# Patient Record
Sex: Male | Born: 1950
Health system: Southern US, Community
[De-identification: ages and names within clinical notes are randomized; demographics above are authoritative.]

## PROBLEM LIST (undated history)

## (undated) DIAGNOSIS — C61 Malignant neoplasm of prostate: Secondary | ICD-10-CM

## (undated) DIAGNOSIS — N400 Enlarged prostate without lower urinary tract symptoms: Secondary | ICD-10-CM

## (undated) DIAGNOSIS — I10 Essential (primary) hypertension: Secondary | ICD-10-CM

## (undated) DIAGNOSIS — M1990 Unspecified osteoarthritis, unspecified site: Secondary | ICD-10-CM

## (undated) DIAGNOSIS — K432 Incisional hernia without obstruction or gangrene: Secondary | ICD-10-CM

## (undated) HISTORY — DX: Malignant neoplasm of prostate: C61

## (undated) HISTORY — PX: HERNIA REPAIR: SHX51

## (undated) HISTORY — DX: Essential (primary) hypertension: I10

## (undated) HISTORY — PX: UMBILICAL HERNIA REPAIR: SHX196

## (undated) HISTORY — DX: Benign prostatic hyperplasia without lower urinary tract symptoms: N40.0

## (undated) HISTORY — PX: INGUINAL HERNIA REPAIR: SUR1180

---

## 2009-12-29 DIAGNOSIS — N400 Enlarged prostate without lower urinary tract symptoms: Secondary | ICD-10-CM

## 2009-12-29 HISTORY — DX: Benign prostatic hyperplasia without lower urinary tract symptoms: N40.0

## 2013-08-05 ENCOUNTER — Encounter: Payer: Self-pay | Admitting: Internal Medicine

## 2013-08-05 ENCOUNTER — Ambulatory Visit (INDEPENDENT_AMBULATORY_CARE_PROVIDER_SITE_OTHER): Payer: BC Managed Care – PPO | Admitting: Internal Medicine

## 2013-08-05 ENCOUNTER — Other Ambulatory Visit (INDEPENDENT_AMBULATORY_CARE_PROVIDER_SITE_OTHER): Payer: BC Managed Care – PPO

## 2013-08-05 VITALS — BP 116/72 | HR 67 | Temp 98.2°F | Resp 16 | Ht 70.0 in | Wt 203.0 lb

## 2013-08-05 DIAGNOSIS — Z0000 Encounter for general adult medical examination without abnormal findings: Secondary | ICD-10-CM

## 2013-08-05 DIAGNOSIS — Z23 Encounter for immunization: Secondary | ICD-10-CM

## 2013-08-05 DIAGNOSIS — C61 Malignant neoplasm of prostate: Secondary | ICD-10-CM | POA: Insufficient documentation

## 2013-08-05 DIAGNOSIS — I10 Essential (primary) hypertension: Secondary | ICD-10-CM

## 2013-08-05 DIAGNOSIS — N400 Enlarged prostate without lower urinary tract symptoms: Secondary | ICD-10-CM

## 2013-08-05 LAB — URINALYSIS, ROUTINE W REFLEX MICROSCOPIC
Bilirubin Urine: NEGATIVE
Hgb urine dipstick: NEGATIVE
Ketones, ur: NEGATIVE
Nitrite: NEGATIVE
Total Protein, Urine: NEGATIVE
Urine Glucose: NEGATIVE
pH: 6 (ref 5.0–8.0)

## 2013-08-05 LAB — COMPREHENSIVE METABOLIC PANEL
Albumin: 4.4 g/dL (ref 3.5–5.2)
Alkaline Phosphatase: 64 U/L (ref 39–117)
BUN: 17 mg/dL (ref 6–23)
CO2: 29 mEq/L (ref 19–32)
Calcium: 9.7 mg/dL (ref 8.4–10.5)
Chloride: 104 mEq/L (ref 96–112)
Glucose, Bld: 96 mg/dL (ref 70–99)
Potassium: 4.5 mEq/L (ref 3.5–5.1)
Sodium: 140 mEq/L (ref 135–145)
Total Protein: 7.2 g/dL (ref 6.0–8.3)

## 2013-08-05 LAB — LIPID PANEL
Cholesterol: 175 mg/dL (ref 0–200)
LDL Cholesterol: 117 mg/dL — ABNORMAL HIGH (ref 0–99)
Total CHOL/HDL Ratio: 4
Triglycerides: 46 mg/dL (ref 0.0–149.0)
VLDL: 9.2 mg/dL (ref 0.0–40.0)

## 2013-08-05 LAB — CBC WITH DIFFERENTIAL/PLATELET
Eosinophils Absolute: 0.1 10*3/uL (ref 0.0–0.7)
Eosinophils Relative: 1.2 % (ref 0.0–5.0)
Lymphocytes Relative: 13.1 % (ref 12.0–46.0)
MCV: 87.5 fl (ref 78.0–100.0)
Monocytes Absolute: 0.5 10*3/uL (ref 0.1–1.0)
Neutrophils Relative %: 77.8 % — ABNORMAL HIGH (ref 43.0–77.0)
Platelets: 189 10*3/uL (ref 150.0–400.0)
WBC: 6.8 10*3/uL (ref 4.5–10.5)

## 2013-08-05 LAB — HEPATITIS C ANTIBODY: HCV Ab: NEGATIVE

## 2013-08-05 MED ORDER — AMLODIPINE BESYLATE 10 MG PO TABS: 10.0000 mg | ORAL_TABLET | Freq: Every day | ORAL | Status: DC

## 2013-08-05 MED ORDER — BENAZEPRIL HCL 20 MG PO TABS: 20.0000 mg | ORAL_TABLET | Freq: Every day | ORAL | Status: DC

## 2013-08-05 MED ORDER — HYDROCHLOROTHIAZIDE 25 MG PO TABS: 25.0000 mg | ORAL_TABLET | Freq: Every day | ORAL | Status: DC

## 2013-08-05 NOTE — Progress Notes (Signed)
Subjective:    Patient ID: Corey [name], male    DOB: 09-12-1951, 62 y.o.   MRN: [id number]  HPI Comments: New to me for a complete physical. His prior care was delivered in [place] - no old records are available today.  Hypertension This is a chronic problem. The current episode started more than 1 year ago. The problem has been gradually improving since onset. The problem is controlled. Pertinent negatives include no anxiety, blurred vision, chest pain, headaches, malaise/fatigue, neck pain, orthopnea, palpitations, peripheral edema, PND, shortness of breath or sweats. There are no associated agents to hypertension. Past treatments include ACE inhibitors, calcium channel blockers and diuretics. The current treatment provides significant improvement. There are no compliance problems.       Review of Systems  Constitutional: Negative.  Negative for fever, chills, malaise/fatigue, diaphoresis, activity change, appetite change, fatigue and unexpected weight change.  HENT: Negative.  Negative for neck pain.   Eyes: Negative.  Negative for blurred vision.  Respiratory: Negative.  Negative for apnea, cough, choking, chest tightness, shortness of breath, wheezing and stridor.   Cardiovascular: Negative.  Negative for chest pain, palpitations, orthopnea, leg swelling and PND.  Gastrointestinal: Negative.  Negative for nausea, vomiting, abdominal pain, diarrhea and constipation.  Endocrine: Negative.   Genitourinary: Negative.  Negative for dysuria, urgency, frequency, hematuria, flank pain, decreased urine volume, discharge, penile swelling, scrotal swelling, enuresis, difficulty urinating, genital sores, penile pain and testicular pain.  Musculoskeletal: Negative.   Skin: Negative.   Allergic/Immunologic: Negative.   Neurological: Negative.  Negative for dizziness and headaches.  Hematological: Negative.  Negative for adenopathy. Does not bruise/bleed easily.  Psychiatric/Behavioral: Negative.         Objective:   Physical Exam  Vitals reviewed. Constitutional: He is oriented to person, place, and time. He appears well-developed and well-nourished. No distress.  HENT:  Head: Normocephalic and atraumatic.  Mouth/Throat: Oropharynx is clear and moist. No oropharyngeal exudate.  Eyes: Conjunctivae are normal. Right eye exhibits no discharge. Left eye exhibits no discharge. No scleral icterus.  Neck: Normal range of motion. Neck supple. No JVD present. No tracheal deviation present. No thyromegaly present.  Cardiovascular: Normal rate, regular rhythm, normal heart sounds and intact distal pulses.  Exam reveals no gallop and no friction rub.   No murmur heard. Pulmonary/Chest: Effort normal and breath sounds normal. No stridor. No respiratory distress. He has no wheezes. He has no rales. He exhibits no tenderness.  Abdominal: Soft. Bowel sounds are normal. He exhibits no distension and no mass. There is no tenderness. There is no rebound and no guarding. Hernia confirmed negative in the right inguinal area and confirmed negative in the left inguinal area.  Genitourinary: Rectum normal, testes normal and penis normal. Rectal exam shows no external hemorrhoid, no internal hemorrhoid, no fissure, no mass, no tenderness and anal tone normal. Guaiac negative stool. Prostate is enlarged (2+ smooth symm BPH). Prostate is not tender. Right testis shows no mass, no swelling and no tenderness. Right testis is descended. Left testis shows no mass, no swelling and no tenderness. Left testis is descended. Circumcised. No penile erythema or penile tenderness. No discharge found.  Musculoskeletal: Normal range of motion. He exhibits no edema and no tenderness.  Lymphadenopathy:    He has no cervical adenopathy.       Right: No inguinal adenopathy present.       Left: No inguinal adenopathy present.  Neurological: He is oriented to person, place, and time.  Skin: Skin is warm and  dry. No rash noted. He  is not diaphoretic. No erythema. No pallor.  Psychiatric: He has a normal mood and affect. His behavior is normal. Judgment and thought content normal.      No results found for this basename: WBC, HGB, HCT, PLT, GLUCOSE, CHOL, TRIG, HDL, LDLDIRECT, LDLCALC, ALT, AST, NA, K, CL, CREATININE, BUN, CO2, TSH, PSA, INR, GLUF, HGBA1C, MICROALBUR       Assessment & Plan:

## 2013-08-05 NOTE — Assessment & Plan Note (Signed)
I will recheck his PSA today and will advise further if needed

## 2013-08-05 NOTE — Assessment & Plan Note (Signed)
He will have records sent from his prior providers He was referred for a colonoscopy Vaccines were updated Labs ordered Pt ed material was given

## 2013-08-05 NOTE — Assessment & Plan Note (Signed)
His BP is well controlled Today I will check his lytes and renal fucntion

## 2013-08-05 NOTE — Patient Instructions (Addendum)
Hypertension As your heart beats, it forces blood through your arteries. This force is your blood pressure. If the pressure is too high, it is called hypertension (HTN) or high blood pressure. HTN is dangerous because you may have it and not know it. High blood pressure may mean that your heart has to work harder to pump blood. Your arteries may be narrow or stiff. The extra work puts you at risk for heart disease, stroke, and other problems.  Blood pressure consists of two numbers, a higher number over a lower, 110/72, for example. It is stated as "110 over 72." The ideal is below 120 for the top number (systolic) and under 80 for the bottom (diastolic). Write down your blood pressure today. You should pay close attention to your blood pressure if you have certain conditions such as:  Heart failure.  Prior heart attack.  Diabetes  Chronic kidney disease.  Prior stroke.  Multiple risk factors for heart disease. To see if you have HTN, your blood pressure should be measured while you are seated with your arm held at the level of the heart. It should be measured at least twice. A one-time elevated blood pressure reading (especially in the Emergency Department) does not mean that you need treatment. There may be conditions in which the blood pressure is different between your right and left arms. It is important to see your caregiver soon for a recheck. Most people have essential hypertension which means that there is not a specific cause. This type of high blood pressure may be lowered by changing lifestyle factors such as:  Stress.  Smoking.  Lack of exercise.  Excessive weight.  Drug/tobacco/alcohol use.  Eating less salt. Most people do not have symptoms from high blood pressure until it has caused damage to the body. Effective treatment can often prevent, delay or reduce that damage. TREATMENT  When a cause has been identified, treatment for high blood pressure is directed at the  cause. There are a large number of medications to treat HTN. These fall into several categories, and your caregiver will help you select the medicines that are best for you. Medications may have side effects. You should review side effects with your caregiver. If your blood pressure stays high after you have made lifestyle changes or started on medicines,   Your medication(s) may need to be changed.  Other problems may need to be addressed.  Be certain you understand your prescriptions, and know how and when to take your medicine.  Be sure to follow up with your caregiver within the time frame advised (usually within two weeks) to have your blood pressure rechecked and to review your medications.  If you are taking more than one medicine to lower your blood pressure, make sure you know how and at what times they should be taken. Taking two medicines at the same time can result in blood pressure that is too low. SEEK IMMEDIATE MEDICAL CARE IF:  You develop a severe headache, blurred or changing vision, or confusion.  You have unusual weakness or numbness, or a faint feeling.  You have severe chest or abdominal pain, vomiting, or breathing problems. MAKE SURE YOU:   Understand these instructions.  Will watch your condition.  Will get help right away if you are not doing well or get worse. Document Released: 12/15/2005 Document Revised: 03/08/2012 Document Reviewed: 08/04/2008 [facility] Patient Information 2014 [place]. Health Maintenance, Males A healthy lifestyle and preventative care can promote health and wellness.  Maintain  regular health, dental, and eye exams.  Eat a healthy diet. Foods like vegetables, fruits, whole grains, low-fat dairy products, and lean protein foods contain the nutrients you need without too many calories. Decrease your intake of foods high in solid fats, added sugars, and salt. Get information about a proper diet from your caregiver, if  necessary.  Regular physical exercise is one of the most important things you can do for your health. Most adults should get at least 150 minutes of moderate-intensity exercise (any activity that increases your heart rate and causes you to sweat) each week. In addition, most adults need muscle-strengthening exercises on 2 or more days a week.   Maintain a healthy weight. The body mass index (BMI) is a screening tool to identify possible weight problems. It provides an estimate of body fat based on height and weight. Your caregiver can help determine your BMI, and can help you achieve or maintain a healthy weight. For adults 20 years and older:  A BMI below 18.5 is considered underweight.  A BMI of 18.5 to 24.9 is normal.  A BMI of 25 to 29.9 is considered overweight.  A BMI of 30 and above is considered obese.  Maintain normal blood lipids and cholesterol by exercising and minimizing your intake of saturated fat. Eat a balanced diet with plenty of fruits and vegetables. Blood tests for lipids and cholesterol should begin at age 70 and be repeated every 5 years. If your lipid or cholesterol levels are high, you are over 50, or you are a high risk for heart disease, you may need your cholesterol levels checked more frequently.Ongoing high lipid and cholesterol levels should be treated with medicines, if diet and exercise are not effective.  If you smoke, find out from your caregiver how to quit. If you do not use tobacco, do not start.  If you choose to drink alcohol, do not exceed 2 drinks per day. One drink is considered to be 12 ounces (355 mL) of beer, 5 ounces (148 mL) of wine, or 1.5 ounces (44 mL) of liquor.  Avoid use of street drugs. Do not share needles with anyone. Ask for help if you need support or instructions about stopping the use of drugs.  High blood pressure causes heart disease and increases the risk of stroke. Blood pressure should be checked at least every 1 to 2 years.  Ongoing high blood pressure should be treated with medicines if weight loss and exercise are not effective.  If you are 25 to 62 years old, ask your caregiver if you should take aspirin to prevent heart disease.  Diabetes screening involves taking a blood sample to check your fasting blood sugar level. This should be done once every 3 years, after age 34, if you are within normal weight and without risk factors for diabetes. Testing should be considered at a younger age or be carried out more frequently if you are overweight and have at least 1 risk factor for diabetes.  Colorectal cancer can be detected and often prevented. Most routine colorectal cancer screening begins at the age of 93 and continues through age 76. However, your caregiver may recommend screening at an earlier age if you have risk factors for colon cancer. On a yearly basis, your caregiver may provide home test kits to check for hidden blood in the stool. Use of a small camera at the end of a tube, to directly examine the colon (sigmoidoscopy or colonoscopy), can detect the earliest forms of colorectal  cancer. Talk to your caregiver about this at age 91, when routine screening begins. Direct examination of the colon should be repeated every 5 to 10 years through age 36, unless early forms of pre-cancerous polyps or small growths are found.  Hepatitis C blood testing is recommended for all people born from [birth year] through 1965 and any individual with known risks for hepatitis C.  Healthy men should no longer receive prostate-specific antigen (PSA) blood tests as part of routine cancer screening. Consult with your caregiver about prostate cancer screening.  Testicular cancer screening is not recommended for adolescents or adult males who have no symptoms. Screening includes self-exam, caregiver exam, and other screening tests. Consult with your caregiver about any symptoms you have or any concerns you have about testicular  cancer.  Practice safe sex. Use condoms and avoid high-risk sexual practices to reduce the spread of sexually transmitted infections (STIs).  Use sunscreen with a sun protection factor (SPF) of 30 or greater. Apply sunscreen liberally and repeatedly throughout the day. You should seek shade when your shadow is shorter than you. Protect yourself by wearing long sleeves, pants, a wide-brimmed hat, and sunglasses year round, whenever you are outdoors.  Notify your caregiver of new moles or changes in moles, especially if there is a change in shape or color. Also notify your caregiver if a mole is larger than the size of a pencil eraser.  A one-time screening for abdominal aortic aneurysm (AAA) and surgical repair of large AAAs by sound wave imaging (ultrasonography) is recommended for ages 21 to 13 years who are current or former smokers.  Stay current with your immunizations. Document Released: 06/12/2008 Document Revised: 03/08/2012 Document Reviewed: 05/12/2011 [facility] Patient Information 2014 [place], [place].

## 2013-12-29 DIAGNOSIS — C61 Malignant neoplasm of prostate: Secondary | ICD-10-CM

## 2013-12-29 HISTORY — DX: Malignant neoplasm of prostate: C61

## 2014-07-12 ENCOUNTER — Other Ambulatory Visit: Payer: Self-pay | Admitting: Internal Medicine

## 2014-07-12 NOTE — Telephone Encounter (Signed)
Need follow up appt soon

## 2014-10-10 ENCOUNTER — Other Ambulatory Visit: Payer: Self-pay

## 2015-12-30 HISTORY — PX: TRANSURETHRAL RESECTION OF PROSTATE: SHX73

## 2015-12-30 HISTORY — PX: PROSTATECTOMY: SHX69

## 2016-03-11 LAB — HM COLONOSCOPY: HM Colonoscopy: NORMAL

## 2016-07-11 ENCOUNTER — Encounter: Payer: Self-pay | Admitting: Internal Medicine

## 2016-07-11 ENCOUNTER — Ambulatory Visit (INDEPENDENT_AMBULATORY_CARE_PROVIDER_SITE_OTHER): Payer: PRIVATE HEALTH INSURANCE | Admitting: Internal Medicine

## 2016-07-11 ENCOUNTER — Telehealth: Payer: Self-pay | Admitting: Internal Medicine

## 2016-07-11 ENCOUNTER — Other Ambulatory Visit (INDEPENDENT_AMBULATORY_CARE_PROVIDER_SITE_OTHER): Payer: PRIVATE HEALTH INSURANCE

## 2016-07-11 VITALS — BP 124/70 | HR 70 | Temp 98.4°F | Ht 70.0 in | Wt 199.2 lb

## 2016-07-11 DIAGNOSIS — I10 Essential (primary) hypertension: Secondary | ICD-10-CM

## 2016-07-11 DIAGNOSIS — E785 Hyperlipidemia, unspecified: Secondary | ICD-10-CM | POA: Diagnosis not present

## 2016-07-11 DIAGNOSIS — C61 Malignant neoplasm of prostate: Secondary | ICD-10-CM

## 2016-07-11 DIAGNOSIS — Z0000 Encounter for general adult medical examination without abnormal findings: Secondary | ICD-10-CM

## 2016-07-11 LAB — CBC WITH DIFFERENTIAL/PLATELET
BASOS ABS: 0 10*3/uL (ref 0.0–0.1)
Basophils Relative: 0.4 % (ref 0.0–3.0)
Eosinophils Absolute: 0.2 10*3/uL (ref 0.0–0.7)
Eosinophils Relative: 3.3 % (ref 0.0–5.0)
HEMATOCRIT: 43.3 % (ref 39.0–52.0)
Hemoglobin: 15 g/dL (ref 13.0–17.0)
LYMPHS PCT: 9.2 % — AB (ref 12.0–46.0)
Lymphs Abs: 0.5 10*3/uL — ABNORMAL LOW (ref 0.7–4.0)
MCHC: 34.6 g/dL (ref 30.0–36.0)
MCV: 86.8 fl (ref 78.0–100.0)
MONOS PCT: 8.4 % (ref 3.0–12.0)
Monocytes Absolute: 0.5 10*3/uL (ref 0.1–1.0)
NEUTROS ABS: 4.6 10*3/uL (ref 1.4–7.7)
Neutrophils Relative %: 78.7 % — ABNORMAL HIGH (ref 43.0–77.0)
PLATELETS: 168 10*3/uL (ref 150.0–400.0)
RBC: 4.98 Mil/uL (ref 4.22–5.81)
RDW: 13.1 % (ref 11.5–15.5)
WBC: 5.9 10*3/uL (ref 4.0–10.5)

## 2016-07-11 LAB — TSH: TSH: 1.49 u[IU]/mL (ref 0.35–4.50)

## 2016-07-11 LAB — COMPREHENSIVE METABOLIC PANEL
ALT: 31 U/L (ref 0–53)
AST: 84 U/L — ABNORMAL HIGH (ref 0–37)
Albumin: 4.5 g/dL (ref 3.5–5.2)
Alkaline Phosphatase: 81 U/L (ref 39–117)
BILIRUBIN TOTAL: 0.8 mg/dL (ref 0.2–1.2)
BUN: 20 mg/dL (ref 6–23)
CALCIUM: 9.7 mg/dL (ref 8.4–10.5)
CHLORIDE: 102 meq/L (ref 96–112)
CO2: 30 meq/L (ref 19–32)
Creatinine, Ser: 0.88 mg/dL (ref 0.40–1.50)
GFR: 92.39 mL/min (ref >60.00)
Glucose, Bld: 98 mg/dL (ref 70–99)
Potassium: 4.4 mEq/L (ref 3.5–5.1)
Sodium: 140 mEq/L (ref 135–145)
Total Protein: 7.2 g/dL (ref 6.0–8.3)

## 2016-07-11 LAB — LIPID PANEL
Cholesterol: 200 mg/dL (ref 0–200)
HDL: 77.7 mg/dL (ref >39.00)
LDL CALC: 114 mg/dL — AB (ref 0–99)
NonHDL: 122.53
TRIGLYCERIDES: 45 mg/dL (ref 0.0–149.0)
Total CHOL/HDL Ratio: 3
VLDL: 9 mg/dL (ref 0.0–40.0)

## 2016-07-11 LAB — PSA: PSA: 0 ng/mL — ABNORMAL LOW (ref 0.10–4.00)

## 2016-07-11 MED ORDER — BENAZEPRIL HCL 20 MG PO TABS: 20.0000 mg | ORAL_TABLET | Freq: Every day | ORAL | Status: DC

## 2016-07-11 MED ORDER — AMLODIPINE BESYLATE 10 MG PO TABS: 10.0000 mg | ORAL_TABLET | Freq: Every day | ORAL | Status: DC

## 2016-07-11 MED ORDER — HYDROCHLOROTHIAZIDE 25 MG PO TABS: 25.0000 mg | ORAL_TABLET | Freq: Every day | ORAL | Status: DC

## 2016-07-11 NOTE — Patient Instructions (Signed)
Hypertension Hypertension, commonly called high blood pressure, is when the force of blood pumping through your arteries is too strong. Your arteries are the blood vessels that carry blood from your heart throughout your body. A blood pressure reading consists of a higher number over a lower number, such as 110/72. The higher number (systolic) is the pressure inside your arteries when your heart pumps. The lower number (diastolic) is the pressure inside your arteries when your heart relaxes. Ideally you want your blood pressure below 120/80. Hypertension forces your heart to work harder to pump blood. Your arteries may become narrow or stiff. Having untreated or uncontrolled hypertension can cause heart attack, stroke, kidney disease, and other problems. RISK FACTORS Some risk factors for high blood pressure are controllable. Others are not.  Risk factors you cannot control include:   Race. You may be at higher risk if you are African American.  Age. Risk increases with age.  Gender. Men are at higher risk than women before age 45 years. After age 65, women are at higher risk than men. Risk factors you can control include:  Not getting enough exercise or physical activity.  Being overweight.  Getting too much fat, sugar, calories, or salt in your diet.  Drinking too much alcohol. SIGNS AND SYMPTOMS Hypertension does not usually cause signs or symptoms. Extremely high blood pressure (hypertensive crisis) may cause headache, anxiety, shortness of breath, and nosebleed. DIAGNOSIS To check if you have hypertension, your health care provider will measure your blood pressure while you are seated, with your arm held at the level of your heart. It should be measured at least twice using the same arm. Certain conditions can cause a difference in blood pressure between your right and left arms. A blood pressure reading that is higher than normal on one occasion does not mean that you need treatment. If  it is not clear whether you have high blood pressure, you may be asked to return on a different day to have your blood pressure checked again. Or, you may be asked to monitor your blood pressure at home for 1 or more weeks. TREATMENT Treating high blood pressure includes making lifestyle changes and possibly taking medicine. Living a healthy lifestyle can help lower high blood pressure. You may need to change some of your habits. Lifestyle changes may include:  Following the DASH diet. This diet is high in fruits, vegetables, and whole grains. It is low in salt, red meat, and added sugars.  Keep your sodium intake below 2,300 mg per day.  Getting at least 30-45 minutes of aerobic exercise at least 4 times per week.  Losing weight if necessary.  Not smoking.  Limiting alcoholic beverages.  Learning ways to reduce stress. Your health care provider may prescribe medicine if lifestyle changes are not enough to get your blood pressure under control, and if one of the following is true:  You are 18-59 years of age and your systolic blood pressure is above 140.  You are 60 years of age or older, and your systolic blood pressure is above 150.  Your diastolic blood pressure is above 90.  You have diabetes, and your systolic blood pressure is over 140 or your diastolic blood pressure is over 90.  You have kidney disease and your blood pressure is above 140/90.  You have heart disease and your blood pressure is above 140/90. Your personal target blood pressure may vary depending on your medical conditions, your age, and other factors. HOME CARE INSTRUCTIONS    Have your blood pressure rechecked as directed by your health care provider.   Take medicines only as directed by your health care provider. Follow the directions carefully. Blood pressure medicines must be taken as prescribed. The medicine does not work as well when you skip doses. Skipping doses also puts you at risk for  problems.  Do not smoke.   Monitor your blood pressure at home as directed by your health care provider. SEEK MEDICAL CARE IF:   You think you are having a reaction to medicines taken.  You have recurrent headaches or feel dizzy.  You have swelling in your ankles.  You have trouble with your vision. SEEK IMMEDIATE MEDICAL CARE IF:  You develop a severe headache or confusion.  You have unusual weakness, numbness, or feel faint.  You have severe chest or abdominal pain.  You vomit repeatedly.  You have trouble breathing. MAKE SURE YOU:   Understand these instructions.  Will watch your condition.  Will get help right away if you are not doing well or get worse.   This information is not intended to replace advice given to you by your health care provider. Make sure you discuss any questions you have with your health care provider.   Document Released: 12/15/2005 Document Revised: 05/01/2015 Document Reviewed: 10/07/2013 [employer]vier Interactive Patient Education 2016 [employer]  

## 2016-07-11 NOTE — Progress Notes (Signed)
LVM for pt to call back as soon as possible.   

## 2016-07-11 NOTE — Telephone Encounter (Signed)
Did you call patient.  States got a missed call at 12:30.  Patient is requesting you to leave a message in regard if you call back.

## 2016-07-11 NOTE — Progress Notes (Signed)
Subjective:  Patient ID: Corey [name], male    DOB: [birth year]  Age: 65 y.o. MRN: [id number]  CC: Annual Exam and Hypertension   HPI Corey [name] presents for a CPX.  I last saw him about 3 years ago. He subsequently moved to [place], [place] where he developed prostate cancer and underwent prostatectomy and was treated with chemotherapy and radiation. He last saw his urologist in [place] about 3 months ago and his PSA was down to 0.01. He comes in today requesting that his PSA be repeated. He is hoping that he gets an undetectable level.  He tells me his blood pressure has been well controlled with the combination of amlodipine, ACEI, and hydrochlorothiazide. He is very active and denies chest pain, shortness of breath, DOE, palpitations, edema, fatigue, or near-syncope.  Outpatient Prescriptions Prior to Visit  Medication Sig Dispense Refill  . amLODipine (NORVASC) 10 MG tablet TAKE 1 TABLET BY MOUTH DAILY. 90 tablet 0  . benazepril (LOTENSIN) 20 MG tablet TAKE 1 TABLET BY MOUTH DAILY. 90 tablet 0  . hydrochlorothiazide (HYDRODIURIL) 25 MG tablet TAKE 1 TABLET BY MOUTH DAILY. 90 tablet 0   No facility-administered medications prior to visit.    ROS Review of Systems  Constitutional: Negative.  Negative for appetite change and fatigue.  HENT: Negative.   Eyes: Negative.   Respiratory: Negative.  Negative for cough, choking, chest tightness, shortness of breath and stridor.   Cardiovascular: Negative.  Negative for chest pain, palpitations and leg swelling.  Gastrointestinal: Negative.  Negative for nausea, vomiting, abdominal pain, diarrhea and constipation.  Endocrine: Negative.   Genitourinary: Positive for enuresis. Negative for frequency, hematuria and difficulty urinating.       He developed mild urinary incontinence after the prostatectomy  Musculoskeletal: Negative.  Negative for myalgias, back pain and neck pain.  Skin: Negative.  Negative for color change and rash.    Neurological: Negative.  Negative for dizziness and weakness.  Hematological: Negative.  Negative for adenopathy. Does not bruise/bleed easily.  Psychiatric/Behavioral: Negative.     Objective:  BP 124/70 mmHg  Pulse 70  Temp(Src) 98.4 F (36.9 C) (Oral)  Ht 5' 10"  (1.778 m)  Wt 199 lb 4 oz (90.379 kg)  BMI 28.59 kg/m2  SpO2 97%  BP Readings from Last 3 Encounters:  07/11/16 124/70  08/05/13 116/72    Wt Readings from Last 3 Encounters:  07/11/16 199 lb 4 oz (90.379 kg)  08/05/13 203 lb (92.08 kg)    Physical Exam  Constitutional: He is oriented to person, place, and time. No distress.  HENT:  Mouth/Throat: Oropharynx is clear and moist. No oropharyngeal exudate.  Eyes: Conjunctivae are normal. Right eye exhibits no discharge. Left eye exhibits no discharge. No scleral icterus.  Neck: Normal range of motion. Neck supple. No JVD present. No tracheal deviation present. No thyromegaly present.  Cardiovascular: Normal rate, regular rhythm, normal heart sounds and intact distal pulses.  Exam reveals no gallop and no friction rub.   No murmur heard. Pulmonary/Chest: Effort normal and breath sounds normal. No stridor. No respiratory distress. He has no wheezes. He has no rales. He exhibits no tenderness.  Abdominal: Soft. Bowel sounds are normal. He exhibits no distension and no mass. There is no tenderness. There is no rebound and no guarding.  Genitourinary: Circumcised.  GU and rectal exams were deferred at his request since he saw his urologist about 3 months ago and had a colonoscopy about 4 months ago  Musculoskeletal: Normal range of motion.  He exhibits no edema or tenderness.  Lymphadenopathy:    He has no cervical adenopathy.  Neurological: He is oriented to person, place, and time.  Skin: Skin is warm and dry. No rash noted. He is not diaphoretic. No erythema. No pallor.  Vitals reviewed.   Lab Results  Component Value Date   WBC 5.9 07/11/2016   HGB 15.0  07/11/2016   HCT 43.3 07/11/2016   PLT 168.0 07/11/2016   GLUCOSE 98 07/11/2016   CHOL 200 07/11/2016   TRIG 45.0 07/11/2016   HDL 77.70 07/11/2016   LDLCALC 114* 07/11/2016   ALT 31 07/11/2016   AST 84* 07/11/2016   NA 140 07/11/2016   K 4.4 07/11/2016   CL 102 07/11/2016   CREATININE 0.88 07/11/2016   BUN 20 07/11/2016   CO2 30 07/11/2016   TSH 1.49 07/11/2016   PSA 0.00* 07/11/2016    Patient was never admitted.  Assessment & Plan:   Corey [name] was seen today for annual exam and hypertension.  Diagnoses and all orders for this visit:  Essential hypertension, benign- his blood pressure is adequately well-controlled, electrolytes and renal function are WNL. -     Comprehensive metabolic panel; Future -     CBC with Differential/Platelet; Future -     amLODipine (NORVASC) 10 MG tablet; Take 1 tablet (10 mg total) by mouth daily. -     benazepril (LOTENSIN) 20 MG tablet; Take 1 tablet (20 mg total) by mouth daily. -     hydrochlorothiazide (HYDRODIURIL) 25 MG tablet; Take 1 tablet (25 mg total) by mouth daily.  Prostate cancer ([facility])- his PSA is undetectable, indicating no recurrence of prostate cancer. -     PSA; Future  Hyperlipidemia with target LDL less than 130- his Framingham risk score is 8%, he has achieved his LDL goal and at this time is not interested in starting statin therapy. -     Lipid panel; Future -     TSH; Future  I have changed Corey [name]'s amLODipine, benazepril, and hydrochlorothiazide. I am also having him maintain his multivitamin and Naproxen Sodium (ALEVE PO).  Meds ordered this encounter  Medications  . Multiple Vitamin (MULTIVITAMIN) tablet    Sig: Take 1 tablet by mouth daily.  . Naproxen Sodium (ALEVE PO)    Sig: Take by mouth.  Marland Kitchen amLODipine (NORVASC) 10 MG tablet    Sig: Take 1 tablet (10 mg total) by mouth daily.    Dispense:  90 tablet    Refill:  1  . benazepril (LOTENSIN) 20 MG tablet    Sig: Take 1 tablet (20 mg total) by mouth  daily.    Dispense:  90 tablet    Refill:  1  . hydrochlorothiazide (HYDRODIURIL) 25 MG tablet    Sig: Take 1 tablet (25 mg total) by mouth daily.    Dispense:  90 tablet    Refill:  1     Follow-up: Return in about 6 months (around 01/11/2017).  Scarlette Calico, MD

## 2016-07-11 NOTE — Telephone Encounter (Signed)
Left detailed message. Noted in labs tab as well.

## 2016-07-12 ENCOUNTER — Encounter: Payer: Self-pay | Admitting: Internal Medicine

## 2016-07-12 NOTE — Assessment & Plan Note (Signed)
Exam completed. Labs ordered and reviewed. His colonoscopy is up-to-date. Vaccines were reviewed. Patient education material was given.

## 2016-09-23 ENCOUNTER — Ambulatory Visit (INDEPENDENT_AMBULATORY_CARE_PROVIDER_SITE_OTHER): Payer: Medicare HMO | Admitting: Internal Medicine

## 2016-09-23 ENCOUNTER — Encounter: Payer: Self-pay | Admitting: Internal Medicine

## 2016-09-23 VITALS — BP 130/76 | HR 72 | Temp 98.3°F | Resp 16 | Ht 70.0 in | Wt 199.8 lb

## 2016-09-23 DIAGNOSIS — I10 Essential (primary) hypertension: Secondary | ICD-10-CM

## 2016-09-23 DIAGNOSIS — K439 Ventral hernia without obstruction or gangrene: Secondary | ICD-10-CM | POA: Diagnosis not present

## 2016-09-23 MED ORDER — HYDROCHLOROTHIAZIDE 25 MG PO TABS: 25.0000 mg | ORAL_TABLET | Freq: Every day | ORAL | 1 refills | Status: DC

## 2016-09-23 MED ORDER — AMLODIPINE BESYLATE 10 MG PO TABS: 10.0000 mg | ORAL_TABLET | Freq: Every day | ORAL | 1 refills | Status: DC

## 2016-09-23 MED ORDER — BENAZEPRIL HCL 20 MG PO TABS: 20.0000 mg | ORAL_TABLET | Freq: Every day | ORAL | 1 refills | Status: DC

## 2016-09-23 NOTE — Patient Instructions (Signed)
Hernia, Adult A hernia is the bulging of an organ or tissue through a weak spot in the muscles of the abdomen (abdominal wall). Hernias develop most often near the navel or groin. There are many kinds of hernias. Common kinds include:  Femoral hernia. This kind of hernia develops under the groin in the upper thigh area.  Inguinal hernia. This kind of hernia develops in the groin or scrotum.  Umbilical hernia. This kind of hernia develops near the navel.  Hiatal hernia. This kind of hernia causes part of the stomach to be pushed up into the chest.  Incisional hernia. This kind of hernia bulges through a scar from an abdominal surgery. CAUSES This condition may be caused by:  Heavy lifting.  Coughing over a long period of time.  Straining to have a bowel movement.  An incision made during an abdominal surgery.  A birth defect (congenital defect).  Excess weight or obesity.  Smoking.  Poor nutrition.  Cystic fibrosis.  Excess fluid in the abdomen.  Undescended testicles. SYMPTOMS Symptoms of a hernia include:  A lump on the abdomen. This is the first sign of a hernia. The lump may become more obvious with standing, straining, or coughing. It may get bigger over time if it is not treated or if the condition causing it is not treated.  Pain. A hernia is usually painless, but it may become painful over time if treatment is delayed. The pain is usually dull and may get worse with standing or lifting heavy objects. Sometimes a hernia gets tightly squeezed in the weak spot (strangulated) or stuck there (incarcerated) and causes additional symptoms. These symptoms may include:  Vomiting.  Nausea.  Constipation.  Irritability. DIAGNOSIS A hernia may be diagnosed with:  A physical exam. During the exam your health care provider may ask you to cough or to make a specific movement, because a hernia is usually more visible when you move.  Imaging tests. These can  include:  X-rays.  Ultrasound.  CT scan. TREATMENT A hernia that is small and painless may not need to be treated. A hernia that is large or painful may be treated with surgery. Inguinal hernias may be treated with surgery to prevent incarceration or strangulation. Strangulated hernias are always treated with surgery, because lack of blood to the trapped organ or tissue can cause it to die. Surgery to treat a hernia involves pushing the bulge back into place and repairing the weak part of the abdomen. HOME CARE INSTRUCTIONS  Avoid straining.  Do not lift anything heavier than 10 lb (4.5 kg).  Lift with your leg muscles, not your back muscles. This helps avoid strain.  When coughing, try to cough gently.  Prevent constipation. Constipation leads to straining with bowel movements, which can make a hernia worse or cause a hernia repair to break down. You can prevent constipation by:  Eating a high-fiber diet that includes plenty of fruits and vegetables.  Drinking enough fluids to keep your urine clear or pale yellow. Aim to drink 6-8 glasses of water per day.  Using a stool softener as directed by your health care provider.  Lose weight, if you are overweight.  Do not use any tobacco products, including cigarettes, chewing tobacco, or electronic cigarettes. If you need help quitting, ask your health care provider.  Keep all follow-up visits as directed by your health care provider. This is important. Your health care provider may need to monitor your condition. SEEK MEDICAL CARE IF:  You have   swelling, redness, and pain in the affected area.  Your bowel habits change. SEEK IMMEDIATE MEDICAL CARE IF:  You have a fever.  You have abdominal pain that is getting worse.  You feel nauseous or you vomit.  You cannot push the hernia back in place by gently pressing on it while you are lying down.  The hernia:  Changes in shape or size.  Is stuck outside the  abdomen.  Becomes discolored.  Feels hard or tender.   This information is not intended to replace advice given to you by your health care provider. Make sure you discuss any questions you have with your health care provider.   Document Released: 12/15/2005 Document Revised: 01/05/2015 Document Reviewed: 10/25/2014 Elsevier Interactive Patient Education 2016 [employer]  

## 2016-09-23 NOTE — Progress Notes (Signed)
Pre visit review using our clinic review tool, if applicable. No additional management support is needed unless otherwise documented below in the visit note. 

## 2016-09-23 NOTE — Progress Notes (Signed)
Subjective:  Patient ID: Corey [name], male    DOB: 12/27/1951  Age: 65 y.o. MRN: [id number]  CC: Abdominal Pain and Hypertension   HPI Corey [name] presents for concerns about an intermittent painful bulge in the middle of his abdomen. He had a ventral hernia repaired with mesh about 5 years ago in [place]. He complains that 3 times over the last 6 months, while doing heavy lifting and straining, he has noticed a hernia bulging just above his belly button. When he lays down flat the bulging goes away. He does not have any trouble swallowing and denies dysphagia, odynophagia, weight loss, or loss of appetite.  Outpatient Medications Prior to Visit  Medication Sig Dispense Refill  . Multiple Vitamin (MULTIVITAMIN) tablet Take 1 tablet by mouth daily.    . Naproxen Sodium (ALEVE PO) Take by mouth.    Marland Kitchen amLODipine (NORVASC) 10 MG tablet Take 1 tablet (10 mg total) by mouth daily. 90 tablet 1  . benazepril (LOTENSIN) 20 MG tablet Take 1 tablet (20 mg total) by mouth daily. 90 tablet 1  . hydrochlorothiazide (HYDRODIURIL) 25 MG tablet Take 1 tablet (25 mg total) by mouth daily. 90 tablet 1   No facility-administered medications prior to visit.     ROS Review of Systems  Constitutional: Negative.  Negative for appetite change, diaphoresis, fatigue and unexpected weight change.  HENT: Negative.  Negative for trouble swallowing and voice change.   Eyes: Negative.  Negative for visual disturbance.  Respiratory: Negative.  Negative for cough, choking, shortness of breath and stridor.   Cardiovascular: Negative for chest pain and leg swelling.  Gastrointestinal: Positive for abdominal pain. Negative for blood in stool, constipation, diarrhea, nausea and vomiting.  Endocrine: Negative.   Genitourinary: Negative.  Negative for difficulty urinating.  Musculoskeletal: Negative for arthralgias, back pain, myalgias and neck pain.  Skin: Negative.  Negative for color change and rash.    Allergic/Immunologic: Negative.   Neurological: Negative.  Negative for dizziness, weakness and light-headedness.  Hematological: Negative.  Negative for adenopathy. Does not bruise/bleed easily.  Psychiatric/Behavioral: Negative.     Objective:  BP 130/76 (BP Location: Left Arm, Patient Position: Sitting, Cuff Size: Normal)   Pulse 72   Temp 98.3 F (36.8 C) (Oral)   Resp 16   Ht 5' 10"  (1.778 m)   Wt 199 lb 12 oz (90.6 kg)   SpO2 97%   BMI 28.66 kg/m   BP Readings from Last 3 Encounters:  09/23/16 130/76  07/11/16 124/70  08/05/13 116/72    Wt Readings from Last 3 Encounters:  09/23/16 199 lb 12 oz (90.6 kg)  07/11/16 199 lb 4 oz (90.4 kg)  08/05/13 203 lb (92.1 kg)    Physical Exam  Constitutional: He is oriented to person, place, and time. No distress.  HENT:  Mouth/Throat: Oropharynx is clear and moist. No oropharyngeal exudate.  Eyes: Conjunctivae are normal. Right eye exhibits no discharge. Left eye exhibits no discharge. No scleral icterus.  Neck: Normal range of motion. Neck supple. No JVD present. No tracheal deviation present. No thyromegaly present.  Cardiovascular: Normal rate, regular rhythm, normal heart sounds and intact distal pulses.  Exam reveals no gallop and no friction rub.   No murmur heard. Pulmonary/Chest: Effort normal and breath sounds normal. No stridor. No respiratory distress. He has no wheezes. He has no rales. He exhibits no tenderness.  Abdominal: Soft. Bowel sounds are normal. He exhibits no distension and no mass. There is no hepatosplenomegaly, splenomegaly or hepatomegaly.  There is no tenderness. There is no rebound, no guarding and no CVA tenderness. A hernia is present. Hernia confirmed positive in the ventral area. Hernia confirmed negative in the right inguinal area and confirmed negative in the left inguinal area.    Musculoskeletal: Normal range of motion. He exhibits no edema, tenderness or deformity.  Lymphadenopathy:    He  has no cervical adenopathy.  Neurological: He is oriented to person, place, and time.  Skin: Skin is warm and dry. No rash noted. He is not diaphoretic. No erythema. No pallor.  Vitals reviewed.   Lab Results  Component Value Date   WBC 5.9 07/11/2016   HGB 15.0 07/11/2016   HCT 43.3 07/11/2016   PLT 168.0 07/11/2016   GLUCOSE 98 07/11/2016   CHOL 200 07/11/2016   TRIG 45.0 07/11/2016   HDL 77.70 07/11/2016   LDLCALC 114 (H) 07/11/2016   ALT 31 07/11/2016   AST 84 (H) 07/11/2016   NA 140 07/11/2016   K 4.4 07/11/2016   CL 102 07/11/2016   CREATININE 0.88 07/11/2016   BUN 20 07/11/2016   CO2 30 07/11/2016   TSH 1.49 07/11/2016   PSA 0.00 (L) 07/11/2016    Patient was never admitted.  Assessment & Plan:   Corey [name] was seen today for abdominal pain and hypertension.  Diagnoses and all orders for this visit:  Essential hypertension, benign- his blood pressure is adequately well controlled, will continue the current combination of an ACE inhibitor plus calcium channel blocker plus thiazide diuretic. -     benazepril (LOTENSIN) 20 MG tablet; Take 1 tablet (20 mg total) by mouth daily. -     amLODipine (NORVASC) 10 MG tablet; Take 1 tablet (10 mg total) by mouth daily. -     hydrochlorothiazide (HYDRODIURIL) 25 MG tablet; Take 1 tablet (25 mg total) by mouth daily.  Ventral hernia without obstruction or gangrene- he will see general surgery to see if this can be repaired -     Ambulatory referral to General Surgery   I am having Corey [name] maintain his multivitamin, Naproxen Sodium (ALEVE PO), benazepril, amLODipine, and hydrochlorothiazide.  Meds ordered this encounter  Medications  . benazepril (LOTENSIN) 20 MG tablet    Sig: Take 1 tablet (20 mg total) by mouth daily.    Dispense:  90 tablet    Refill:  1  . amLODipine (NORVASC) 10 MG tablet    Sig: Take 1 tablet (10 mg total) by mouth daily.    Dispense:  90 tablet    Refill:  1  . hydrochlorothiazide (HYDRODIURIL)  25 MG tablet    Sig: Take 1 tablet (25 mg total) by mouth daily.    Dispense:  90 tablet    Refill:  1     Follow-up: No Follow-up on file.  Scarlette Calico, MD

## 2016-10-17 ENCOUNTER — Ambulatory Visit: Payer: Self-pay | Admitting: Surgery

## 2016-10-17 DIAGNOSIS — K432 Incisional hernia without obstruction or gangrene: Secondary | ICD-10-CM | POA: Diagnosis not present

## 2016-10-17 NOTE — H&P (Signed)
Corey [name] 10/17/2016 9:08 AM Location: [facility] Patient #: [id number] DOB: Jun 01, 1951 Married / Language: Corey [name] / Race: White Male  History of Present Illness Corey [name] A. Corey Vasques MD; 10/17/2016 11:27 AM) Patient words: Patient sent at the request of Dr. Ronnald [name] for chronic recurrent incisional hernia. The patient had a repair in [facility] with mesh 5 years ago. He developed a painful bulge in the incision. This is most noticeable when he stands or strains. He is able to reduce this when he lays down flat pop it back in. Denies nausea or vomiting. His symptoms have been occurring for the last 6 months. There is been no redness or drainage from the old scar.  The patient is a 65 year old male.   Other Problems Corey [name], CMA; 10/17/2016 9:08 AM) High blood pressure Prostate Cancer Umbilical Hernia Repair  Past Surgical History Corey [name], CMA; 10/17/2016 9:08 AM) Colon Polyp Removal - Colonoscopy Open Inguinal Hernia Surgery Right. Prostate Surgery - Removal Ventral / Umbilical Hernia Surgery Bilateral.  Diagnostic Studies History Corey [name], CMA; 10/17/2016 9:08 AM) Colonoscopy within last year  Allergies Corey [name], [place]; 10/17/2016 9:09 AM) No Known Drug Allergies 10/17/2016  Medication History (Corey [name], CMA; 10/17/2016 9:10 AM) AmLODIPine Besylate (10MG Tablet, Oral) Active. Benazepril HCl (20MG Tablet, Oral) Active. HydroCHLOROthiazide (25MG Tablet, Oral) Active. Medications Reconciled  Social History (Corey [name]; 10/17/2016 9:08 AM) Alcohol use Occasional alcohol use. Caffeine use Coffee. No drug use Tobacco use Never smoker.  Family History Corey [name], Corey [name]; 10/17/2016 9:08 AM) Alcohol Abuse Father. Cerebrovascular Accident Mother. Hypertension Mother.     Review of Systems Corey [name] CMA; 10/17/2016 9:08 AM) General Not Present- Appetite Loss, Chills, Fatigue, Fever, Night Sweats,  Weight Gain and Weight Loss. Skin Not Present- Change in Wart/Mole, Dryness, Hives, Jaundice, New Lesions, Non-Healing Wounds, Rash and Ulcer. HEENT Present- Wears glasses/contact lenses. Not Present- Earache, Hearing Loss, Hoarseness, Nose Bleed, Oral Ulcers, Ringing in the Ears, Seasonal Allergies, Sinus Pain, Sore Throat, Visual Disturbances and Yellow Eyes. Respiratory Present- Snoring. Not Present- Bloody sputum, Chronic Cough, Difficulty Breathing and Wheezing. Breast Not Present- Breast Mass, Breast Pain, Nipple Discharge and Skin Changes. Cardiovascular Not Present- Chest Pain, Difficulty Breathing Lying Down, Leg Cramps, Palpitations, Rapid Heart Rate, Shortness of Breath and Swelling of Extremities. Gastrointestinal Not Present- Abdominal Pain, Bloating, Bloody Stool, Change in Bowel Habits, Chronic diarrhea, Constipation, Difficulty Swallowing, Excessive gas, Gets full quickly at meals, Hemorrhoids, Indigestion, Nausea, Rectal Pain and Vomiting. Male Genitourinary Present- Impotence and Urine Leakage. Not Present- Blood in Urine, Change in Urinary Stream, Frequency, Nocturia, Painful Urination and Urgency.  Vitals (Corey [name] CMA; 10/17/2016 9:09 AM) 10/17/2016 9:09 AM Weight: 197 lb Height: 70in Body Surface Area: 2.07 m Body Mass Index: 28.27 kg/m  Temp.: 65F(Temporal)  Pulse: 71 (Regular)  BP: 126/80 (Sitting, Left Arm, Standard)      Physical Exam (Corey [name] Corey A. Kaarin Pardy MD; 10/17/2016 11:28 AM)  General Mental Status-Alert. General Appearance-Consistent with stated age. Hydration-Well hydrated. Voice-Normal.  Head and Neck Head-normocephalic, atraumatic with no lesions or palpable masses. Trachea-midline. Thyroid Gland Characteristics - normal size and consistency.  Chest and Lung Exam Chest and lung exam reveals -quiet, even and easy respiratory effort with no use of accessory muscles and on auscultation, normal breath sounds, no  adventitious sounds and normal vocal resonance. Inspection Chest Wall - Normal. Back - normal.  Cardiovascular Cardiovascular examination reveals -normal heart sounds, regular rate and rhythm with no murmurs and normal pedal pulses bilaterally.  Abdomen Note: Midline scar noted. Recurrent incisional hernia detected which is reducible.  Neurologic Neurologic evaluation reveals -alert and oriented x 3 with no impairment of recent or remote memory. Mental Status-Normal.  Musculoskeletal Normal Exam - Left-Upper Extremity Strength Normal and Lower Extremity Strength Normal. Normal Exam - Right-Upper Extremity Strength Normal and Lower Extremity Strength Normal.    Assessment & Plan (Corey Freyre A. Marnie Fazzino MD; 10/17/2016 11:29 AM)  INCISIONAL HERNIA, WITHOUT OBSTRUCTION OR GANGRENE (K43.2) Impression: Discussed open and laparoscopic approaches with the use of mesh. Recommended a laparoscopic approach to his recurrent incisional hernia with mesh repair. Risks, benefits and alternative therapies discussed. Recommend using a binder for time being. He is unsure when he can get this repaired. I discussed with the symptoms of incarceration strangulation and recommended if these do occur he seek emergent care. He will let me know if and when he wishes repair. The risk of hernia repair include bleeding, infection, organ injury, bowel injury, bladder injury, nerve injury recurrent hernia, blood clots, worsening of underlying condition, chronic pain, mesh use, open surgery, death, and the need for other operattions. Pt agrees to proceed  Current Plans Pt Education - Pamphlet Given - Hernia Surgery: discussed with patient and provided information. The anatomy & physiology of the abdominal wall was discussed. The pathophysiology of hernias was discussed. Natural history risks without surgery including progeressive enlargement, pain, incarceration, & strangulation was discussed. Contributors to  complications such as smoking, obesity, diabetes, prior surgery, etc were discussed.  I feel the risks of no intervention will lead to serious problems that outweigh the operative risks; therefore, I recommended surgery to reduce and repair the hernia. I explained laparoscopic techniques with possible need for an open approach. I noted the probable use of mesh to patch and/or buttress the hernia repair  Risks such as bleeding, infection, abscess, need for further treatment, heart attack, death, and other risks were discussed. I noted a good likelihood this will help address the problem. Goals of post-operative recovery were discussed as well. Possibility that this will not correct all symptoms was explained. I stressed the importance of low-impact activity, aggressive pain control, avoiding constipation, & not pushing through pain to minimize risk of post-operative chronic pain or injury. Possibility of reherniation especially with smoking, obesity, diabetes, immunosuppression, and other health conditions was discussed. We will work to minimize complications.  An educational handout further explaining the pathology & treatment options was given as well. Questions were answered. The patient expresses understanding & wishes to proceed with surgery.  Pt Education - Consent for inguinal hernia - [place]: discussed with patient and provided information. Pt Education - [facility] Laparoscopic [facility] HCI

## 2017-02-23 ENCOUNTER — Encounter: Payer: Self-pay | Admitting: Nurse Practitioner

## 2017-02-23 ENCOUNTER — Ambulatory Visit (INDEPENDENT_AMBULATORY_CARE_PROVIDER_SITE_OTHER)
Admission: RE | Admit: 2017-02-23 | Discharge: 2017-02-23 | Disposition: A | Discharge status: Home / Self Care | Payer: Medicare HMO | Source: Ambulatory Visit | Attending: Nurse Practitioner | Admitting: Nurse Practitioner

## 2017-02-23 ENCOUNTER — Other Ambulatory Visit (INDEPENDENT_AMBULATORY_CARE_PROVIDER_SITE_OTHER): Payer: Medicare HMO

## 2017-02-23 ENCOUNTER — Ambulatory Visit (INDEPENDENT_AMBULATORY_CARE_PROVIDER_SITE_OTHER): Payer: Medicare HMO | Admitting: Nurse Practitioner

## 2017-02-23 VITALS — BP 116/78 | HR 86 | Temp 97.7°F | Wt 195.0 lb

## 2017-02-23 DIAGNOSIS — R0602 Shortness of breath: Secondary | ICD-10-CM | POA: Diagnosis not present

## 2017-02-23 DIAGNOSIS — R5383 Other fatigue: Secondary | ICD-10-CM | POA: Diagnosis not present

## 2017-02-23 DIAGNOSIS — R6883 Chills (without fever): Secondary | ICD-10-CM

## 2017-02-23 DIAGNOSIS — R1013 Epigastric pain: Secondary | ICD-10-CM

## 2017-02-23 DIAGNOSIS — R509 Fever, unspecified: Secondary | ICD-10-CM | POA: Diagnosis not present

## 2017-02-23 LAB — CBC WITH DIFFERENTIAL/PLATELET
BASOS PCT: 0.2 % (ref 0.0–3.0)
Basophils Absolute: 0 10*3/uL (ref 0.0–0.1)
EOS PCT: 1.5 % (ref 0.0–5.0)
Eosinophils Absolute: 0.2 10*3/uL (ref 0.0–0.7)
HCT: 41 % (ref 39.0–52.0)
Hemoglobin: 14.2 g/dL (ref 13.0–17.0)
LYMPHS ABS: 0.5 10*3/uL — AB (ref 0.7–4.0)
Lymphocytes Relative: 5.1 % — ABNORMAL LOW (ref 12.0–46.0)
MCHC: 34.7 g/dL (ref 30.0–36.0)
MCV: 86.6 fl (ref 78.0–100.0)
MONOS PCT: 9.3 % (ref 3.0–12.0)
Monocytes Absolute: 1 10*3/uL (ref 0.1–1.0)
NEUTROS ABS: 8.6 10*3/uL — AB (ref 1.4–7.7)
Platelets: 169 10*3/uL (ref 150.0–400.0)
RBC: 4.74 Mil/uL (ref 4.22–5.81)
RDW: 13.4 % (ref 11.5–15.5)
WBC: 10.3 10*3/uL (ref 4.0–10.5)

## 2017-02-23 LAB — COMPREHENSIVE METABOLIC PANEL
ALK PHOS: 82 U/L (ref 39–117)
ALT: 30 U/L (ref 0–53)
AST: 73 U/L — ABNORMAL HIGH (ref 0–37)
Albumin: 4.1 g/dL (ref 3.5–5.2)
BUN: 23 mg/dL (ref 6–23)
CO2: 29 mEq/L (ref 19–32)
Calcium: 9.4 mg/dL (ref 8.4–10.5)
Chloride: 101 mEq/L (ref 96–112)
Creatinine, Ser: 1.09 mg/dL (ref 0.40–1.50)
GFR: 72.03 mL/min (ref >60.00)
GLUCOSE: 103 mg/dL — AB (ref 70–99)
POTASSIUM: 3.9 meq/L (ref 3.5–5.1)
Sodium: 138 mEq/L (ref 135–145)
TOTAL PROTEIN: 7.5 g/dL (ref 6.0–8.3)
Total Bilirubin: 0.7 mg/dL (ref 0.2–1.2)

## 2017-02-23 LAB — LIPASE: LIPASE: 29 U/L (ref 11.0–59.0)

## 2017-02-23 LAB — AMYLASE: Amylase: 23 U/L — ABNORMAL LOW (ref 27–131)

## 2017-02-23 NOTE — Progress Notes (Signed)
Subjective:  Patient ID: Corey [name], male    DOB: Dec 09, 1951  Age: 66 y.o. MRN: [id number]  CC: Abdominal Pain (pt stated fatique, chills, diziness, painful when coughing 4 days. )   Abdominal Pain  This is a new problem. The current episode started in the past 7 days. The onset quality is sudden. The problem has been gradually worsening. The pain is located in the epigastric region. The quality of the pain is colicky and a sensation of fullness. The abdominal pain does not radiate. Associated symptoms include anorexia. Pertinent negatives include no belching, constipation, diarrhea, dysuria, fever, flatus, frequency, headaches, hematochezia, hematuria, melena, myalgias, nausea, vomiting or weight loss. Exacerbated by: bending forward. The pain is relieved by nothing. He has tried nothing for the symptoms.    Outpatient Medications Prior to Visit  Medication Sig Dispense Refill  . amLODipine (NORVASC) 10 MG tablet Take 1 tablet (10 mg total) by mouth daily. 90 tablet 1  . benazepril (LOTENSIN) 20 MG tablet Take 1 tablet (20 mg total) by mouth daily. 90 tablet 1  . hydrochlorothiazide (HYDRODIURIL) 25 MG tablet Take 1 tablet (25 mg total) by mouth daily. 90 tablet 1  . Multiple Vitamin (MULTIVITAMIN) tablet Take 1 tablet by mouth daily.    . Naproxen Sodium (ALEVE PO) Take by mouth.     No facility-administered medications prior to visit.     ROS See HPI  Objective:  BP 116/78   Pulse 86   Temp 97.7 F (36.5 C)   Wt 195 lb (88.5 kg)   SpO2 96%   BMI 27.98 kg/m   BP Readings from Last 3 Encounters:  02/23/17 116/78  09/23/16 130/76  07/11/16 124/70    Wt Readings from Last 3 Encounters:  02/23/17 195 lb (88.5 kg)  09/23/16 199 lb 12 oz (90.6 kg)  07/11/16 199 lb 4 oz (90.4 kg)    Physical Exam  Constitutional: He is oriented to person, place, and time. No distress.  Neck: Normal range of motion. Neck supple.  Cardiovascular: Normal rate, regular rhythm and normal  heart sounds.  Exam reveals no gallop and no friction rub.   No murmur heard. Pulmonary/Chest: Effort normal and breath sounds normal.  Abdominal: Soft. Bowel sounds are normal. He exhibits no distension. There is no tenderness.  Musculoskeletal: Normal range of motion. He exhibits no edema.  Lymphadenopathy:    He has no cervical adenopathy.  Neurological: He is alert and oriented to person, place, and time.  Skin: Skin is warm and dry. No rash noted. No erythema.  Vitals reviewed.   Lab Results  Component Value Date   WBC 10.3 02/23/2017   HGB 14.2 02/23/2017   HCT 41.0 02/23/2017   PLT 169.0 02/23/2017   GLUCOSE 103 (H) 02/23/2017   CHOL 200 07/11/2016   TRIG 45.0 07/11/2016   HDL 77.70 07/11/2016   LDLCALC 114 (H) 07/11/2016   ALT 30 02/23/2017   AST 73 (H) 02/23/2017   NA 138 02/23/2017   K 3.9 02/23/2017   CL 101 02/23/2017   CREATININE 1.09 02/23/2017   BUN 23 02/23/2017   CO2 29 02/23/2017   TSH 1.49 07/11/2016   PSA 0.00 (L) 07/11/2016   ECG: normal sinus rhythm.  Assessment & Plan:   Corey [name] was seen today for abdominal pain.  Diagnoses and all orders for this visit:  Chills (without fever) -     DG Chest 2 View; Future -     CBC w/Diff; Future -  Comprehensive metabolic panel; Future -     Lipase; Future -     Amylase; Future  SOB (shortness of breath) on exertion -     EKG 12-Lead -     DG Chest 2 View; Future -     CBC w/Diff; Future -     Comprehensive metabolic panel; Future -     Lipase; Future -     Amylase; Future  Epigastric pain -     EKG 12-Lead -     DG Chest 2 View; Future -     CBC w/Diff; Future -     Comprehensive metabolic panel; Future -     Lipase; Future -     Amylase; Future   I am having Corey [name] maintain his multivitamin, Naproxen Sodium (ALEVE PO), benazepril, amLODipine, and hydrochlorothiazide.  No orders of the defined types were placed in this encounter.   Follow-up: Return if symptoms worsen or fail to  improve.  Wilfred Lacy, NP

## 2017-03-02 ENCOUNTER — Ambulatory Visit: Payer: Self-pay | Admitting: Surgery

## 2017-03-02 DIAGNOSIS — K432 Incisional hernia without obstruction or gangrene: Secondary | ICD-10-CM | POA: Diagnosis not present

## 2017-03-02 NOTE — H&P (Signed)
Corey [name] 03/02/2017 10:12 AM Location: [facility] Patient #: [id number] DOB: 04-30-1951 Married / Language: Corey [name] / Race: White Male  History of Present Illness Corey Moores A. Blenda Wisecup MD; 03/02/2017 11:11 AM) Patient words: Patient returns for follow-up of his incisional hernia. He was seen in October did not schedule her work constraints. He is about the same but was to go ahead and get his surgery set up to repair his incisional hernia. He also states some discomfort in his right inguinal region. Denies any left groin pain. Otherwise, he's had no significant change from his previous visit.  The patient is a 66 year old male.   Allergies Corey [name] [place], [place]; 03/02/2017 10:13 AM) No Known Drug Allergies 10/17/2016  Medication History Corey [name], [place]; 03/02/2017 10:13 AM) Multi-Minerals (Oral daily) Active. Aleve (220MG Tablet, Oral as needed) Active. AmLODIPine Besylate (10MG Tablet, Oral) Active. Benazepril HCl (20MG Tablet, Oral) Active. HydroCHLOROthiazide (25MG Tablet, Oral) Active. Medications Reconciled    Vitals Corey [name] Bradford CMA; 03/02/2017 10:13 AM) 03/02/2017 10:13 AM Weight: 199.4 lb Height: 70in Body Surface Area: 2.08 m Body Mass Index: 28.61 kg/m  Temp.: 98.73F  Pulse: 79 (Regular)  BP: 122/78 (Sitting, Left Arm, Standard)      Physical Exam (Corey [name] A. Corey Marty MD; 03/02/2017 11:11 AM)  General Mental Status-Alert. General Appearance-Consistent with stated age. Hydration-Well hydrated. Voice-Normal.  Abdomen Note: Reducible small epigastric incisional hernia. Soft nontender. No evidence of right or left inguinal hernia on exam today.  Neurologic Neurologic evaluation reveals -alert and oriented x 3 with no impairment of recent or remote memory. Mental Status-Normal.  Musculoskeletal Normal Exam - Left-Upper Extremity Strength Normal and Lower Extremity Strength Normal. Normal Exam - Right-Upper Extremity  Strength Normal and Lower Extremity Strength Normal.    Assessment & Plan (Corey Magee A. Hilary Pundt MD; 03/02/2017 11:11 AM)  INCISIONAL HERNIA, WITHOUT OBSTRUCTION OR GANGRENE (K43.2) Impression: Discussed open and laparoscopic approaches with the use of mesh. Recommended a laparoscopic approach to his recurrent incisional hernia with mesh repair. Risks, benefits and alternative therapies discussed. Recommend using a binder for time being. He is unsure when he can get this repaired. I discussed with the symptoms of incarceration strangulation and recommended if these do occur he seek emergent care. He will let me know if and when he wishes repair. The risk of hernia repair include bleeding, infection, organ injury, bowel injury, bladder injury, nerve injury recurrent hernia, blood clots, worsening of underlying condition, chronic pain, mesh use, open surgery, death, and the need for other operattions. Pt agrees to proceed  Current Plans The anatomy & physiology of the abdominal wall was discussed. The pathophysiology of hernias was discussed. Natural history risks without surgery including progeressive enlargement, pain, incarceration, & strangulation was discussed. Contributors to complications such as smoking, obesity, diabetes, prior surgery, etc were discussed.  I feel the risks of no intervention will lead to serious problems that outweigh the operative risks; therefore, I recommended surgery to reduce and repair the hernia. I explained laparoscopic techniques with possible need for an open approach. I noted the probable use of mesh to patch and/or buttress the hernia repair  Risks such as bleeding, infection, abscess, need for further treatment, heart attack, death, and other risks were discussed. I noted a good likelihood this will help address the problem. Goals of post-operative recovery were discussed as well. Possibility that this will not correct all symptoms was explained. I stressed  the importance of low-impact activity, aggressive pain control, avoiding constipation, & not pushing through  pain to minimize risk of post-operative chronic pain or injury. Possibility of reherniation especially with smoking, obesity, diabetes, immunosuppression, and other health conditions was discussed. We will work to minimize complications.  An educational handout further explaining the pathology & treatment options was given as well. Questions were answered. The patient expresses understanding & wishes to proceed with surgery.  You are being scheduled for surgery- Our schedulers will call you.  You should hear from our office's scheduling department within 5 working days about the location, date, and time of surgery. We try to make accommodations for patient's preferences in scheduling surgery, but sometimes the OR schedule or the surgeon's schedule prevents [place] from making those accommodations.  If you have not heard from our office [phone]) in 5 working days, call the office and ask for your surgeon's nurse.  If you have other questions about your diagnosis, plan, or surgery, call the office and ask for your surgeon's nurse.  Pt Education - Pamphlet Given - Hernia Surgery: discussed with patient and provided information.

## 2017-03-09 NOTE — Pre-Procedure Instructions (Signed)
Corey [name]  03/09/2017      [facility] [phone] - [address], [address] [place] [address] Phone: [phone] Fax: [phone]  [id number] [place] [place] [place] [address]Phone: [phone]: [phone]   Your procedure is scheduled on Thursday March 15.  [facility]  Call this number if you have problems the morning of surgery:  [phone]   Remember:  Do not eat food or drink liquids after midnight.  Take these medicines the morning of surgery with A SIP OF WATER: amlodipine (norvasc)  7 days prior to surgery STOP taking any Aspirin, Aleve, Naproxen, Ibuprofen, Motrin, Advil, Goody's, BC's, all herbal medications, fish oil, and all vitamins    Do not wear jewelry  Do not wear lotions, powders, or colognes, or deoderant.  Men may shave face and neck.  Do not bring valuables to the hospital.  [facility].  Contacts, dentures or bridgework may not be worn into surgery.  Leave your suitcase in the car.  After surgery it may be brought to your room.  For patients admitted to the hospital, discharge time will be determined by your treatment team.  Patients discharged the day of surgery will not be allowed to drive home.    Special instructions:    [place]- Preparing For Surgery  Before surgery, you can play an important role. Because skin is not sterile, your skin needs to be as free of germs as possible. You can reduce the number of germs on your skin by washing with CHG (chlorahexidine gluconate) Soap before surgery.  CHG is an antiseptic cleaner which kills germs and bonds with the skin to continue killing germs even after washing.  Please do not use if you have an allergy to CHG or antibacterial soaps. If your skin becomes  reddened/irritated stop using the CHG.  Do not shave (including legs and underarms) for at least 48 hours prior to first CHG shower. It is OK to shave your face.  Please follow these instructions carefully.   1. Shower the NIGHT BEFORE SURGERY and the MORNING OF SURGERY with CHG.   2. If you chose to wash your hair, wash your hair first as usual with your normal shampoo.  3. After you shampoo, rinse your hair and body thoroughly to remove the shampoo.  4. Use CHG as you would any other liquid soap. You can apply CHG directly to the skin and wash gently with a scrungie or a clean washcloth.   5. Apply the CHG Soap to your body ONLY FROM THE NECK DOWN.  Do not use on open wounds or open sores. Avoid contact with your eyes, ears, mouth and genitals (private parts). Wash genitals (private parts) with your normal soap.  6. Wash thoroughly, paying special attention to the area where your surgery will be performed.  7. Thoroughly rinse your body with warm water from the neck down.  8. DO NOT shower/wash with your normal soap after using and rinsing off the CHG Soap.  9. Pat yourself dry with a CLEAN TOWEL.   10. Wear CLEAN PAJAMAS   11. Place CLEAN SHEETS on your bed the night of your first shower and DO NOT SLEEP WITH PETS.    Day of  Surgery: Do not apply any deodorants/lotions. Please wear clean clothes to the hospital/surgery center.

## 2017-03-10 ENCOUNTER — Encounter (HOSPITAL_COMMUNITY): Payer: Self-pay

## 2017-03-10 ENCOUNTER — Encounter (HOSPITAL_COMMUNITY)
Admission: RE | Admit: 2017-03-10 | Discharge: 2017-03-10 | Disposition: A | Discharge status: Home / Self Care | Payer: MEDICARE | Source: Ambulatory Visit | Attending: Surgery | Admitting: Surgery

## 2017-03-10 DIAGNOSIS — K432 Incisional hernia without obstruction or gangrene: Secondary | ICD-10-CM | POA: Diagnosis present

## 2017-03-10 DIAGNOSIS — R112 Nausea with vomiting, unspecified: Secondary | ICD-10-CM | POA: Diagnosis not present

## 2017-03-10 DIAGNOSIS — Z79899 Other long term (current) drug therapy: Secondary | ICD-10-CM

## 2017-03-10 DIAGNOSIS — I10 Essential (primary) hypertension: Secondary | ICD-10-CM | POA: Diagnosis not present

## 2017-03-10 HISTORY — DX: Incisional hernia without obstruction or gangrene: K43.2

## 2017-03-10 LAB — BASIC METABOLIC PANEL
ANION GAP: 10 (ref 5–15)
BUN: 10 mg/dL (ref 6–20)
CHLORIDE: 100 mmol/L — AB (ref 101–111)
CO2: 28 mmol/L (ref 22–32)
Calcium: 9.2 mg/dL (ref 8.9–10.3)
Creatinine, Ser: 0.85 mg/dL (ref 0.61–1.24)
GFR calc Af Amer: >60 mL/min (ref >60)
GFR calc non Af Amer: >60 mL/min (ref >60)
GLUCOSE: 107 mg/dL — AB (ref 65–99)
POTASSIUM: 3.8 mmol/L (ref 3.5–5.1)
Sodium: 138 mmol/L (ref 135–145)

## 2017-03-10 LAB — CBC
HEMATOCRIT: 38 % — AB (ref 39.0–52.0)
HEMOGLOBIN: 12.9 g/dL — AB (ref 13.0–17.0)
MCH: 29.2 pg (ref 26.0–34.0)
MCHC: 33.9 g/dL (ref 30.0–36.0)
MCV: 86 fL (ref 78.0–100.0)
Platelets: 269 10*3/uL (ref 150–400)
RBC: 4.42 MIL/uL (ref 4.22–5.81)
RDW: 12.6 % (ref 11.5–15.5)
WBC: 7 10*3/uL (ref 4.0–10.5)

## 2017-03-10 MED ORDER — CHLORHEXIDINE GLUCONATE CLOTH 2 % EX PADS: 6.0000 | MEDICATED_PAD | Freq: Once | CUTANEOUS | Status: DC

## 2017-03-10 NOTE — Progress Notes (Signed)
PCP: Scarlette Calico No cardiologist EKG: 02/23/17 CXR: 02/23/17  Pt states he recently was fatigued and went to MD, had CXR and EKG performed. Pt states everything was normal per MD but he was dehydrated. and he has been drinking gatorade to stay hydrated. Pt has not felt this way since then.   No complaints of chest pain, SOB or signs of infection at PAT appointment.

## 2017-03-12 ENCOUNTER — Inpatient Hospital Stay (HOSPITAL_COMMUNITY)
Admission: AD | Admit: 2017-03-12 | Discharge: 2017-03-13 | DRG: 355 | Disposition: A | Discharge status: Home / Self Care | Payer: MEDICARE | Source: Ambulatory Visit | Attending: Surgery | Admitting: Surgery

## 2017-03-12 ENCOUNTER — Encounter (HOSPITAL_COMMUNITY): Payer: Self-pay | Admitting: Certified Registered Nurse Anesthetist

## 2017-03-12 ENCOUNTER — Ambulatory Visit (HOSPITAL_COMMUNITY): Payer: MEDICARE | Admitting: Anesthesiology

## 2017-03-12 ENCOUNTER — Encounter (HOSPITAL_COMMUNITY)
Admission: AD | Disposition: A | Discharge status: Home / Self Care | Payer: Self-pay | Source: Ambulatory Visit | Attending: Surgery

## 2017-03-12 DIAGNOSIS — K439 Ventral hernia without obstruction or gangrene: Secondary | ICD-10-CM | POA: Diagnosis not present

## 2017-03-12 DIAGNOSIS — K432 Incisional hernia without obstruction or gangrene: Secondary | ICD-10-CM | POA: Diagnosis present

## 2017-03-12 DIAGNOSIS — I10 Essential (primary) hypertension: Secondary | ICD-10-CM | POA: Diagnosis not present

## 2017-03-12 DIAGNOSIS — Z79899 Other long term (current) drug therapy: Secondary | ICD-10-CM | POA: Diagnosis not present

## 2017-03-12 DIAGNOSIS — R112 Nausea with vomiting, unspecified: Secondary | ICD-10-CM | POA: Diagnosis not present

## 2017-03-12 DIAGNOSIS — G8918 Other acute postprocedural pain: Secondary | ICD-10-CM | POA: Diagnosis not present

## 2017-03-12 HISTORY — DX: Unspecified osteoarthritis, unspecified site: M19.90

## 2017-03-12 HISTORY — PX: INCISIONAL HERNIA REPAIR: SHX193

## 2017-03-12 HISTORY — PX: INSERTION OF MESH: SHX5868

## 2017-03-12 HISTORY — PX: LAPAROSCOPIC INCISIONAL / UMBILICAL / VENTRAL HERNIA REPAIR: SUR789

## 2017-03-12 SURGERY — REPAIR, HERNIA, INCISIONAL, LAPAROSCOPIC
Anesthesia: General | Site: Abdomen

## 2017-03-12 MED ORDER — ACETAMINOPHEN 500 MG PO TABS
ORAL_TABLET | ORAL | Status: AC
  Filled 2017-03-12: qty 2

## 2017-03-12 MED ORDER — ZOLPIDEM TARTRATE 5 MG PO TABS: 5.0000 mg | ORAL_TABLET | Freq: Every evening | ORAL | Status: DC | PRN

## 2017-03-12 MED ORDER — POLYETHYLENE GLYCOL 3350 17 G PO PACK: 17.0000 g | PACK | Freq: Every day | ORAL | Status: DC | PRN

## 2017-03-12 MED ORDER — HYDROCHLOROTHIAZIDE 25 MG PO TABS
25.0000 mg | ORAL_TABLET | Freq: Every day | ORAL | Status: DC
  Administered 2017-03-12 – 2017-03-13 (×2): 25 mg via ORAL
  Filled 2017-03-12 (×2): qty 1

## 2017-03-12 MED ORDER — CELECOXIB 200 MG PO CAPS
ORAL_CAPSULE | ORAL | Status: AC
  Filled 2017-03-12: qty 2

## 2017-03-12 MED ORDER — TAMSULOSIN HCL 0.4 MG PO CAPS
0.4000 mg | ORAL_CAPSULE | Freq: Every day | ORAL | Status: DC
Start: 2017-03-12 — End: 2017-03-13
  Administered 2017-03-12 – 2017-03-13 (×2): 0.4 mg via ORAL
  Filled 2017-03-12 (×2): qty 1

## 2017-03-12 MED ORDER — AMLODIPINE BESYLATE 10 MG PO TABS
10.0000 mg | ORAL_TABLET | Freq: Every day | ORAL | Status: DC
  Administered 2017-03-13: 10 mg via ORAL
  Filled 2017-03-12: qty 1

## 2017-03-12 MED ORDER — ACETAMINOPHEN 325 MG PO TABS: 650.0000 mg | ORAL_TABLET | Freq: Four times a day (QID) | ORAL | Status: DC | PRN

## 2017-03-12 MED ORDER — SIMETHICONE 80 MG PO CHEW: 40.0000 mg | CHEWABLE_TABLET | Freq: Four times a day (QID) | ORAL | Status: DC | PRN

## 2017-03-12 MED ORDER — GABAPENTIN 300 MG PO CAPS
ORAL_CAPSULE | ORAL | Status: AC
  Filled 2017-03-12: qty 1

## 2017-03-12 MED ORDER — 0.9 % SODIUM CHLORIDE (POUR BTL) OPTIME
TOPICAL | Status: DC | PRN
  Administered 2017-03-12: 1000 mL

## 2017-03-12 MED ORDER — KCL IN DEXTROSE-NACL 20-5-0.9 MEQ/L-%-% IV SOLN
INTRAVENOUS | Status: DC
  Administered 2017-03-12 – 2017-03-13 (×2): via INTRAVENOUS
  Filled 2017-03-12 (×3): qty 1000

## 2017-03-12 MED ORDER — BUPIVACAINE HCL (PF) 0.25 % IJ SOLN
INTRAMUSCULAR | Status: DC | PRN
  Administered 2017-03-12: 14 mL

## 2017-03-12 MED ORDER — DIPHENHYDRAMINE HCL 50 MG/ML IJ SOLN: 12.5000 mg | Freq: Four times a day (QID) | INTRAMUSCULAR | Status: DC | PRN

## 2017-03-12 MED ORDER — OXYCODONE HCL 5 MG PO TABS
ORAL_TABLET | ORAL | Status: AC
  Filled 2017-03-12: qty 2

## 2017-03-12 MED ORDER — HYDRALAZINE HCL 20 MG/ML IJ SOLN: 10.0000 mg | INTRAMUSCULAR | Status: DC | PRN

## 2017-03-12 MED ORDER — LACTATED RINGERS IV SOLN
INTRAVENOUS | Status: DC
  Administered 2017-03-12: 12:00:00 via INTRAVENOUS
  Administered 2017-03-12: 50 mL/h via INTRAVENOUS

## 2017-03-12 MED ORDER — MIDAZOLAM HCL 2 MG/2ML IJ SOLN
INTRAMUSCULAR | Status: AC
  Filled 2017-03-12: qty 2

## 2017-03-12 MED ORDER — LIDOCAINE HCL 2 % EX GEL
CUTANEOUS | Status: DC | PRN
  Administered 2017-03-12: 1 via URETHRAL

## 2017-03-12 MED ORDER — HYDROMORPHONE HCL 1 MG/ML IJ SOLN
0.2500 mg | INTRAMUSCULAR | Status: DC | PRN
  Administered 2017-03-12: 0.5 mg via INTRAVENOUS

## 2017-03-12 MED ORDER — MEPERIDINE HCL 25 MG/ML IJ SOLN: 6.2500 mg | INTRAMUSCULAR | Status: DC | PRN

## 2017-03-12 MED ORDER — ROCURONIUM BROMIDE 10 MG/ML (PF) SYRINGE
PREFILLED_SYRINGE | INTRAVENOUS | Status: DC | PRN
  Administered 2017-03-12: 20 mg via INTRAVENOUS
  Administered 2017-03-12: 50 mg via INTRAVENOUS

## 2017-03-12 MED ORDER — ONDANSETRON 4 MG PO TBDP: 4.0000 mg | ORAL_TABLET | Freq: Four times a day (QID) | ORAL | Status: DC | PRN

## 2017-03-12 MED ORDER — PROPOFOL 10 MG/ML IV BOLUS
INTRAVENOUS | Status: DC | PRN
  Administered 2017-03-12: 175 mg via INTRAVENOUS

## 2017-03-12 MED ORDER — METHOCARBAMOL 500 MG PO TABS
ORAL_TABLET | ORAL | Status: AC
  Filled 2017-03-12: qty 1

## 2017-03-12 MED ORDER — ENOXAPARIN SODIUM 40 MG/0.4ML ~~LOC~~ SOLN: 40.0000 mg | SUBCUTANEOUS | Status: DC

## 2017-03-12 MED ORDER — ONDANSETRON HCL 4 MG/2ML IJ SOLN
4.0000 mg | Freq: Four times a day (QID) | INTRAMUSCULAR | Status: DC | PRN
  Administered 2017-03-12: 4 mg via INTRAVENOUS
  Filled 2017-03-12: qty 2

## 2017-03-12 MED ORDER — BUPIVACAINE HCL (PF) 0.25 % IJ SOLN
INTRAMUSCULAR | Status: AC
  Filled 2017-03-12: qty 30

## 2017-03-12 MED ORDER — CEFAZOLIN SODIUM 10 G IJ SOLR
3.0000 g | INTRAMUSCULAR | Status: AC
  Administered 2017-03-12: 2 g via INTRAVENOUS
  Filled 2017-03-12: qty 3000

## 2017-03-12 MED ORDER — ONDANSETRON HCL 4 MG/2ML IJ SOLN: 4.0000 mg | Freq: Once | INTRAMUSCULAR | Status: DC | PRN

## 2017-03-12 MED ORDER — BENAZEPRIL HCL 20 MG PO TABS
20.0000 mg | ORAL_TABLET | Freq: Every day | ORAL | Status: DC
  Administered 2017-03-12 – 2017-03-13 (×2): 20 mg via ORAL
  Filled 2017-03-12 (×2): qty 1

## 2017-03-12 MED ORDER — CELECOXIB 200 MG PO CAPS
400.0000 mg | ORAL_CAPSULE | ORAL | Status: AC
  Administered 2017-03-12: 400 mg via ORAL

## 2017-03-12 MED ORDER — HYDROMORPHONE HCL 1 MG/ML IJ SOLN
INTRAMUSCULAR | Status: AC
  Filled 2017-03-12: qty 0.5

## 2017-03-12 MED ORDER — ACETAMINOPHEN 500 MG PO TABS
1000.0000 mg | ORAL_TABLET | ORAL | Status: AC
  Administered 2017-03-12: 1000 mg via ORAL

## 2017-03-12 MED ORDER — HYDROMORPHONE HCL 2 MG/ML IJ SOLN
1.0000 mg | INTRAMUSCULAR | Status: DC | PRN
  Administered 2017-03-12: 1 mg via INTRAVENOUS
  Filled 2017-03-12: qty 1

## 2017-03-12 MED ORDER — FENTANYL CITRATE (PF) 100 MCG/2ML IJ SOLN
INTRAMUSCULAR | Status: DC | PRN
  Administered 2017-03-12 (×2): 50 ug via INTRAVENOUS

## 2017-03-12 MED ORDER — GABAPENTIN 300 MG PO CAPS
300.0000 mg | ORAL_CAPSULE | ORAL | Status: AC
  Administered 2017-03-12: 300 mg via ORAL

## 2017-03-12 MED ORDER — ACETAMINOPHEN 650 MG RE SUPP: 650.0000 mg | Freq: Four times a day (QID) | RECTAL | Status: DC | PRN

## 2017-03-12 MED ORDER — ONDANSETRON HCL 4 MG/2ML IJ SOLN
INTRAMUSCULAR | Status: AC
  Filled 2017-03-12: qty 2

## 2017-03-12 MED ORDER — LIDOCAINE 2% (20 MG/ML) 5 ML SYRINGE
INTRAMUSCULAR | Status: DC | PRN
  Administered 2017-03-12: 100 mg via INTRAVENOUS

## 2017-03-12 MED ORDER — ONDANSETRON HCL 4 MG/2ML IJ SOLN
INTRAMUSCULAR | Status: DC | PRN
  Administered 2017-03-12: 4 mg via INTRAVENOUS

## 2017-03-12 MED ORDER — SUGAMMADEX SODIUM 200 MG/2ML IV SOLN
INTRAVENOUS | Status: DC | PRN
  Administered 2017-03-12: 185 mg via INTRAVENOUS

## 2017-03-12 MED ORDER — FENTANYL CITRATE (PF) 100 MCG/2ML IJ SOLN
INTRAMUSCULAR | Status: AC
  Filled 2017-03-12: qty 2

## 2017-03-12 MED ORDER — PHENYLEPHRINE 40 MCG/ML (10ML) SYRINGE FOR IV PUSH (FOR BLOOD PRESSURE SUPPORT)
PREFILLED_SYRINGE | INTRAVENOUS | Status: AC
  Filled 2017-03-12: qty 10

## 2017-03-12 MED ORDER — KETOROLAC TROMETHAMINE 15 MG/ML IJ SOLN
15.0000 mg | Freq: Four times a day (QID) | INTRAMUSCULAR | Status: DC | PRN
  Administered 2017-03-13: 15 mg via INTRAVENOUS
  Filled 2017-03-12: qty 1

## 2017-03-12 MED ORDER — OXYCODONE HCL 5 MG PO TABS
5.0000 mg | ORAL_TABLET | ORAL | Status: DC | PRN
  Administered 2017-03-12 – 2017-03-13 (×5): 10 mg via ORAL
  Filled 2017-03-12 (×4): qty 2

## 2017-03-12 MED ORDER — LIDOCAINE 2% (20 MG/ML) 5 ML SYRINGE
INTRAMUSCULAR | Status: AC
  Filled 2017-03-12: qty 5

## 2017-03-12 MED ORDER — PROPOFOL 10 MG/ML IV BOLUS
INTRAVENOUS | Status: AC
  Filled 2017-03-12: qty 20

## 2017-03-12 MED ORDER — METHOCARBAMOL 500 MG PO TABS
500.0000 mg | ORAL_TABLET | Freq: Four times a day (QID) | ORAL | Status: DC | PRN
  Administered 2017-03-12: 500 mg via ORAL

## 2017-03-12 MED ORDER — DIPHENHYDRAMINE HCL 12.5 MG/5ML PO ELIX: 12.5000 mg | ORAL_SOLUTION | Freq: Four times a day (QID) | ORAL | Status: DC | PRN

## 2017-03-12 MED ORDER — PHENYLEPHRINE 40 MCG/ML (10ML) SYRINGE FOR IV PUSH (FOR BLOOD PRESSURE SUPPORT)
PREFILLED_SYRINGE | INTRAVENOUS | Status: DC | PRN
Start: 2017-03-12 — End: 2017-03-12
  Administered 2017-03-12 (×2): 120 ug via INTRAVENOUS

## 2017-03-12 MED ORDER — MIDAZOLAM HCL 5 MG/5ML IJ SOLN
INTRAMUSCULAR | Status: DC | PRN
Start: 2017-03-12 — End: 2017-03-12
  Administered 2017-03-12: 2 mg via INTRAVENOUS

## 2017-03-12 MED ORDER — BUPIVACAINE-EPINEPHRINE (PF) 0.25% -1:200000 IJ SOLN
INTRAMUSCULAR | Status: DC | PRN
  Administered 2017-03-12: 30 mL

## 2017-03-12 SURGICAL SUPPLY — 55 items
BINDER ABDOMINAL 12 ML 46-62 (SOFTGOODS) IMPLANT
BLADE CLIPPER SURG (BLADE) ×2 IMPLANT
CANISTER SUCT 3000ML PPV (MISCELLANEOUS) ×2 IMPLANT
CHLORAPREP W/TINT 26ML (MISCELLANEOUS) ×2 IMPLANT
COVER SURGICAL LIGHT HANDLE (MISCELLANEOUS) ×2 IMPLANT
DERMABOND ADVANCED (GAUZE/BANDAGES/DRESSINGS) ×1
DERMABOND ADVANCED .7 DNX12 (GAUZE/BANDAGES/DRESSINGS) ×1 IMPLANT
DEVICE RELIATACK FIXATION (MISCELLANEOUS) ×4 IMPLANT
DEVICE TROCAR PUNCTURE CLOSURE (ENDOMECHANICALS) ×2 IMPLANT
DRAPE WARM FLUID 44X44 (DRAPE) ×2 IMPLANT
ELECT CAUTERY BLADE 6.4 (BLADE) ×2 IMPLANT
ELECT REM PT RETURN 9FT ADLT (ELECTROSURGICAL) ×2
ELECTRODE REM PT RTRN 9FT ADLT (ELECTROSURGICAL) ×1 IMPLANT
GLOVE BIO SURGEON STRL SZ 6.5 (GLOVE) ×2 IMPLANT
GLOVE BIO SURGEON STRL SZ7.5 (GLOVE) ×2 IMPLANT
GLOVE BIO SURGEON STRL SZ8 (GLOVE) ×4 IMPLANT
GLOVE BIOGEL PI IND STRL 7.0 (GLOVE) ×1 IMPLANT
GLOVE BIOGEL PI IND STRL 7.5 (GLOVE) ×1 IMPLANT
GLOVE BIOGEL PI IND STRL 8 (GLOVE) ×1 IMPLANT
GLOVE BIOGEL PI IND STRL 8.5 (GLOVE) ×2 IMPLANT
GLOVE BIOGEL PI INDICATOR 7.0 (GLOVE) ×1
GLOVE BIOGEL PI INDICATOR 7.5 (GLOVE) ×1
GLOVE BIOGEL PI INDICATOR 8 (GLOVE) ×1
GLOVE BIOGEL PI INDICATOR 8.5 (GLOVE) ×2
GLOVE ECLIPSE 7.0 STRL STRAW (GLOVE) ×2 IMPLANT
GLOVE SURG SS PI 6.5 STRL IVOR (GLOVE) ×2 IMPLANT
GOWN STRL REUS W/ TWL LRG LVL3 (GOWN DISPOSABLE) ×2 IMPLANT
GOWN STRL REUS W/ TWL XL LVL3 (GOWN DISPOSABLE) ×3 IMPLANT
GOWN STRL REUS W/TWL LRG LVL3 (GOWN DISPOSABLE) ×2
GOWN STRL REUS W/TWL XL LVL3 (GOWN DISPOSABLE) ×3
KIT BASIN OR (CUSTOM PROCEDURE TRAY) ×2 IMPLANT
KIT ROOM TURNOVER OR (KITS) ×2 IMPLANT
MARKER SKIN DUAL TIP RULER LAB (MISCELLANEOUS) ×2 IMPLANT
MESH VENTRALIGHT ST 6X8 (Mesh Specialty) ×1 IMPLANT
MESH VENTRLGHT ELLIPSE 8X6XMFL (Mesh Specialty) ×1 IMPLANT
NEEDLE SPNL 22GX3.5 QUINCKE BK (NEEDLE) ×2 IMPLANT
NS IRRIG 1000ML POUR BTL (IV SOLUTION) ×2 IMPLANT
PAD ARMBOARD 7.5X6 YLW CONV (MISCELLANEOUS) ×4 IMPLANT
PENCIL BUTTON HOLSTER BLD 10FT (ELECTRODE) ×2 IMPLANT
RELOAD RELIATACK 10 (MISCELLANEOUS) ×4 IMPLANT
RELOAD RELIATACK 5 (MISCELLANEOUS) IMPLANT
SET IRRIG TUBING LAPAROSCOPIC (IRRIGATION / IRRIGATOR) IMPLANT
SHEARS HARMONIC ACE PLUS 36CM (ENDOMECHANICALS) ×2 IMPLANT
SLEEVE ENDOPATH XCEL 5M (ENDOMECHANICALS) ×6 IMPLANT
SUT MON AB 4-0 PC3 18 (SUTURE) ×4 IMPLANT
SUT NOVA 1 T20/GS 25DT (SUTURE) ×4 IMPLANT
SUT VIC AB 0 CT1 27 (SUTURE)
SUT VIC AB 0 CT1 27XBRD ANBCTR (SUTURE) IMPLANT
TOWEL OR 17X24 6PK STRL BLUE (TOWEL DISPOSABLE) ×2 IMPLANT
TRAY FOLEY CATH 16FR SILVER (SET/KITS/TRAYS/PACK) IMPLANT
TRAY LAPAROSCOPIC MC (CUSTOM PROCEDURE TRAY) ×2 IMPLANT
TROCAR XCEL NON-BLD 11X100MML (ENDOMECHANICALS) ×2 IMPLANT
TROCAR XCEL NON-BLD 5MMX100MML (ENDOMECHANICALS) ×2 IMPLANT
TUBE CONNECTING 12X1/4 (SUCTIONS) ×2 IMPLANT
TUBING INSUFFLATION (TUBING) ×2 IMPLANT

## 2017-03-12 NOTE — Interval H&P Note (Signed)
History and Physical Interval Note:  03/12/2017 9:16 AM  Corey [name]  has presented today for surgery, with the diagnosis of Incisional hernia  The various methods of treatment have been discussed with the patient and family. After consideration of risks, benefits and other options for treatment, the patient has consented to  Procedure(s): [place] (N/A) as a surgical intervention .  The patient's history has been reviewed, patient examined, no change in status, stable for surgery.  I have reviewed the patient's chart and labs.  Questions were answered to the patient's satisfaction.     Atha Muradyan A.

## 2017-03-12 NOTE — Anesthesia Procedure Notes (Addendum)
Anesthesia Regional Block: TAP block   Pre-Anesthetic Checklist: ,, timeout performed,, Correct Site,, Correct Procedure,, site marked,,, surgical consent,, at surgeon's request  Laterality: Left and Right  Prep: chloraprep       Needles:  Injection technique: Single-shot  Needle Type: Echogenic Stimulator Needle     Needle Length: 5cm  Needle Gauge: 22     Additional Needles:   Procedures: ultrasound guided,,,,,,,,  Narrative:  Start time: 03/12/2017 10:10 AM End time: 03/12/2017 10:15 AM  Performed by: Personally  Anesthesiologist: Shray Hunley         Bilateral TAP block

## 2017-03-12 NOTE — Op Note (Signed)
Preoperative diagnosis: Incisional hernia without strangulation, obstruction reducible  Postoperative diagnosis: Same  Procedure: Laparoscopic repair of incisional hernia with mesh  Surgeon: Erroll Luna M.D.  Anesthesia: Gen. with regional block and local anesthetic  EBL: 10 mL  Specimen: None  Drains: None  Indications for procedure: Patient presents for repair of his incisional hernia the stomach becomes symptomatic. I discussed open laparoscopic approaches as well as complications of each. I discussed operative management. He is offered for laparoscopic repair of his incisional hernia with mesh.The risk of hernia repair include bleeding,  Infection,   Recurrence of the hernia,  Mesh use, chronic pain,  Organ injury,  Bowel injury,  Bladder injury,   nerve injury with numbness around the incision,  Death,  and worsening of preexisting  medical problems.  The alternatives to surgery have been discussed as well..  Long term expectations of both operative and non operative treatments have been discussed.   The patient agrees to proceed.  Description of procedure: Patient was met in the holding area and questions are answered. He was taken to the operating room at this time and placed supine on the OR table. After induction of general anesthesia, both arms were tucked. Of note, anesthesia performed bilateral TAP blocks.  An attempt to place a Foley catheter was unsuccessful. The nurse tried met some resistance. We switched to a smaller 14 catheter from a 16 catheter and I placed Myself. There is resistance mentis prostate gland and therefore elected to not place a Foley at this time. We used lidocaine jelly to assist.  There was no blood at the meatus and minimal pressure was used.  I did not want to cause injury and elected to forgo the foley catheter at this time since the hernia was  above the umbilicus. The abdomen was prepped and draped in a sterile fashion. Timeout was done he received 2 g  of Ancef. A 5 mm left upper quadrant port was placed using the Optiview catheter and the camera to guide this into the adult cavity without difficulty or injury. Pneumoperitoneum was created to 15 mmHg of pressure. Laparoscopy was performed. There is a Swiss cheese defect involving his incision which started just at the umbilicus and extended to the xiphoid. There are 2 hernias one measuring 3 x 4 cm and the other 1 x 1 cm below that. From the umbilicus down to the pubis there is no evidence of hernia. Evidence of previous inguinal hernia surgery noted without recurrence. There are no small bowel adhesions to the hernia. I placed 3 other 5 mm ports in the left lower quadrant right upper quadrant and right lower quadrant. Harmonic scalpel was used to take the falciform ligament down from the superior aspect of the hernia. I used a 15 x 20 cm complicated BardPort polypropylene mesh. This was brought on the field and 4 tacking sutures were placed on the rough side. This was and hydrated. A small midline incision was used and I used a hernia defect to place the mesh into the abdominal cavity. I then closed the fascia of the defect with #1 Novafil. I reinsufflated bowel cavity. The 4 sutures were grasped with a suture passer and brought out symmetrically. These were tied down. The mesh was secured to the peritoneum circumferentially with 2 rows of absorbable tacks.  There is no pleating of the mesh laid quite nicely covering the defect with at least 4-5 cm of overlap. The adult cavity was reinspected. There is no signs of bleeding.  There is no evidence of injury to the internal viscera with 4 quadrant laparoscopy. At this point time the port were allowed to remove the CO2 was allowed to escape. All skin incisions were closed with 4-0 Monocryl and Dermabond. Abdominal binder placed. All final counts found to be correct. Patient was awoke extubated taken to recovery in satisfactory condition.

## 2017-03-12 NOTE — Anesthesia Procedure Notes (Signed)
Procedure Name: Intubation Date/Time: 03/12/2017 9:59 AM Performed by: Willeen Cass P Pre-anesthesia Checklist: Patient identified, Emergency Drugs available, Suction available and Patient being monitored Patient Re-evaluated:Patient Re-evaluated prior to inductionOxygen Delivery Method: Circle System Utilized Preoxygenation: Pre-oxygenation with 100% oxygen Intubation Type: IV induction Ventilation: Mask ventilation without difficulty and Oral airway inserted - appropriate to patient size Laryngoscope Size: Mac and 4 Grade View: Grade I Tube type: Oral Number of attempts: 1 Airway Equipment and Method: Stylet and Oral airway Placement Confirmation: ETT inserted through vocal cords under direct vision,  positive ETCO2 and breath sounds checked- equal and bilateral Secured at: 23 cm Tube secured with: Tape Dental Injury: Teeth and Oropharynx as per pre-operative assessment

## 2017-03-12 NOTE — Progress Notes (Signed)
mulpitle incicion sites

## 2017-03-12 NOTE — OR Nursing (Signed)
Patient's black glasses with black cloth attachment was placed in a bag with a patient label and was attached to the patient chart upon arrival to the OR suite.

## 2017-03-12 NOTE — Transfer of Care (Signed)
Immediate Anesthesia Transfer of Care Note  Patient: Corey [name]  Procedure(s) Performed: Procedure(s): [place] (N/A) INSERTION OF MESH (N/A)  Patient Location: PACU  Anesthesia Type:General  Level of Consciousness: awake, alert , patient cooperative and lethargic  Airway & Oxygen Therapy: Patient Spontanous Breathing and Patient connected to nasal cannula oxygen  Post-op Assessment: Report given to RN, Post -op Vital signs reviewed and stable and Patient moving all extremities  Post vital signs: Reviewed and stable  Last Vitals:  Vitals:   03/12/17 0817  BP: 120/77  Pulse: 73  Resp: 18  Temp: 36.9 C    Last Pain:  Vitals:   03/12/17 0817  TempSrc: Oral      Patients Stated Pain Goal: 6 (19/59/74 7185)  Complications: No apparent anesthesia complications

## 2017-03-12 NOTE — Anesthesia Preprocedure Evaluation (Addendum)
Anesthesia Evaluation    Airway Mallampati: II  TM Distance: >3 FB Neck ROM: Full    Dental  (+) Teeth Intact   Pulmonary    breath sounds clear to auscultation       Cardiovascular hypertension,  Rhythm:Regular Rate:Normal     Neuro/Psych    GI/Hepatic   Endo/Other    Renal/GU      Musculoskeletal   Abdominal   Peds  Hematology   Anesthesia Other Findings   Reproductive/Obstetrics                            Anesthesia Physical Anesthesia Plan  ASA: II  Anesthesia Plan: General   Post-op Pain Management: GA combined w/ Regional for post-op pain   Induction:   Airway Management Planned: Oral ETT  Additional Equipment:   Intra-op Plan:   Post-operative Plan: Extubation in OR  Informed Consent: I have reviewed the patients History and Physical, chart, labs and discussed the procedure including the risks, benefits and alternatives for the proposed anesthesia with the patient or authorized representative who has indicated his/her understanding and acceptance.   Dental advisory given  Plan Discussed with: CRNA  Anesthesia Plan Comments: (TAP block in OR.)       Anesthesia Quick Evaluation

## 2017-03-12 NOTE — H&P (View-Only) (Signed)
Corey [name] 03/02/2017 10:12 AM Location: [facility] Patient #: [id number] DOB: [birth year] Married / Language: Corey [name] / Race: White Male  History of Present Illness Corey Moores A. Corey Sarsfield MD; 03/02/2017 11:11 AM) Patient words: Patient returns for follow-up of his incisional hernia. He was seen in October did not schedule her work constraints. He is about the same but was to go ahead and get his surgery set up to repair his incisional hernia. He also states some discomfort in his right inguinal region. Denies any left groin pain. Otherwise, he's had no significant change from his previous visit.  The patient is a 66 year old male.   Allergies Corey [name] [place], [place]; 03/02/2017 10:13 AM) No Known Drug Allergies 10/17/2016  Medication History Corey [name], [place]; 03/02/2017 10:13 AM) Multi-Minerals (Oral daily) Active. Aleve (220MG Tablet, Oral as needed) Active. AmLODIPine Besylate (10MG Tablet, Oral) Active. Benazepril HCl (20MG Tablet, Oral) Active. HydroCHLOROthiazide (25MG Tablet, Oral) Active. Medications Reconciled    Vitals Corey [name] Corey [name] CMA; 03/02/2017 10:13 AM) 03/02/2017 10:13 AM Weight: 199.4 lb Height: 70in Body Surface Area: 2.08 m Body Mass Index: 28.61 kg/m  Temp.: 98.40F  Pulse: 79 (Regular)  BP: 122/78 (Sitting, Left Arm, Standard)      Physical Exam (Corey [name] A. Amayrani Bennick MD; 03/02/2017 11:11 AM)  General Mental Status-Alert. General Appearance-Consistent with stated age. Hydration-Well hydrated. Voice-Normal.  Abdomen Note: Reducible small epigastric incisional hernia. Soft nontender. No evidence of right or left inguinal hernia on exam today.  Neurologic Neurologic evaluation reveals -alert and oriented x 3 with no impairment of recent or remote memory. Mental Status-Normal.  Musculoskeletal Normal Exam - Left-Upper Extremity Strength Normal and Lower Extremity Strength Normal. Normal Exam - Right-Upper Extremity  Strength Normal and Lower Extremity Strength Normal.    Assessment & Plan (Corey [name] A. Corey Barletta MD; 03/02/2017 11:11 AM)  INCISIONAL HERNIA, WITHOUT OBSTRUCTION OR GANGRENE (K43.2) Impression: Discussed open and laparoscopic approaches with the use of mesh. Recommended a laparoscopic approach to his recurrent incisional hernia with mesh repair. Risks, benefits and alternative therapies discussed. Recommend using a binder for time being. He is unsure when he can get this repaired. I discussed with the symptoms of incarceration strangulation and recommended if these do occur he seek emergent care. He will let me know if and when he wishes repair. The risk of hernia repair include bleeding, infection, organ injury, bowel injury, bladder injury, nerve injury recurrent hernia, blood clots, worsening of underlying condition, chronic pain, mesh use, open surgery, death, and the need for other operattions. Pt agrees to proceed  Current Plans The anatomy & physiology of the abdominal wall was discussed. The pathophysiology of hernias was discussed. Natural history risks without surgery including progeressive enlargement, pain, incarceration, & strangulation was discussed. Contributors to complications such as smoking, obesity, diabetes, prior surgery, etc were discussed.  I feel the risks of no intervention will lead to serious problems that outweigh the operative risks; therefore, I recommended surgery to reduce and repair the hernia. I explained laparoscopic techniques with possible need for an open approach. I noted the probable use of mesh to patch and/or buttress the hernia repair  Risks such as bleeding, infection, abscess, need for further treatment, heart attack, death, and other risks were discussed. I noted a good likelihood this will help address the problem. Goals of post-operative recovery were discussed as well. Possibility that this will not correct all symptoms was explained. I stressed  the importance of low-impact activity, aggressive pain control, avoiding constipation, & not pushing through  pain to minimize risk of post-operative chronic pain or injury. Possibility of reherniation especially with smoking, obesity, diabetes, immunosuppression, and other health conditions was discussed. We will work to minimize complications.  An educational handout further explaining the pathology & treatment options was given as well. Questions were answered. The patient expresses understanding & wishes to proceed with surgery.  You are being scheduled for surgery- Our schedulers will call you.  You should hear from our office's scheduling department within 5 working days about the location, date, and time of surgery. We try to make accommodations for patient's preferences in scheduling surgery, but sometimes the OR schedule or the surgeon's schedule prevents [place] from making those accommodations.  If you have not heard from our office [phone]) in 5 working days, call the office and ask for your surgeon's nurse.  If you have other questions about your diagnosis, plan, or surgery, call the office and ask for your surgeon's nurse.  Pt Education - Pamphlet Given - Hernia Surgery: discussed with patient and provided information.

## 2017-03-12 NOTE — Anesthesia Postprocedure Evaluation (Signed)
Anesthesia Post Note  Patient: Corey [name]  Procedure(s) Performed: Procedure(s) (LRB): LAPAROSCOPIC INCISIONAL HERNIA REPAIR (N/A) INSERTION OF MESH (N/A)  Patient location during evaluation: PACU Anesthesia Type: General Level of consciousness: awake Pain management: pain level controlled Vital Signs Assessment: post-procedure vital signs reviewed and stable Respiratory status: spontaneous breathing Cardiovascular status: stable Postop Assessment: no signs of nausea or vomiting Anesthetic complications: no       Last Vitals:  Vitals:   03/12/17 1300 03/12/17 1327  BP: 123/84 116/74  Pulse: 66 64  Resp: (!) 22 20  Temp: 36.6 C 36.4 C    Last Pain:  Vitals:   03/12/17 1345  TempSrc:   PainSc: 7                  [employer]

## 2017-03-13 ENCOUNTER — Encounter (HOSPITAL_COMMUNITY): Payer: Self-pay | Admitting: Surgery

## 2017-03-13 DIAGNOSIS — K432 Incisional hernia without obstruction or gangrene: Secondary | ICD-10-CM | POA: Diagnosis not present

## 2017-03-13 LAB — COMPREHENSIVE METABOLIC PANEL
ALBUMIN: 2.8 g/dL — AB (ref 3.5–5.0)
ALT: 19 U/L (ref 17–63)
AST: 48 U/L — AB (ref 15–41)
Alkaline Phosphatase: 72 U/L (ref 38–126)
Anion gap: 10 (ref 5–15)
BILIRUBIN TOTAL: 0.4 mg/dL (ref 0.3–1.2)
BUN: 32 mg/dL — AB (ref 6–20)
CHLORIDE: 102 mmol/L (ref 101–111)
CO2: 27 mmol/L (ref 22–32)
CREATININE: 1.15 mg/dL (ref 0.61–1.24)
Calcium: 8.3 mg/dL — ABNORMAL LOW (ref 8.9–10.3)
GFR calc Af Amer: >60 mL/min (ref >60)
GLUCOSE: 173 mg/dL — AB (ref 65–99)
POTASSIUM: 4 mmol/L (ref 3.5–5.1)
Sodium: 139 mmol/L (ref 135–145)
Total Protein: 6 g/dL — ABNORMAL LOW (ref 6.5–8.1)

## 2017-03-13 LAB — CBC
HEMATOCRIT: 36.1 % — AB (ref 39.0–52.0)
Hemoglobin: 12.1 g/dL — ABNORMAL LOW (ref 13.0–17.0)
MCH: 29.2 pg (ref 26.0–34.0)
MCHC: 33.5 g/dL (ref 30.0–36.0)
MCV: 87.2 fL (ref 78.0–100.0)
PLATELETS: 265 10*3/uL (ref 150–400)
RBC: 4.14 MIL/uL — AB (ref 4.22–5.81)
RDW: 13.3 % (ref 11.5–15.5)
WBC: 11.8 10*3/uL — ABNORMAL HIGH (ref 4.0–10.5)

## 2017-03-13 MED ORDER — METHOCARBAMOL 500 MG PO TABS: 500.0000 mg | ORAL_TABLET | Freq: Four times a day (QID) | ORAL | 0 refills | Status: DC | PRN

## 2017-03-13 MED ORDER — POLYETHYLENE GLYCOL 3350 17 G PO PACK: 17.0000 g | PACK | Freq: Every day | ORAL | 0 refills | Status: DC | PRN

## 2017-03-13 MED ORDER — OXYCODONE HCL 5 MG PO TABS: 5.0000 mg | ORAL_TABLET | ORAL | 0 refills | Status: DC | PRN

## 2017-03-13 MED ORDER — ONDANSETRON 4 MG PO TBDP: 4.0000 mg | ORAL_TABLET | Freq: Four times a day (QID) | ORAL | 0 refills | Status: DC | PRN

## 2017-03-13 NOTE — Progress Notes (Signed)
1 Day Post-Op  Subjective: Not [place] some emesis overnight and nausea  Objective: Vital signs in last 24 hours: Temp:  [97.3 F (36.3 C)-99.1 F (37.3 C)] 99.1 F (37.3 C) (03/16 0201) Pulse Rate:  [64-85] 85 (03/16 0543) Resp:  [14-24] 20 (03/16 0543) BP: (109-139)/(69-92) 126/79 (03/16 0543) SpO2:  [91 %-100 %] 91 % (03/16 0543) Last BM Date: 03/12/17  Intake/Output from previous day: 03/15 0701 - 03/16 0700 In: 3005 [P.O.:720; I.V.:2285] Out: 625 [Urine:600; Blood:25] Intake/Output this shift: No intake/output data recorded.  Incision/Wound:CDI soft abdomen  Sore   Lab Results:   Recent Labs  03/10/17 1112 03/13/17 0625  WBC 7.0 11.8*  HGB 12.9* 12.1*  HCT 38.0* 36.1*  PLT 269 265   BMET  Recent Labs  03/10/17 1112 03/13/17 0625  NA 138 139  K 3.8 4.0  CL 100* 102  CO2 28 27  GLUCOSE 107* 173*  BUN 10 32*  CREATININE 0.85 1.15  CALCIUM 9.2 8.3*   PT/INR No results for input(s): LABPROT, INR in the last 72 hours. ABG No results for input(s): PHART, HCO3 in the last 72 hours.  Invalid input(s): PCO2, PO2  Studies/Results: No results found.  Anti-infectives: Anti-infectives    Start     Dose/Rate Route Frequency Ordered Stop   03/12/17 0721  ceFAZolin (ANCEF) 3 g in dextrose 5 % 50 mL IVPB     3 g 130 mL/hr over 30 Minutes Intravenous On call to O.R. 03/12/17 0721 03/12/17 1012      Assessment/Plan: s/p Procedure(s): LAPAROSCOPIC INCISIONAL HERNIA REPAIR (N/A) INSERTION OF MESH (N/A) [place] home later today as long as her is not nauseated  Ambulate  Can stay on clears if desired  Pt desires discharge but I told him if he has persistent nausea / vomiting he may want to stay   LOS: 1 day    Elick Aguilera A. 03/13/2017

## 2017-03-13 NOTE — Discharge Summary (Addendum)
Physician Discharge Summary  Patient ID: Corey [name] MRN: [id number] DOB/AGE: 1951/06/23 66 y.o.  Admit date: 03/12/2017 Discharge date: 03/13/2017  Admission Diagnoses:incisional hernia  Discharge Diagnoses:  Active Problems:   Incisional hernia   Discharged Condition: good  Hospital Course: Pt did well.  He had some post op  N/V  But this improved.  He was tolerating po intake and had stable vital.  He was able to ambulate to halls.  The [place] narcotic database has been quieried and no conflicts identified.  Consults: None  Significant Diagnostic Studies: labs:  CBC    Component Value Date/Time   WBC 11.8 (H) 03/13/2017 0625   RBC 4.14 (L) 03/13/2017 0625   HGB 12.1 (L) 03/13/2017 0625   HCT 36.1 (L) 03/13/2017 0625   PLT 265 03/13/2017 0625   MCV 87.2 03/13/2017 0625   MCH 29.2 03/13/2017 0625   MCHC 33.5 03/13/2017 0625   RDW 13.3 03/13/2017 0625   LYMPHSABS 0.5 (L) 02/23/2017 1221   MONOABS 1.0 02/23/2017 1221   EOSABS 0.2 02/23/2017 1221   BASOSABS 0.0 02/23/2017 1221    Treatments: surgery: laproscopic incisional hernia repair   Discharge Exam: Blood pressure 126/79, pulse 85, temperature 99.1 F (37.3 C), temperature source Oral, resp. rate 20, weight 91.5 kg (201 lb 12.8 oz), SpO2 91 %. General appearance: alert and cooperative Resp: clear to auscultation bilaterally Cardio: regular rate and rhythm, S1, S2 normal, no murmur, click, rub or gallop Incision/Wound:CDI soft   ND sore   Disposition: Final discharge disposition not confirmed  Discharge Instructions    Call MD for:    Complete by:  As directed    Call MD for:  persistant nausea and vomiting    Complete by:  As directed    Call MD for:  redness, tenderness, or signs of infection (pain, swelling, redness, odor or green/yellow discharge around incision site)    Complete by:  As directed    Call MD for:  severe uncontrolled pain    Complete by:  As directed    Call MD for:  temperature >100.4     Complete by:  As directed    Diet - low sodium heart healthy    Complete by:  As directed    Increase activity slowly    Complete by:  As directed      Allergies as of 03/13/2017   No Known Allergies     Medication List    TAKE these medications   amLODipine 10 MG tablet Commonly known as:  NORVASC Take 1 tablet (10 mg total) by mouth daily.   benazepril 20 MG tablet Commonly known as:  LOTENSIN Take 1 tablet (20 mg total) by mouth daily.   hydrochlorothiazide 25 MG tablet Commonly known as:  HYDRODIURIL Take 1 tablet (25 mg total) by mouth daily.   methocarbamol 500 MG tablet Commonly known as:  ROBAXIN Take 1 tablet (500 mg total) by mouth every 6 (six) hours as needed for muscle spasms.   multivitamin with minerals Tabs tablet Take 1 tablet by mouth daily.   naproxen sodium 220 MG tablet Commonly known as:  ANAPROX Take 440 mg by mouth 2 (two) times daily as needed (for pain.).   ondansetron 4 MG disintegrating tablet Commonly known as:  ZOFRAN-ODT Take 1 tablet (4 mg total) by mouth every 6 (six) hours as needed for nausea.   oxyCODONE 5 MG immediate release tablet Commonly known as:  Oxy IR/ROXICODONE Take 1-2 tablets (5-10 mg total) by mouth  every 4 (four) hours as needed for moderate pain.   polyethylene glycol packet Commonly known as:  MIRALAX / GLYCOLAX Take 17 g by mouth daily as needed for mild constipation.        Signed: Porter Nakama A. 03/13/2017, 8:29 AM

## 2017-03-13 NOTE — Discharge Instructions (Signed)
[facility] _______[facility]  UMBILICAL OR INGUINAL HERNIA REPAIR: POST OP INSTRUCTIONS  Always review your discharge instruction sheet given to you by the facility where your surgery was performed. IF YOU HAVE DISABILITY OR FAMILY LEAVE FORMS, YOU MUST BRING THEM TO THE OFFICE FOR PROCESSING.   DO NOT GIVE THEM TO YOUR DOCTOR.  1. A  prescription for pain medication may be given to you upon discharge.  Take your pain medication as prescribed, if needed.  If narcotic pain medicine is not needed, then you may take acetaminophen (Tylenol) or ibuprofen (Advil) as needed. 2. Take your usually prescribed medications unless otherwise directed. If you need a refill on your pain medication, please contact your pharmacy.  They will contact our office to request authorization. Prescriptions will not be filled after 5 pm or on week-ends. 3. You should follow a light diet the first 24 hours after arrival home, such as soup and crackers, etc.  Be sure to include lots of fluids daily.  Resume your normal diet the day after surgery. 4.Most patients will experience some swelling and bruising around the umbilicus or in the groin and scrotum.  Ice packs and reclining will help.  Swelling and bruising can take several days to resolve.  6. It is common to experience some constipation if taking pain medication after surgery.  Increasing fluid intake and taking a stool softener (such as Colace) will usually help or prevent this problem from occurring.  A mild laxative (Milk of Magnesia or Miralax) should be taken according to package directions if there are no bowel movements after 48 hours. 7. Unless discharge instructions indicate otherwise, you may remove your bandages 24-48 hours after surgery, and you may shower at that time.  You may have steri-strips (small skin tapes) in place directly over the incision.  These strips should be left on the skin for 7-10 days.  If your surgeon used skin glue on the  incision, you may shower in 24 hours.  The glue will flake off over the next 2-3 weeks.  Any sutures or staples will be removed at the office during your follow-up visit. 8. ACTIVITIES:  You may resume regular (light) daily activities beginning the next day--such as daily self-care, walking, climbing stairs--gradually increasing activities as tolerated.  You may have sexual intercourse when it is comfortable.  Refrain from any heavy lifting or straining until approved by your doctor.  a.You may drive when you are no longer taking prescription pain medication, you can comfortably wear a seatbelt, and you can safely maneuver your car and apply brakes. b.RETURN TO WORK:   _____________________________________________  9.You should see your doctor in the office for a follow-up appointment approximately 2-3 weeks after your surgery.  Make sure that you call for this appointment within a day or two after you arrive home to insure a convenient appointment time. 10.OTHER INSTRUCTIONS: _________________________    _____________________________________  WHEN TO CALL YOUR DOCTOR: 1. Fever over 101.0 2. Inability to urinate 3. Nausea and/or vomiting 4. Extreme swelling or bruising 5. Continued bleeding from incision. 6. Increased pain, redness, or drainage from the incision  The clinic staff is available to answer your questions during regular business hours.  Please dont hesitate to call and ask to speak to one of the nurses for clinical concerns.  If you have a medical emergency, go to the nearest emergency room or call 911.  A surgeon from [facility] is always on call at the hospital  [address] [place], [place], [place]  [address] ?  [address], [place], [place]   [address] [phone] ? [phone] ? FAX [phone] Web site: [email or web address] LAPAROSCOPIC SURGERY: POST OP  INSTRUCTIONS  ######################################################################  EAT Gradually transition to a high fiber diet with a fiber supplement over the next few weeks after discharge.  Start with a pureed / full liquid diet (see below)  WALK Walk an hour a day.  Control your pain to do that.    CONTROL PAIN Control pain so that you can walk, sleep, tolerate sneezing/coughing, go up/down stairs.  HAVE A BOWEL MOVEMENT DAILY Keep your bowels regular to avoid problems.  OK to try a laxative to override constipation.  OK to use an antidairrheal to slow down diarrhea.  Call if not better after 2 tries  CALL IF YOU HAVE PROBLEMS/CONCERNS Call if you are still struggling despite following these instructions. Call if you have concerns not answered by these instructions  ######################################################################    1. DIET: Follow a light bland diet the first 24 hours after arrival home, such as soup, liquids, crackers, etc.  Be sure to include lots of fluids daily.  Avoid fast food or heavy meals as your are more likely to get nauseated.  Eat a low fat the next few days after surgery.   2. Take your usually prescribed home medications unless otherwise directed. 3. PAIN CONTROL: a. Pain is best controlled by a usual combination of three different methods TOGETHER: i. Ice/Heat ii. Over the counter pain medication iii. Prescription pain medication b. Most patients will experience some swelling and bruising around the incisions.  Ice packs or heating pads (30-60 minutes up to 6 times a day) will help. Use ice for the first few days to help decrease swelling and bruising, then switch to heat to help relax tight/sore spots and speed recovery.  Some people prefer to use ice alone, heat alone, alternating between ice & heat.  Experiment to what works for you.  Swelling and bruising can take several weeks to resolve.   c. It is helpful to take an  over-the-counter pain medication regularly for the first few weeks.  Choose one of the following that works best for you: i. Naproxen (Aleve, etc)  Two 261m tabs twice a day ii. Ibuprofen (Advil, etc) Three 2050mtabs four times a day (every meal & bedtime) iii. Acetaminophen (Tylenol, etc) 500-65060mour times a day (every meal & bedtime) d. A  prescription for pain medication (such as oxycodone, hydrocodone, etc) should be given to you upon discharge.  Take your pain medication as prescribed.  i. If you are having problems/concerns with the prescription medicine (does not control pain, nausea, vomiting, rash, itching, etc), please call us [phone] see if we need to switch you to a different pain medicine that will work better for you and/or control your side effect better. ii. If you need a refill on your pain medication, please contact your pharmacy.  They will contact our office to request authorization. Prescriptions will not be filled after 5 pm or on week-ends. 4. Avoid getting constipated.  Between the surgery and the pain medications, it is common to experience some constipation.  Increasing fluid intake and taking a fiber supplement (such as Metamucil, Citrucel, FiberCon, MiraLax, etc) 1-2 times a day regularly will usually help prevent this problem from occurring.  A mild laxative (prune juice, Milk of Magnesia, MiraLax, etc) should be taken according to package directions if  there are no bowel movements after 48 hours.   5. Watch out for diarrhea.  If you have many loose bowel movements, simplify your diet to bland foods & liquids for a few days.  Stop any stool softeners and decrease your fiber supplement.  Switching to mild anti-diarrheal medications (Kayopectate, Pepto Bismol) can help.  If this worsens or does not improve, please call us. 6. Wash / shower every day.  You may shower over the dressings as they are waterproof.  Continue to shower over incision(s) after the dressing is  off. 7. Remove your waterproof bandages 5 days after surgery.  You may leave the incision open to air.  You may replace a dressing/Band-Aid to cover the incision for comfort if you wish.  8. ACTIVITIES as tolerated:   a. You may resume regular (light) daily activities beginning the next day--such as daily self-care, walking, climbing stairs--gradually increasing activities as tolerated.  If you can walk 30 minutes without difficulty, it is safe to try more intense activity such as jogging, treadmill, bicycling, low-impact aerobics, swimming, etc. b. Save the most intensive and strenuous activity for last such as sit-ups, heavy lifting, contact sports, etc  Refrain from any heavy lifting or straining until you are off narcotics for pain control.   c. DO NOT PUSH THROUGH PAIN.  Let pain be your guide: If it hurts to do something, don't do it.  Pain is your body warning you to avoid that activity for another week until the pain goes down. d. You may drive when you are no longer taking prescription pain medication, you can comfortably wear a seatbelt, and you can safely maneuver your car and apply brakes. e. Dennis Bast may have sexual intercourse when it is comfortable.  9. FOLLOW UP in our office a. Please call [facility] at [phone] to set up an appointment to see your surgeon in the office for a follow-up appointment approximately 2-3 weeks after your surgery. b. Make sure that you call for this appointment the day you arrive home to insure a convenient appointment time. 10. IF YOU HAVE DISABILITY OR FAMILY LEAVE FORMS, BRING THEM TO THE OFFICE FOR PROCESSING.  DO NOT GIVE THEM TO YOUR DOCTOR.   WHEN TO CALL us [phone]: 7. Poor pain control 8. Reactions / problems with new medications (rash/itching, nausea, etc)  9. Fever over 101.5 F (38.5 C) 10. Inability to urinate 11. Nausea and/or vomiting 12. Worsening swelling or bruising 13. Continued bleeding from incision. 14. Increased pain, redness,  or drainage from the incision   The clinic staff is available to answer your questions during regular business hours (8:30am-5pm).  Please dont hesitate to call and ask to speak to one of our nurses for clinical concerns.   If you have a medical emergency, go to the nearest emergency room or call 911.  A surgeon from [facility] is always on call at the [facility], [place], [place], [place], [place]  [address] ? MAIN: [phone] ? TOLL FREE: [phone] ?  FAX [phone] [email or web address]

## 2017-05-27 DIAGNOSIS — H524 Presbyopia: Secondary | ICD-10-CM | POA: Diagnosis not present

## 2017-05-27 DIAGNOSIS — H35712 Central serous chorioretinopathy, left eye: Secondary | ICD-10-CM | POA: Diagnosis not present

## 2017-05-27 DIAGNOSIS — H35363 Drusen (degenerative) of macula, bilateral: Secondary | ICD-10-CM | POA: Diagnosis not present

## 2017-05-27 DIAGNOSIS — H2513 Age-related nuclear cataract, bilateral: Secondary | ICD-10-CM | POA: Diagnosis not present

## 2017-05-27 DIAGNOSIS — H52223 Regular astigmatism, bilateral: Secondary | ICD-10-CM | POA: Diagnosis not present

## 2017-05-27 DIAGNOSIS — H5203 Hypermetropia, bilateral: Secondary | ICD-10-CM | POA: Diagnosis not present

## 2017-06-04 DIAGNOSIS — H353221 Exudative age-related macular degeneration, left eye, with active choroidal neovascularization: Secondary | ICD-10-CM | POA: Diagnosis not present

## 2017-06-04 DIAGNOSIS — H353113 Nonexudative age-related macular degeneration, right eye, advanced atrophic without subfoveal involvement: Secondary | ICD-10-CM | POA: Diagnosis not present

## 2017-06-11 DIAGNOSIS — H353113 Nonexudative age-related macular degeneration, right eye, advanced atrophic without subfoveal involvement: Secondary | ICD-10-CM | POA: Diagnosis not present

## 2017-06-11 DIAGNOSIS — H353221 Exudative age-related macular degeneration, left eye, with active choroidal neovascularization: Secondary | ICD-10-CM | POA: Diagnosis not present

## 2017-06-29 ENCOUNTER — Other Ambulatory Visit (INDEPENDENT_AMBULATORY_CARE_PROVIDER_SITE_OTHER): Payer: Medicare HMO

## 2017-06-29 ENCOUNTER — Ambulatory Visit (INDEPENDENT_AMBULATORY_CARE_PROVIDER_SITE_OTHER): Payer: Medicare HMO | Admitting: Internal Medicine

## 2017-06-29 ENCOUNTER — Encounter: Payer: Self-pay | Admitting: Internal Medicine

## 2017-06-29 VITALS — BP 120/80 | HR 84 | Temp 98.4°F | Resp 16 | Ht 70.0 in | Wt 202.0 lb

## 2017-06-29 DIAGNOSIS — I10 Essential (primary) hypertension: Secondary | ICD-10-CM

## 2017-06-29 DIAGNOSIS — Z8546 Personal history of malignant neoplasm of prostate: Secondary | ICD-10-CM

## 2017-06-29 DIAGNOSIS — C61 Malignant neoplasm of prostate: Secondary | ICD-10-CM

## 2017-06-29 DIAGNOSIS — E785 Hyperlipidemia, unspecified: Secondary | ICD-10-CM

## 2017-06-29 DIAGNOSIS — Z23 Encounter for immunization: Secondary | ICD-10-CM | POA: Diagnosis not present

## 2017-06-29 DIAGNOSIS — Z0001 Encounter for general adult medical examination with abnormal findings: Secondary | ICD-10-CM

## 2017-06-29 DIAGNOSIS — R739 Hyperglycemia, unspecified: Secondary | ICD-10-CM

## 2017-06-29 DIAGNOSIS — D539 Nutritional anemia, unspecified: Secondary | ICD-10-CM

## 2017-06-29 DIAGNOSIS — R8281 Pyuria: Secondary | ICD-10-CM

## 2017-06-29 DIAGNOSIS — N390 Urinary tract infection, site not specified: Secondary | ICD-10-CM | POA: Diagnosis not present

## 2017-06-29 DIAGNOSIS — Z0000 Encounter for general adult medical examination without abnormal findings: Secondary | ICD-10-CM

## 2017-06-29 LAB — URINALYSIS, ROUTINE W REFLEX MICROSCOPIC
Bilirubin Urine: NEGATIVE
Hgb urine dipstick: NEGATIVE
KETONES UR: NEGATIVE
Leukocytes, UA: NEGATIVE
NITRITE: NEGATIVE
Specific Gravity, Urine: 1.02 (ref 1.000–1.030)
Total Protein, Urine: NEGATIVE
URINE GLUCOSE: NEGATIVE
Urobilinogen, UA: 1 (ref 0.0–1.0)
pH: 7 (ref 5.0–8.0)

## 2017-06-29 LAB — CBC WITH DIFFERENTIAL/PLATELET
Basophils Absolute: 0 10*3/uL (ref 0.0–0.1)
Basophils Relative: 0.2 % (ref 0.0–3.0)
EOS PCT: 2.9 % (ref 0.0–5.0)
Eosinophils Absolute: 0.2 10*3/uL (ref 0.0–0.7)
HCT: 41.2 % (ref 39.0–52.0)
Hemoglobin: 13.8 g/dL (ref 13.0–17.0)
LYMPHS ABS: 0.7 10*3/uL (ref 0.7–4.0)
Lymphocytes Relative: 10.9 % — ABNORMAL LOW (ref 12.0–46.0)
MCHC: 33.5 g/dL (ref 30.0–36.0)
MCV: 84.5 fl (ref 78.0–100.0)
MONO ABS: 0.6 10*3/uL (ref 0.1–1.0)
MONOS PCT: 9.6 % (ref 3.0–12.0)
NEUTROS PCT: 76.4 % (ref 43.0–77.0)
Neutro Abs: 4.8 10*3/uL (ref 1.4–7.7)
Platelets: 158 10*3/uL (ref 150.0–400.0)
RBC: 4.87 Mil/uL (ref 4.22–5.81)
RDW: 14.5 % (ref 11.5–15.5)
WBC: 6.2 10*3/uL (ref 4.0–10.5)

## 2017-06-29 LAB — POCT GLYCOSYLATED HEMOGLOBIN (HGB A1C): Hemoglobin A1C: 5.6

## 2017-06-29 LAB — POCT GLUCOSE (DEVICE FOR HOME USE): GLUCOSE FASTING, POC: 106 mg/dL — AB (ref 70–99)

## 2017-06-29 NOTE — Progress Notes (Signed)
Subjective:  Patient ID: Corey [name], male    DOB: 11-19-1951  Age: 66 y.o. MRN: [id number]  CC: Annual Exam and Anemia   HPI Corey [name] presents for an AWV.  He is status post hernia repair a few months ago and is doing well. During that surgery he was found to have mild hyperglycemia and mild anemia. He feels well today and offers no complaints.  Past Medical History:  Diagnosis Date  . Arthritis    "minor; elbows, wrists" (03/12/2017)  . BPH (benign prostatic hyperplasia) 2011  . Hypertension   . Incisional hernia   . Prostate cancer ([place]) 2016   S/P prostatectomy, TURP, radiation in 2017   Past Surgical History:  Procedure Laterality Date  . HERNIA REPAIR    . INCISIONAL HERNIA REPAIR N/A 03/12/2017   Procedure: LAPAROSCOPIC INCISIONAL HERNIA REPAIR;  Surgeon: Erroll Luna, MD;  Location: [facility];  Service: General;  Laterality: N/A;  . INGUINAL HERNIA REPAIR Right ~ 2007  . INSERTION OF MESH N/A 03/12/2017   Procedure: INSERTION OF MESH;  Surgeon: Erroll Luna, MD;  Location: [facility];  Service: General;  Laterality: N/A;  . LAPAROSCOPIC INCISIONAL / UMBILICAL / [place]  03/12/2017   IHR w/mesh  . PROSTATECTOMY  2017  . TRANSURETHRAL RESECTION OF PROSTATE  2017   "after they removed my prostate"  . UMBILICAL HERNIA REPAIR  ~ 2012    reports that he has never smoked. He has never used smokeless tobacco. He reports that he drinks about 4.2 oz of alcohol per week . He reports that he does not use drugs. family history includes Alcohol abuse in his sister; Cancer in his mother; Early death in his sister. No Known Allergies  Outpatient Medications Prior to Visit  Medication Sig Dispense Refill  . amLODipine (NORVASC) 10 MG tablet Take 1 tablet (10 mg total) by mouth daily. 90 tablet 1  . benazepril (LOTENSIN) 20 MG tablet Take 1 tablet (20 mg total) by mouth daily. 90 tablet 1  . hydrochlorothiazide (HYDRODIURIL) 25 MG tablet Take 1 tablet (25 mg total) by  mouth daily. 90 tablet 1  . Multiple Vitamin (MULTIVITAMIN WITH MINERALS) TABS tablet Take 1 tablet by mouth daily.    . methocarbamol (ROBAXIN) 500 MG tablet Take 1 tablet (500 mg total) by mouth every 6 (six) hours as needed for muscle spasms. 20 tablet 0  . naproxen sodium (ANAPROX) 220 MG tablet Take 440 mg by mouth 2 (two) times daily as needed (for pain.).    Marland Kitchen ondansetron (ZOFRAN-ODT) 4 MG disintegrating tablet Take 1 tablet (4 mg total) by mouth every 6 (six) hours as needed for nausea. 20 tablet 0  . oxyCODONE (OXY IR/ROXICODONE) 5 MG immediate release tablet Take 1-2 tablets (5-10 mg total) by mouth every 4 (four) hours as needed for moderate pain. 30 tablet 0  . polyethylene glycol (MIRALAX / GLYCOLAX) packet Take 17 g by mouth daily as needed for mild constipation. 14 each 0   No facility-administered medications prior to visit.     ROS Review of Systems  Constitutional: Negative.  Negative for appetite change, diaphoresis and unexpected weight change.  HENT: Negative.  Negative for trouble swallowing.   Eyes: Negative for visual disturbance.  Respiratory: Negative for cough, chest tightness, shortness of breath and wheezing.   Cardiovascular: Negative for chest pain, palpitations and leg swelling.  Gastrointestinal: Negative for abdominal pain, constipation, diarrhea, nausea and vomiting.  Endocrine: Negative.  Negative for polydipsia and polyphagia.  Genitourinary: Negative.  Negative for difficulty urinating, discharge, dysuria, genital sores, hematuria, penile swelling, scrotal swelling, testicular pain and urgency.  Musculoskeletal: Negative.  Negative for back pain, myalgias and neck pain.  Skin: Negative.  Negative for color change and rash.  Allergic/Immunologic: Negative.   Neurological: Negative.  Negative for dizziness, weakness and headaches.  Hematological: Negative for adenopathy. Does not bruise/bleed easily.  Psychiatric/Behavioral: Negative.     Objective:    BP 120/80 (BP Location: Left Arm, Patient Position: Sitting, Cuff Size: Normal)   Pulse 84   Temp 98.4 F (36.9 C) (Oral)   Resp 16   Ht 5' 10"  (1.778 m)   Wt 202 lb (91.6 kg)   SpO2 100%   BMI 28.98 kg/m   BP Readings from Last 3 Encounters:  06/29/17 120/80  03/13/17 (!) 144/87  03/10/17 110/76    Wt Readings from Last 3 Encounters:  06/29/17 202 lb (91.6 kg)  03/12/17 201 lb 12.8 oz (91.5 kg)  03/10/17 201 lb 12.8 oz (91.5 kg)    Physical Exam  Constitutional: He is oriented to person, place, and time. No distress.  HENT:  Mouth/Throat: Oropharynx is clear and moist. No oropharyngeal exudate.  Eyes: Conjunctivae are normal. Right eye exhibits no discharge. Left eye exhibits no discharge. No scleral icterus.  Neck: Normal range of motion. Neck supple. No JVD present. No thyromegaly present.  Cardiovascular: Normal rate, regular rhythm and intact distal pulses.  Exam reveals no gallop and no friction rub.   No murmur heard. Pulmonary/Chest: Effort normal and breath sounds normal. No respiratory distress. He has no wheezes. He has no rales. He exhibits no tenderness.  Abdominal: Soft. Bowel sounds are normal. He exhibits no distension and no mass. There is no tenderness. There is no rebound and no guarding.  Musculoskeletal: Normal range of motion. He exhibits no edema, tenderness or deformity.  Lymphadenopathy:    He has no cervical adenopathy.  Neurological: He is alert and oriented to person, place, and time.  Skin: Skin is warm and dry. No rash noted. He is not diaphoretic. No erythema. No pallor.  Psychiatric: He has a normal mood and affect. His behavior is normal. Judgment and thought content normal.  Vitals reviewed.   Lab Results  Component Value Date   WBC 6.2 06/29/2017   HGB 13.8 06/29/2017   HCT 41.2 06/29/2017   PLT 158.0 06/29/2017   GLUCOSE 173 (H) 03/13/2017   CHOL 171 06/29/2017   TRIG 63.0 06/29/2017   HDL 61.10 06/29/2017   LDLCALC 97  06/29/2017   ALT 19 03/13/2017   AST 48 (H) 03/13/2017   NA 139 03/13/2017   K 4.0 03/13/2017   CL 102 03/13/2017   CREATININE 1.15 03/13/2017   BUN 32 (H) 03/13/2017   CO2 27 03/13/2017   TSH 2.02 06/29/2017   PSA 0.00 (L) 06/29/2017   HGBA1C 5.6 06/29/2017    No results found.  Assessment & Plan:   Corey [name] was seen today for annual exam and anemia.  Diagnoses and all orders for this visit:  Pyuria- he has asymptomatic pyuria, I've asked him to return for repeat urinalysis, urine culture, and screening for gonorrhea and chlamydia. -     CULTURE, URINE COMPREHENSIVE; Future -     Urinalysis, Routine w reflex microscopic; Future -     GC/Chlamydia Probe Amp; Future  Prostate cancer [facility])- his PSA remains undetectable, there is no evidence of recurrence of prostate cancer. -     PSA; Future  Essential hypertension, benign -     Urinalysis, Routine w reflex microscopic; Future  Deficiency anemia- his H&H are normal now but he is mildly deficient in B12 and iron so I have asked him to start replacement therapy. -     IBC panel; Future -     CBC with Differential/Platelet; Future -     Vitamin B12; Future -     Ferritin; Future -     Folate; Future -     ferrous sulfate 325 (65 FE) MG tablet; Take 1 tablet (325 mg total) by mouth daily with breakfast. -     cyanocobalamin 2000 MCG tablet; Take 1 tablet (2,000 mcg total) by mouth daily.  Hyperlipidemia with target LDL less than 130- he has achieved his LDL goal and has a Framingham risk score of 10%, at this time he is not willing to take a statin for CV risk reduction. -     Lipid panel; Future -     TSH; Future  Hyperglycemia- his A1c is mildly elevated at 5.6% he will continue to work on his lifestyle modifications. -     POCT glycosylated hemoglobin (Hb A1C) -     POCT Glucose (Device for Home Use)  Need for vaccination for Strep pneumoniae -     Pneumococcal conjugate vaccine 13-valent  Routine general medical  examination at a health care facility   I have discontinued Corey [name]'s naproxen sodium, methocarbamol, ondansetron, oxyCODONE, and polyethylene glycol. I am also having him start on ferrous sulfate and cyanocobalamin. Additionally, I am having him maintain his benazepril, amLODipine, hydrochlorothiazide, and multivitamin with minerals.  Meds ordered this encounter  Medications  . ferrous sulfate 325 (65 FE) MG tablet    Sig: Take 1 tablet (325 mg total) by mouth daily with breakfast.    Dispense:  90 tablet    Refill:  1  . cyanocobalamin 2000 MCG tablet    Sig: Take 1 tablet (2,000 mcg total) by mouth daily.    Dispense:  90 tablet    Refill:  3   See AVS for instructions about healthy living and anticipatory guidance.  Follow-up: Return in about 6 months (around 12/30/2017).  Scarlette Calico, MD

## 2017-06-29 NOTE — Patient Instructions (Signed)

## 2017-06-30 ENCOUNTER — Encounter: Payer: Self-pay | Admitting: Internal Medicine

## 2017-06-30 LAB — LIPID PANEL
Cholesterol: 171 mg/dL (ref 0–200)
HDL: 61.1 mg/dL (ref >39.00)
LDL CALC: 97 mg/dL (ref 0–99)
NONHDL: 109.94
TRIGLYCERIDES: 63 mg/dL (ref 0.0–149.0)
Total CHOL/HDL Ratio: 3
VLDL: 12.6 mg/dL (ref 0.0–40.0)

## 2017-06-30 LAB — FOLATE: Folate: >24 ng/mL (ref >5.9)

## 2017-06-30 LAB — VITAMIN B12: VITAMIN B 12: 235 pg/mL (ref 211–911)

## 2017-06-30 LAB — IBC PANEL
Iron: 77 ug/dL (ref 42–165)
SATURATION RATIOS: 20.8 % (ref 20.0–50.0)
TRANSFERRIN: 264 mg/dL (ref 212.0–360.0)

## 2017-06-30 LAB — FERRITIN: Ferritin: 21.4 ng/mL — ABNORMAL LOW (ref 22.0–322.0)

## 2017-06-30 LAB — TSH: TSH: 2.02 u[IU]/mL (ref 0.35–4.50)

## 2017-06-30 LAB — PSA: PSA: 0 ng/mL — ABNORMAL LOW (ref 0.10–4.00)

## 2017-06-30 MED ORDER — FERROUS SULFATE 325 (65 FE) MG PO TABS: 325.0000 mg | ORAL_TABLET | Freq: Every day | ORAL | 1 refills | Status: DC

## 2017-06-30 MED ORDER — CYANOCOBALAMIN 2000 MCG PO TABS: 2000.0000 ug | ORAL_TABLET | Freq: Every day | ORAL | 3 refills | Status: DC

## 2017-07-02 ENCOUNTER — Encounter: Payer: Self-pay | Admitting: Internal Medicine

## 2017-07-02 DIAGNOSIS — R8281 Pyuria: Secondary | ICD-10-CM | POA: Insufficient documentation

## 2017-07-02 NOTE — Assessment & Plan Note (Signed)

## 2017-07-03 ENCOUNTER — Encounter: Payer: Self-pay | Admitting: Internal Medicine

## 2017-07-03 ENCOUNTER — Other Ambulatory Visit (INDEPENDENT_AMBULATORY_CARE_PROVIDER_SITE_OTHER): Payer: Medicare HMO

## 2017-07-03 DIAGNOSIS — N390 Urinary tract infection, site not specified: Secondary | ICD-10-CM

## 2017-07-03 DIAGNOSIS — R8281 Pyuria: Secondary | ICD-10-CM

## 2017-07-03 LAB — URINALYSIS, ROUTINE W REFLEX MICROSCOPIC
Bilirubin Urine: NEGATIVE
Hgb urine dipstick: NEGATIVE
Leukocytes, UA: NEGATIVE
NITRITE: NEGATIVE
RBC / HPF: NONE SEEN (ref 0–?)
Specific Gravity, Urine: >=1.03 — AB (ref 1.000–1.030)
TOTAL PROTEIN, URINE-UPE24: NEGATIVE
Urine Glucose: NEGATIVE
Urobilinogen, UA: 0.2 (ref 0.0–1.0)
pH: 5.5 (ref 5.0–8.0)

## 2017-07-05 LAB — CULTURE, URINE COMPREHENSIVE: Organism ID, Bacteria: NO GROWTH

## 2017-07-06 ENCOUNTER — Encounter: Payer: Self-pay | Admitting: Internal Medicine

## 2017-07-06 LAB — GC/CHLAMYDIA PROBE AMP
CT Probe RNA: NOT DETECTED
GC Probe RNA: NOT DETECTED

## 2017-07-09 DIAGNOSIS — H353221 Exudative age-related macular degeneration, left eye, with active choroidal neovascularization: Secondary | ICD-10-CM | POA: Diagnosis not present

## 2017-07-09 DIAGNOSIS — H353113 Nonexudative age-related macular degeneration, right eye, advanced atrophic without subfoveal involvement: Secondary | ICD-10-CM | POA: Diagnosis not present

## 2017-08-05 ENCOUNTER — Other Ambulatory Visit: Payer: Self-pay | Admitting: Internal Medicine

## 2017-08-05 DIAGNOSIS — I10 Essential (primary) hypertension: Secondary | ICD-10-CM

## 2017-08-06 DIAGNOSIS — H353221 Exudative age-related macular degeneration, left eye, with active choroidal neovascularization: Secondary | ICD-10-CM | POA: Diagnosis not present

## 2017-08-06 DIAGNOSIS — H353113 Nonexudative age-related macular degeneration, right eye, advanced atrophic without subfoveal involvement: Secondary | ICD-10-CM | POA: Diagnosis not present

## 2017-09-21 DIAGNOSIS — H353113 Nonexudative age-related macular degeneration, right eye, advanced atrophic without subfoveal involvement: Secondary | ICD-10-CM | POA: Diagnosis not present

## 2017-09-21 DIAGNOSIS — H353221 Exudative age-related macular degeneration, left eye, with active choroidal neovascularization: Secondary | ICD-10-CM | POA: Diagnosis not present

## 2017-11-02 DIAGNOSIS — H353221 Exudative age-related macular degeneration, left eye, with active choroidal neovascularization: Secondary | ICD-10-CM | POA: Diagnosis not present

## 2017-11-02 DIAGNOSIS — H353113 Nonexudative age-related macular degeneration, right eye, advanced atrophic without subfoveal involvement: Secondary | ICD-10-CM | POA: Diagnosis not present

## 2017-12-16 DIAGNOSIS — H353221 Exudative age-related macular degeneration, left eye, with active choroidal neovascularization: Secondary | ICD-10-CM | POA: Diagnosis not present

## 2017-12-30 ENCOUNTER — Ambulatory Visit: Payer: Medicare HMO | Admitting: Internal Medicine

## 2018-01-21 ENCOUNTER — Encounter: Payer: Self-pay | Admitting: Family

## 2018-01-21 ENCOUNTER — Other Ambulatory Visit: Payer: Medicare HMO

## 2018-01-21 ENCOUNTER — Ambulatory Visit (INDEPENDENT_AMBULATORY_CARE_PROVIDER_SITE_OTHER): Payer: Medicare HMO | Admitting: Family

## 2018-01-21 ENCOUNTER — Other Ambulatory Visit (INDEPENDENT_AMBULATORY_CARE_PROVIDER_SITE_OTHER): Payer: Medicare HMO

## 2018-01-21 VITALS — BP 126/80 | HR 74 | Temp 98.3°F | Wt 205.0 lb

## 2018-01-21 DIAGNOSIS — L03113 Cellulitis of right upper limb: Secondary | ICD-10-CM | POA: Diagnosis not present

## 2018-01-21 DIAGNOSIS — Z8546 Personal history of malignant neoplasm of prostate: Secondary | ICD-10-CM

## 2018-01-21 DIAGNOSIS — E538 Deficiency of other specified B group vitamins: Secondary | ICD-10-CM | POA: Diagnosis not present

## 2018-01-21 LAB — CBC WITH DIFFERENTIAL/PLATELET
BASOS PCT: 0.4 % (ref 0.0–3.0)
Basophils Absolute: 0 10*3/uL (ref 0.0–0.1)
EOS PCT: 2.7 % (ref 0.0–5.0)
Eosinophils Absolute: 0.2 10*3/uL (ref 0.0–0.7)
HCT: 42.6 % (ref 39.0–52.0)
Hemoglobin: 14.6 g/dL (ref 13.0–17.0)
LYMPHS ABS: 0.8 10*3/uL (ref 0.7–4.0)
Lymphocytes Relative: 9.6 % — ABNORMAL LOW (ref 12.0–46.0)
MCHC: 34.3 g/dL (ref 30.0–36.0)
MCV: 85.8 fl (ref 78.0–100.0)
MONOS PCT: 9.1 % (ref 3.0–12.0)
Monocytes Absolute: 0.8 10*3/uL (ref 0.1–1.0)
NEUTROS PCT: 78.2 % — AB (ref 43.0–77.0)
Neutro Abs: 6.6 10*3/uL (ref 1.4–7.7)
Platelets: 192 10*3/uL (ref 150.0–400.0)
RBC: 4.97 Mil/uL (ref 4.22–5.81)
RDW: 13.1 % (ref 11.5–15.5)
WBC: 8.5 10*3/uL (ref 4.0–10.5)

## 2018-01-21 LAB — PSA: PSA: 0 ng/mL — ABNORMAL LOW (ref 0.10–4.00)

## 2018-01-21 LAB — VITAMIN B12: VITAMIN B 12: 418 pg/mL (ref 211–911)

## 2018-01-21 MED ORDER — SULFAMETHOXAZOLE-TRIMETHOPRIM 800-160 MG PO TABS: 1.0000 | ORAL_TABLET | Freq: Two times a day (BID) | ORAL | 0 refills | Status: DC

## 2018-01-21 NOTE — Progress Notes (Signed)
Corey [name] is a 67 y.o. male with the following history as recorded in [employer]:  Patient Active Problem List   Diagnosis Date Noted  . Pyuria 07/02/2017  . Deficiency anemia 06/29/2017  . Hyperglycemia 06/29/2017  . Hyperlipidemia with target LDL less than 130 07/11/2016  . Essential hypertension, benign 08/05/2013  . Prostate cancer ([place]) 08/05/2013  . Routine general medical examination at a health care facility 08/05/2013    Current Outpatient Medications  Medication Sig Dispense Refill  . amLODipine (NORVASC) 10 MG tablet TAKE 1 TABLET BY MOUTH EVERY DAY 90 tablet 1  . benazepril (LOTENSIN) 20 MG tablet TAKE 1 TABLET BY MOUTH EVERY DAY 90 tablet 1  . ferrous sulfate 325 (65 FE) MG tablet Take 1 tablet (325 mg total) by mouth daily with breakfast. 90 tablet 1  . hydrochlorothiazide (HYDRODIURIL) 25 MG tablet TAKE 1 TABLET BY MOUTH EVERY DAY 90 tablet 1  . Multiple Vitamin (MULTIVITAMIN WITH MINERALS) TABS tablet Take 1 tablet by mouth daily.    . cyanocobalamin 2000 MCG tablet Take 1 tablet (2,000 mcg total) by mouth daily. (Patient not taking: Reported on 01/21/2018) 90 tablet 3  . sulfamethoxazole-trimethoprim (BACTRIM DS,SEPTRA DS) 800-160 MG tablet Take 1 tablet by mouth 2 (two) times daily. 20 tablet 0   No current facility-administered medications for this visit.     Allergies: Patient has no known allergies.  Past Medical History:  Diagnosis Date  . Arthritis    "minor; elbows, wrists" (03/12/2017)  . BPH (benign prostatic hyperplasia) 2011  . Hypertension   . Incisional hernia   . Prostate cancer ([place]) 2016   S/P prostatectomy, TURP, radiation in 2017    Past Surgical History:  Procedure Laterality Date  . HERNIA REPAIR    . INCISIONAL HERNIA REPAIR N/A 03/12/2017   Procedure: LAPAROSCOPIC INCISIONAL HERNIA REPAIR;  Surgeon: Erroll Luna, MD;  Location: [facility];  Service: General;  Laterality: N/A;  . INGUINAL HERNIA REPAIR Right ~ 2007  . INSERTION OF MESH N/A  03/12/2017   Procedure: INSERTION OF MESH;  Surgeon: Erroll Luna, MD;  Location: [facility];  Service: General;  Laterality: N/A;  . LAPAROSCOPIC INCISIONAL / UMBILICAL / [place]  03/12/2017   IHR w/mesh  . PROSTATECTOMY  2017  . TRANSURETHRAL RESECTION OF PROSTATE  2017   "after they removed my prostate"  . UMBILICAL HERNIA REPAIR  ~ 2012    Family History  Problem Relation Age of Onset  . Cancer Mother   . Alcohol abuse Sister   . Early death Sister   . Stroke Neg Hx   . Kidney disease Neg Hx   . Hypertension Neg Hx   . Heart disease Neg Hx   . Hyperlipidemia Neg Hx   . Diabetes Neg Hx   . Depression Neg Hx   . COPD Neg Hx   . Asthma Neg Hx     Social History   Tobacco Use  . Smoking status: Never Smoker  . Smokeless tobacco: Never Used  Substance Use Topics  . Alcohol use: Yes    Alcohol/week: 4.2 oz    Types: 7 Glasses of wine per week    Subjective:  Patient presents with concerns for "sores" on his hands/ shoulder which have started within the past week; start as open sore with pus and then scab over; denies any fever; works at a [employer] and wonders if chemicals affecting his skin;not using any OTC treatment; has started wearing "stronger" gloves at work and  thinks offering some benefit.   Would also like to get his B12 level and PSA re-checked today if possible; not currently taking any type of B12 supplement- was diagnosed with low B12 last summer.   Objective:  Vitals:   01/21/18 1536  BP: 126/80  Pulse: 74  Temp: 98.3 F (36.8 C)  SpO2: 98%  Weight: 205 lb (93 kg)    General: Well developed, well nourished, in no acute distress  Skin : Warm and dry. Open small wound on right hand and left hand (dorsal surface), right and left shoulder; erythema surrounding with pustular drainage;  Head: Normocephalic and atraumatic  Lungs: Respirations unlabored; clear to auscultation bilaterally without wheeze, rales, rhonchi  Musculoskeletal: No  deformities; no active joint inflammation  Extremities: No edema, cyanosis, clubbing  Vessels: Symmetric bilaterally  Neurologic: Alert and oriented; speech intact; face symmetrical; moves all extremities well; CNII-XII intact without focal deficit  Assessment:  1. Cellulitis of right upper extremity   2. History of prostate cancer   3. B12 deficiency     Plan:  1. ? MRSA infection; check culture; Rx for Bactrim DS bid x 10 days; follow-up to be determined. 2. Check PSA per patient request; 3. Check CBC, B12 per patient request.   No Follow-up on file.  Orders Placed This Encounter  Procedures  . Wound culture    Standing Status:   Future    Number of Occurrences:   1    Standing Expiration Date:   01/21/2019    Order Specific Question:   Source    Answer:   Right hand  . CBC w/Diff    Standing Status:   Future    Number of Occurrences:   1    Standing Expiration Date:   01/21/2019  . B12    Standing Status:   Future    Number of Occurrences:   1    Standing Expiration Date:   01/21/2019  . PSA    Standing Status:   Future    Number of Occurrences:   1    Standing Expiration Date:   01/21/2019    Requested Prescriptions   Signed Prescriptions Disp Refills  . sulfamethoxazole-trimethoprim (BACTRIM DS,SEPTRA DS) 800-160 MG tablet 20 tablet 0    Sig: Take 1 tablet by mouth 2 (two) times daily.

## 2018-01-24 LAB — WOUND CULTURE
MICRO NUMBER:: 90102487
RESULT: NO GROWTH
SPECIMEN QUALITY:: ADEQUATE

## 2018-01-25 ENCOUNTER — Ambulatory Visit: Payer: Medicare HMO | Admitting: Internal Medicine

## 2018-01-27 DIAGNOSIS — H353221 Exudative age-related macular degeneration, left eye, with active choroidal neovascularization: Secondary | ICD-10-CM | POA: Diagnosis not present

## 2018-01-27 DIAGNOSIS — H31111 Age-related choroidal atrophy, right eye: Secondary | ICD-10-CM | POA: Diagnosis not present

## 2018-01-27 DIAGNOSIS — H353113 Nonexudative age-related macular degeneration, right eye, advanced atrophic without subfoveal involvement: Secondary | ICD-10-CM | POA: Diagnosis not present

## 2018-02-02 ENCOUNTER — Other Ambulatory Visit: Payer: Self-pay | Admitting: Internal Medicine

## 2018-02-02 DIAGNOSIS — I10 Essential (primary) hypertension: Secondary | ICD-10-CM

## 2018-02-10 ENCOUNTER — Other Ambulatory Visit (INDEPENDENT_AMBULATORY_CARE_PROVIDER_SITE_OTHER): Payer: Medicare HMO

## 2018-02-10 ENCOUNTER — Encounter: Payer: Self-pay | Admitting: Internal Medicine

## 2018-02-10 ENCOUNTER — Ambulatory Visit (INDEPENDENT_AMBULATORY_CARE_PROVIDER_SITE_OTHER): Payer: Medicare HMO | Admitting: Internal Medicine

## 2018-02-10 VITALS — BP 132/88 | HR 71 | Temp 98.0°F | Resp 16 | Ht 70.0 in | Wt 202.0 lb

## 2018-02-10 DIAGNOSIS — I10 Essential (primary) hypertension: Secondary | ICD-10-CM

## 2018-02-10 DIAGNOSIS — Z23 Encounter for immunization: Secondary | ICD-10-CM

## 2018-02-10 LAB — BASIC METABOLIC PANEL
BUN: 20 mg/dL (ref 6–23)
CALCIUM: 9.2 mg/dL (ref 8.4–10.5)
CO2: 31 mEq/L (ref 19–32)
CREATININE: 1 mg/dL (ref 0.40–1.50)
Chloride: 101 mEq/L (ref 96–112)
GFR: 79.33 mL/min (ref >60.00)
GLUCOSE: 92 mg/dL (ref 70–99)
Potassium: 3.5 mEq/L (ref 3.5–5.1)
Sodium: 138 mEq/L (ref 135–145)

## 2018-02-10 LAB — CBC WITH DIFFERENTIAL/PLATELET
Basophils Absolute: 0 10*3/uL (ref 0.0–0.1)
Basophils Relative: 0.5 % (ref 0.0–3.0)
EOS ABS: 0.2 10*3/uL (ref 0.0–0.7)
Eosinophils Relative: 3.5 % (ref 0.0–5.0)
HCT: 42.5 % (ref 39.0–52.0)
HEMOGLOBIN: 14.6 g/dL (ref 13.0–17.0)
LYMPHS ABS: 0.9 10*3/uL (ref 0.7–4.0)
Lymphocytes Relative: 15.3 % (ref 12.0–46.0)
MCHC: 34.4 g/dL (ref 30.0–36.0)
MCV: 86.6 fl (ref 78.0–100.0)
MONO ABS: 0.6 10*3/uL (ref 0.1–1.0)
Monocytes Relative: 11.2 % (ref 3.0–12.0)
NEUTROS PCT: 69.5 % (ref 43.0–77.0)
Neutro Abs: 3.9 10*3/uL (ref 1.4–7.7)
Platelets: 180 10*3/uL (ref 150.0–400.0)
RBC: 4.91 Mil/uL (ref 4.22–5.81)
RDW: 13.8 % (ref 11.5–15.5)
WBC: 5.6 10*3/uL (ref 4.0–10.5)

## 2018-02-10 MED ORDER — AMLODIPINE BESYLATE 10 MG PO TABS: 10.0000 mg | ORAL_TABLET | Freq: Every day | ORAL | 1 refills | Status: DC

## 2018-02-10 MED ORDER — HYDROCHLOROTHIAZIDE 25 MG PO TABS: 25.0000 mg | ORAL_TABLET | Freq: Every day | ORAL | 1 refills | Status: DC

## 2018-02-10 MED ORDER — BENAZEPRIL HCL 20 MG PO TABS: 20.0000 mg | ORAL_TABLET | Freq: Every day | ORAL | 1 refills | Status: DC

## 2018-02-10 NOTE — Patient Instructions (Signed)

## 2018-02-10 NOTE — Progress Notes (Signed)
Subjective:  Patient ID: Corey [name], male    DOB: [birth year]  Age: 67 y.o. MRN: [id number]  CC: Hypertension   HPI Grahm Etsitty presents for f/up - He returns for a blood pressure check.  He tells me his blood pressure has been well controlled.  He is very active and denies any recent episodes of DOE, CP, palpitations, edema, or fatigue.  Outpatient Medications Prior to Visit  Medication Sig Dispense Refill  . cyanocobalamin 2000 MCG tablet Take 1 tablet (2,000 mcg total) by mouth daily. 90 tablet 3  . ferrous sulfate 325 (65 FE) MG tablet Take 1 tablet (325 mg total) by mouth daily with breakfast. 90 tablet 1  . Multiple Vitamin (MULTIVITAMIN WITH MINERALS) TABS tablet Take 1 tablet by mouth daily.    Marland Kitchen amLODipine (NORVASC) 10 MG tablet TAKE 1 TABLET BY MOUTH EVERY DAY 90 tablet 1  . benazepril (LOTENSIN) 20 MG tablet TAKE 1 TABLET BY MOUTH EVERY DAY 90 tablet 1  . hydrochlorothiazide (HYDRODIURIL) 25 MG tablet TAKE 1 TABLET BY MOUTH EVERY DAY 90 tablet 1  . sulfamethoxazole-trimethoprim (BACTRIM DS,SEPTRA DS) 800-160 MG tablet Take 1 tablet by mouth 2 (two) times daily. 20 tablet 0   No facility-administered medications prior to visit.     ROS Review of Systems  Constitutional: Negative.  Negative for appetite change, diaphoresis, fatigue and unexpected weight change.  HENT: Negative.   Eyes: Negative.   Respiratory: Negative for cough, chest tightness, shortness of breath and wheezing.   Cardiovascular: Negative for chest pain, palpitations and leg swelling.  Gastrointestinal: Negative for abdominal pain, diarrhea, nausea and vomiting.  Endocrine: Negative.   Genitourinary: Negative.  Negative for difficulty urinating.  Musculoskeletal: Negative.  Negative for arthralgias and myalgias.  Skin: Negative.  Negative for wound.  Neurological: Negative for dizziness, weakness, light-headedness and headaches.  Hematological: Negative for adenopathy. Does not bruise/bleed easily.    Psychiatric/Behavioral: Negative.     Objective:  BP 132/88 (BP Location: Left Arm, Patient Position: Sitting, Cuff Size: Normal)   Pulse 71   Temp 98 F (36.7 C) (Oral)   Resp 16   Ht 5' 10"  (1.778 m)   Wt 202 lb (91.6 kg)   SpO2 99%   BMI 28.98 kg/m   BP Readings from Last 3 Encounters:  02/10/18 132/88  01/21/18 126/80  06/29/17 120/80    Wt Readings from Last 3 Encounters:  02/10/18 202 lb (91.6 kg)  01/21/18 205 lb (93 kg)  06/29/17 202 lb (91.6 kg)    Physical Exam  Constitutional: He is oriented to person, place, and time. No distress.  HENT:  Mouth/Throat: Oropharynx is clear and moist. No oropharyngeal exudate.  Eyes: Conjunctivae are normal. Left eye exhibits no discharge. No scleral icterus.  Neck: Normal range of motion. Neck supple. No JVD present. No thyromegaly present.  Cardiovascular: Normal rate, regular rhythm and normal heart sounds. Exam reveals no gallop.  No murmur heard. Pulmonary/Chest: Effort normal and breath sounds normal. No respiratory distress. He has no wheezes. He has no rales.  Abdominal: Soft. Bowel sounds are normal. He exhibits no distension and no mass. There is no tenderness.  Musculoskeletal: Normal range of motion. He exhibits no edema, tenderness or deformity.  Lymphadenopathy:    He has no cervical adenopathy.  Neurological: He is alert and oriented to person, place, and time.  Skin: Skin is warm and dry. No rash noted. He is not diaphoretic. No erythema. No pallor.  Vitals reviewed.   Lab Results  Component Value Date   WBC 5.6 02/10/2018   HGB 14.6 02/10/2018   HCT 42.5 02/10/2018   PLT 180.0 02/10/2018   GLUCOSE 92 02/10/2018   CHOL 171 06/29/2017   TRIG 63.0 06/29/2017   HDL 61.10 06/29/2017   LDLCALC 97 06/29/2017   ALT 19 03/13/2017   AST 48 (H) 03/13/2017   NA 138 02/10/2018   K 3.5 02/10/2018   CL 101 02/10/2018   CREATININE 1.00 02/10/2018   BUN 20 02/10/2018   CO2 31 02/10/2018   TSH 2.02 06/29/2017    PSA 0.00 (L) 01/21/2018   HGBA1C 5.6 06/29/2017    No results found.  Assessment & Plan:   Damontre was seen today for hypertension.  Diagnoses and all orders for this visit:  Need for influenza vaccination -     Flu vaccine HIGH DOSE PF (Fluzone High dose)  Essential hypertension, benign- His blood pressure is well controlled.  Electrolytes and renal function are normal.  Will continue the current combination of an ACEI, thiazide, and CCB. -     CBC with Differential/Platelet; Future -     Basic metabolic panel; Future -     amLODipine (NORVASC) 10 MG tablet; Take 1 tablet (10 mg total) by mouth daily. -     benazepril (LOTENSIN) 20 MG tablet; Take 1 tablet (20 mg total) by mouth daily. -     hydrochlorothiazide (HYDRODIURIL) 25 MG tablet; Take 1 tablet (25 mg total) by mouth daily.   I have discontinued Georgia Ashbaugh's sulfamethoxazole-trimethoprim. I have also changed his amLODipine, benazepril, and hydrochlorothiazide. Additionally, I am having him maintain his multivitamin with minerals, ferrous sulfate, and cyanocobalamin.  Meds ordered this encounter  Medications  . amLODipine (NORVASC) 10 MG tablet    Sig: Take 1 tablet (10 mg total) by mouth daily.    Dispense:  90 tablet    Refill:  1  . benazepril (LOTENSIN) 20 MG tablet    Sig: Take 1 tablet (20 mg total) by mouth daily.    Dispense:  90 tablet    Refill:  1  . hydrochlorothiazide (HYDRODIURIL) 25 MG tablet    Sig: Take 1 tablet (25 mg total) by mouth daily.    Dispense:  90 tablet    Refill:  1     Follow-up: Return in about 6 months (around 08/10/2018).  Scarlette Calico, MD

## 2018-02-11 ENCOUNTER — Encounter: Payer: Self-pay | Admitting: Internal Medicine

## 2018-03-25 DIAGNOSIS — H31111 Age-related choroidal atrophy, right eye: Secondary | ICD-10-CM | POA: Diagnosis not present

## 2018-03-25 DIAGNOSIS — H353221 Exudative age-related macular degeneration, left eye, with active choroidal neovascularization: Secondary | ICD-10-CM | POA: Diagnosis not present

## 2018-03-25 DIAGNOSIS — H353113 Nonexudative age-related macular degeneration, right eye, advanced atrophic without subfoveal involvement: Secondary | ICD-10-CM | POA: Diagnosis not present

## 2018-05-11 DIAGNOSIS — H353221 Exudative age-related macular degeneration, left eye, with active choroidal neovascularization: Secondary | ICD-10-CM | POA: Diagnosis not present

## 2018-05-11 DIAGNOSIS — H353113 Nonexudative age-related macular degeneration, right eye, advanced atrophic without subfoveal involvement: Secondary | ICD-10-CM | POA: Diagnosis not present

## 2018-05-11 DIAGNOSIS — H31111 Age-related choroidal atrophy, right eye: Secondary | ICD-10-CM | POA: Diagnosis not present

## 2018-07-06 DIAGNOSIS — H353221 Exudative age-related macular degeneration, left eye, with active choroidal neovascularization: Secondary | ICD-10-CM | POA: Diagnosis not present

## 2018-07-16 NOTE — Progress Notes (Addendum)
Subjective:   Corey [name] is a 67 y.o. male who presents for an Initial Medicare Annual Wellness Visit.  Review of Systems  No ROS.  Medicare Wellness Visit. Additional risk factors are reflected in the social history.  Cardiac Risk Factors include: advanced age (>51mn, >>47women);male gender;dyslipidemia;hypertension Sleep patterns: feels rested on waking, gets up 1 times nightly to void and sleeps 7-8 hours nightly.    Home Safety/Smoke Alarms: Feels safe in home. Smoke alarms in place.  Living environment; residence and Firearm Safety: 1-story house/ trailer, no firearms Lives with husband, no needs for DME, good support system. Seat Belt Safety/Bike Helmet: Wears seat belt.   PSA-  Lab Results  Component Value Date   PSA 0.01 (L) 07/19/2018   PSA 0.00 (L) 01/21/2018   PSA 0.00 (L) 06/29/2017      Objective:    Today's Vitals   07/19/18 0917  BP: 138/78  Pulse: 66  Resp: 18  SpO2: 98%  Weight: 200 lb (90.7 kg)  Height: 5' 10"  (1.778 m)   Body mass index is 28.7 kg/m.  Advanced Directives 07/19/2018 07/02/2017 03/12/2017 03/10/2017  Does Patient Have a Medical Advance Directive? No No No No  Would patient like information on creating a medical advance directive? Yes (ED - Information included in AVS) Yes (Inpatient - patient defers creating a medical advance directive at this time) No - Patient declined Yes (MAU/Ambulatory/Procedural Areas - Information given)    Current Medications (verified) Outpatient Encounter Medications as of 07/19/2018  Medication Sig  . amLODipine (NORVASC) 10 MG tablet Take 1 tablet (10 mg total) by mouth daily.  . benazepril (LOTENSIN) 20 MG tablet Take 1 tablet (20 mg total) by mouth daily.  . cyanocobalamin 2000 MCG tablet Take 1 tablet (2,000 mcg total) by mouth daily.  . ferrous sulfate 325 (65 FE) MG tablet Take 1 tablet (325 mg total) by mouth daily with breakfast.  . hydrochlorothiazide (HYDRODIURIL) 25 MG tablet Take 1 tablet (25  mg total) by mouth daily.  . Multiple Vitamin (MULTIVITAMIN WITH MINERALS) TABS tablet Take 1 tablet by mouth daily.  . naproxen sodium (ALEVE) 220 MG tablet Take 220 mg by mouth daily as needed.   No facility-administered encounter medications on file as of 07/19/2018.     Allergies (verified) Patient has no known allergies.   History: Past Medical History:  Diagnosis Date  . Arthritis    "minor; elbows, wrists" (03/12/2017)  . BPH (benign prostatic hyperplasia) 2011  . Hypertension   . Incisional hernia   . Prostate cancer ([facility] [place]   S/P prostatectomy, TURP, radiation in 2017   Past Surgical History:  Procedure Laterality Date  . HERNIA REPAIR    . INCISIONAL HERNIA REPAIR N/A 03/12/2017   Procedure: LAPAROSCOPIC INCISIONAL HERNIA REPAIR;  Surgeon: TErroll Luna MD;  [facility]  Service: General;  Laterality: N/A;  . INGUINAL HERNIA REPAIR Right ~ 2007  . INSERTION OF MESH N/A 03/12/2017   Procedure: INSERTION OF MESH;  Surgeon: TErroll Luna MD;  [facility]  Service: General;  Laterality: N/A;  . LAPAROSCOPIC INCISIONAL / [place]MBILICAL / [facility] 03/12/2017   IHR w/mesh  . PROSTATECTOMY  2017  . TRANSURETHRAL RESECTION OF PROSTATE  2017   "after they removed my prostate"  . UMBILICAL HERNIA REPAIR  ~ 2012   Family History  Problem Relation Age of Onset  . Cancer Mother   . Alcohol abuse Sister   . Early death Sister   .  Stroke Neg Hx   . Kidney disease Neg Hx   . Hypertension Neg Hx   . Heart disease Neg Hx   . Hyperlipidemia Neg Hx   . Diabetes Neg Hx   . Depression Neg Hx   . COPD Neg Hx   . Asthma Neg Hx    Social History   Socioeconomic History  . Marital status: Married    Spouse name: Not on file  . Number of children: 3  . Years of education: Not on file  . Highest education level: Not on file  Occupational History  . Not on file  Social Needs  . Financial resource strain: Not hard at all  . Food insecurity:    Worry:  Never true    Inability: Never true  . Transportation needs:    Medical: No    Non-medical: No  Tobacco Use  . Smoking status: Never Smoker  . Smokeless tobacco: Never Used  Substance and Sexual Activity  . Alcohol use: Yes    Alcohol/week: 4.2 oz    Types: 7 Glasses of Teanna Elem per week  . Drug use: No  . Sexual activity: Yes    Birth control/protection: None  Lifestyle  . Physical activity:    Days per week: 5 days    Minutes per session: 60 min  . Stress: Not at all  Relationships  . Social connections:    Talks on phone: More than three times a week    Gets together: More than three times a week    Attends religious service: More than 4 times per year    Active member of club or organization: Not on file    Attends meetings of clubs or organizations: More than 4 times per year    Relationship status: Married  Other Topics Concern  . Not on file  Social History Narrative  . Not on file   Tobacco Counseling Counseling given: Not Answered  Activities of Daily Living In your present state of health, do you have any difficulty performing the following activities: 07/19/2018  Hearing? N  Vision? N  Difficulty concentrating or making decisions? N  Walking or climbing stairs? N  Dressing or bathing? N  Doing errands, shopping? N  Preparing Food and eating ? N  Using the Toilet? N  In the past six months, have you accidently leaked urine? N  Do you have problems with loss of bowel control? N  Managing your Medications? N  Managing your Finances? N  Housekeeping or managing your Housekeeping? N  Some recent data might be hidden     Immunizations and Health Maintenance Immunization History  Administered Date(s) Administered  . Influenza, High Dose Seasonal PF 02/10/2018  . Pneumococcal Conjugate-13 06/29/2017  . Pneumococcal Polysaccharide-23 08/05/2013  . Tdap 08/05/2013   There are no preventive care reminders to display for this patient.  Patient Care  Team: Janith Lima, MD as PCP - General (Internal Medicine)  Indicate any recent Medical Services you may have received from other than [facility] providers in the past year (date may be approximate).    Assessment:   This is a routine wellness examination for Corey [name]. Physical assessment deferred to PCP.   Hearing/Vision screen Hearing Screening Comments: Able to hear conversational tones w/o difficulty. No issues reported.  Passed whisper test Vision Screening Comments: appointment yearly [place]  Dietary issues and exercise activities discussed: Current Exercise Habits: The patient has a physically strenous job, but has no regular exercise  apart from work., Type of exercise: walking(walks long distances frequently at work), Time (Minutes): 60, Frequency (Times/Week): 5, Weekly Exercise (Minutes/Week): 300, Intensity: Mild, Exercise limited by: None identified  Diet (meal preparation, eat out, water intake, caffeinated beverages, dairy products, fruits and vegetables): in general, a "healthy" diet  , well balanced eats a variety of fruits and vegetables daily, limits salt, fat/cholesterol, sugar,carbohydrates,caffeine, drinks 6-8 glasses of water daily.  Goals    . Patient Stated     Lose weight by eating healthy. I will increase the amount of salad and cut back on fried foods.       Depression Screen PHQ 2/9 Scores 07/19/2018 07/02/2017  PHQ - 2 Score 0 0  PHQ- 9 Score 0 -    Fall Risk Fall Risk  07/19/2018 07/02/2017  Falls in the past year? No No   Cognitive Function:       Ad8 score reviewed for issues:  Issues making decisions: no  Less interest in hobbies / activities: no  Repeats questions, stories (family complaining): no  Trouble using ordinary gadgets (microwave, computer, phone):no  Forgets the month or year: no  Mismanaging finances: no  Remembering appts: no  Daily problems with thinking and/or memory: no Ad8 score is= 0  Screening  Tests Health Maintenance  Topic Date Due  . INFLUENZA VACCINE  07/29/2018  . PNA vac Low Risk Adult (2 of 2 - PPSV23) 08/05/2018  . TETANUS/TDAP  08/06/2023  . COLONOSCOPY  03/11/2026  . Hepatitis C Screening  Completed      Plan:     Continue doing brain stimulating activities (puzzles, reading, adult coloring books, staying active) to keep memory sharp.   Continue to eat heart healthy diet (full of fruits, vegetables, whole grains, lean protein, water--limit salt, fat, and sugar intake) and increase physical activity as tolerated.  I have personally reviewed and noted the following in the patient's chart:   . Medical and social history . Use of alcohol, tobacco or illicit drugs  . Current medications and supplements . Functional ability and status . Nutritional status . Physical activity . Advanced directives . List of other physicians . Vitals . Screenings to include cognitive, depression, and falls . Referrals and appointments  In addition, I have reviewed and discussed with patient certain preventive protocols, quality metrics, and best practice recommendations. A written personalized care plan for preventive services as well as general preventive health recommendations were provided to patient.     Michiel Cowboy, RN   07/19/2018   Medical screening examination/treatment/procedure(s) were performed by non-physician practitioner and as supervising physician I was immediately available for consultation/collaboration. I agree with above. Scarlette Calico, MD

## 2018-07-19 ENCOUNTER — Other Ambulatory Visit: Payer: Self-pay | Admitting: Internal Medicine

## 2018-07-19 ENCOUNTER — Encounter: Payer: Self-pay | Admitting: Internal Medicine

## 2018-07-19 ENCOUNTER — Ambulatory Visit (INDEPENDENT_AMBULATORY_CARE_PROVIDER_SITE_OTHER): Payer: Medicare HMO | Admitting: *Deleted

## 2018-07-19 ENCOUNTER — Other Ambulatory Visit (INDEPENDENT_AMBULATORY_CARE_PROVIDER_SITE_OTHER): Payer: Medicare HMO

## 2018-07-19 VITALS — BP 138/78 | HR 66 | Resp 18 | Ht 70.0 in | Wt 200.0 lb

## 2018-07-19 DIAGNOSIS — Z0000 Encounter for general adult medical examination without abnormal findings: Secondary | ICD-10-CM

## 2018-07-19 DIAGNOSIS — G8929 Other chronic pain: Secondary | ICD-10-CM | POA: Diagnosis not present

## 2018-07-19 DIAGNOSIS — C61 Malignant neoplasm of prostate: Secondary | ICD-10-CM

## 2018-07-19 DIAGNOSIS — I10 Essential (primary) hypertension: Secondary | ICD-10-CM | POA: Diagnosis not present

## 2018-07-19 DIAGNOSIS — Z809 Family history of malignant neoplasm, unspecified: Secondary | ICD-10-CM | POA: Diagnosis not present

## 2018-07-19 DIAGNOSIS — Z8249 Family history of ischemic heart disease and other diseases of the circulatory system: Secondary | ICD-10-CM | POA: Diagnosis not present

## 2018-07-19 DIAGNOSIS — Z8546 Personal history of malignant neoplasm of prostate: Secondary | ICD-10-CM | POA: Diagnosis not present

## 2018-07-19 DIAGNOSIS — Z791 Long term (current) use of non-steroidal anti-inflammatories (NSAID): Secondary | ICD-10-CM | POA: Diagnosis not present

## 2018-07-19 LAB — PSA: PSA: 0.01 ng/mL — AB (ref 0.10–4.00)

## 2018-07-19 NOTE — Patient Instructions (Addendum)
Continue doing brain stimulating activities (puzzles, reading, adult coloring books, staying active) to keep memory sharp.   Continue to eat heart healthy diet (full of fruits, vegetables, whole grains, lean protein, water--limit salt, fat, and sugar intake) and increase physical activity as tolerated.   Corey [name] , Thank you for taking time to come for your Medicare Wellness Visit. I appreciate your ongoing commitment to your health goals. Please review the following plan we discussed and let me know if I can assist you in the future.   These are the goals we discussed: Goals    . Patient Stated     Lose weight by eating healthy. I will increase the amount of salad and cut back on fried foods.        This is a list of the screening recommended for you and due dates:  Health Maintenance  Topic Date Due  . Flu Shot  07/29/2018  . Pneumonia vaccines (2 of 2 - PPSV23) 08/05/2018  . Tetanus Vaccine  08/06/2023  . Colon Cancer Screening  03/11/2026  .  Hepatitis C: One time screening is recommended by Center for Disease Control  (CDC) for  adults born from [birth year] through 1965.   Completed

## 2018-08-13 ENCOUNTER — Other Ambulatory Visit: Payer: Self-pay | Admitting: Internal Medicine

## 2018-08-13 DIAGNOSIS — I10 Essential (primary) hypertension: Secondary | ICD-10-CM

## 2018-09-06 DIAGNOSIS — H353113 Nonexudative age-related macular degeneration, right eye, advanced atrophic without subfoveal involvement: Secondary | ICD-10-CM | POA: Diagnosis not present

## 2018-09-06 DIAGNOSIS — H2513 Age-related nuclear cataract, bilateral: Secondary | ICD-10-CM | POA: Diagnosis not present

## 2018-09-06 DIAGNOSIS — H353221 Exudative age-related macular degeneration, left eye, with active choroidal neovascularization: Secondary | ICD-10-CM | POA: Diagnosis not present

## 2018-11-01 DIAGNOSIS — H353221 Exudative age-related macular degeneration, left eye, with active choroidal neovascularization: Secondary | ICD-10-CM | POA: Diagnosis not present

## 2018-11-15 ENCOUNTER — Other Ambulatory Visit: Payer: Self-pay | Admitting: Internal Medicine

## 2018-11-15 DIAGNOSIS — I10 Essential (primary) hypertension: Secondary | ICD-10-CM

## 2018-11-30 DIAGNOSIS — N5231 Erectile dysfunction following radical prostatectomy: Secondary | ICD-10-CM | POA: Diagnosis not present

## 2018-11-30 DIAGNOSIS — Z8546 Personal history of malignant neoplasm of prostate: Secondary | ICD-10-CM | POA: Diagnosis not present

## 2018-11-30 DIAGNOSIS — R32 Unspecified urinary incontinence: Secondary | ICD-10-CM | POA: Diagnosis not present

## 2018-11-30 LAB — PSA: PSA: 0.01

## 2018-12-09 DIAGNOSIS — R339 Retention of urine, unspecified: Secondary | ICD-10-CM | POA: Diagnosis not present

## 2018-12-09 DIAGNOSIS — R32 Unspecified urinary incontinence: Secondary | ICD-10-CM | POA: Diagnosis not present

## 2018-12-09 DIAGNOSIS — N400 Enlarged prostate without lower urinary tract symptoms: Secondary | ICD-10-CM | POA: Diagnosis not present

## 2018-12-20 DIAGNOSIS — H2513 Age-related nuclear cataract, bilateral: Secondary | ICD-10-CM | POA: Diagnosis not present

## 2018-12-20 DIAGNOSIS — H353113 Nonexudative age-related macular degeneration, right eye, advanced atrophic without subfoveal involvement: Secondary | ICD-10-CM | POA: Diagnosis not present

## 2018-12-20 DIAGNOSIS — H353221 Exudative age-related macular degeneration, left eye, with active choroidal neovascularization: Secondary | ICD-10-CM | POA: Diagnosis not present

## 2018-12-30 DIAGNOSIS — H4311 Vitreous hemorrhage, right eye: Secondary | ICD-10-CM | POA: Diagnosis not present

## 2018-12-30 DIAGNOSIS — H43392 Other vitreous opacities, left eye: Secondary | ICD-10-CM | POA: Diagnosis not present

## 2018-12-30 DIAGNOSIS — H353221 Exudative age-related macular degeneration, left eye, with active choroidal neovascularization: Secondary | ICD-10-CM | POA: Diagnosis not present

## 2018-12-30 DIAGNOSIS — H43812 Vitreous degeneration, left eye: Secondary | ICD-10-CM | POA: Diagnosis not present

## 2019-01-04 DIAGNOSIS — H524 Presbyopia: Secondary | ICD-10-CM | POA: Diagnosis not present

## 2019-01-04 DIAGNOSIS — H52223 Regular astigmatism, bilateral: Secondary | ICD-10-CM | POA: Diagnosis not present

## 2019-01-04 DIAGNOSIS — N3281 Overactive bladder: Secondary | ICD-10-CM | POA: Diagnosis not present

## 2019-01-04 DIAGNOSIS — N320 Bladder-neck obstruction: Secondary | ICD-10-CM | POA: Diagnosis not present

## 2019-01-04 DIAGNOSIS — R32 Unspecified urinary incontinence: Secondary | ICD-10-CM | POA: Diagnosis not present

## 2019-01-04 DIAGNOSIS — Z9079 Acquired absence of other genital organ(s): Secondary | ICD-10-CM | POA: Diagnosis not present

## 2019-01-04 DIAGNOSIS — H35363 Drusen (degenerative) of macula, bilateral: Secondary | ICD-10-CM | POA: Diagnosis not present

## 2019-01-04 DIAGNOSIS — N390 Urinary tract infection, site not specified: Secondary | ICD-10-CM | POA: Diagnosis not present

## 2019-01-04 DIAGNOSIS — Z08 Encounter for follow-up examination after completed treatment for malignant neoplasm: Secondary | ICD-10-CM | POA: Diagnosis not present

## 2019-01-04 DIAGNOSIS — H353112 Nonexudative age-related macular degeneration, right eye, intermediate dry stage: Secondary | ICD-10-CM | POA: Diagnosis not present

## 2019-01-04 DIAGNOSIS — R339 Retention of urine, unspecified: Secondary | ICD-10-CM | POA: Diagnosis not present

## 2019-01-04 DIAGNOSIS — H353221 Exudative age-related macular degeneration, left eye, with active choroidal neovascularization: Secondary | ICD-10-CM | POA: Diagnosis not present

## 2019-01-04 DIAGNOSIS — Z8546 Personal history of malignant neoplasm of prostate: Secondary | ICD-10-CM | POA: Diagnosis not present

## 2019-01-04 DIAGNOSIS — H5203 Hypermetropia, bilateral: Secondary | ICD-10-CM | POA: Diagnosis not present

## 2019-01-04 DIAGNOSIS — N5231 Erectile dysfunction following radical prostatectomy: Secondary | ICD-10-CM | POA: Diagnosis not present

## 2019-01-04 DIAGNOSIS — N3289 Other specified disorders of bladder: Secondary | ICD-10-CM | POA: Diagnosis not present

## 2019-01-04 DIAGNOSIS — H2513 Age-related nuclear cataract, bilateral: Secondary | ICD-10-CM | POA: Diagnosis not present

## 2019-01-05 ENCOUNTER — Telehealth: Payer: Self-pay

## 2019-01-05 DIAGNOSIS — C61 Malignant neoplasm of prostate: Secondary | ICD-10-CM

## 2019-01-05 DIAGNOSIS — R32 Unspecified urinary incontinence: Secondary | ICD-10-CM

## 2019-01-05 NOTE — Telephone Encounter (Signed)
Copied from [place] [id number]. Topic: Referral - Request for Referral >> Jan 05, 2019  9:40 AM Rutherford Nail, NT wrote: Has patient seen PCP for this complaint? Yes.   *If NO, is insurance requiring patient see PCP for this issue before PCP can refer them? Referral for which specialty: Urology Preferred provider/office: Prefers an office in [place], [place] due to living in [place], [place] Reason for referral: Does not agree/get along with current urologist

## 2019-01-06 DIAGNOSIS — R339 Retention of urine, unspecified: Secondary | ICD-10-CM | POA: Diagnosis not present

## 2019-01-07 DIAGNOSIS — N529 Male erectile dysfunction, unspecified: Secondary | ICD-10-CM | POA: Diagnosis not present

## 2019-01-07 DIAGNOSIS — N320 Bladder-neck obstruction: Secondary | ICD-10-CM | POA: Diagnosis not present

## 2019-01-07 DIAGNOSIS — Z8546 Personal history of malignant neoplasm of prostate: Secondary | ICD-10-CM | POA: Diagnosis not present

## 2019-01-10 DIAGNOSIS — H2512 Age-related nuclear cataract, left eye: Secondary | ICD-10-CM | POA: Diagnosis not present

## 2019-01-10 DIAGNOSIS — H353222 Exudative age-related macular degeneration, left eye, with inactive choroidal neovascularization: Secondary | ICD-10-CM | POA: Diagnosis not present

## 2019-01-10 DIAGNOSIS — H2513 Age-related nuclear cataract, bilateral: Secondary | ICD-10-CM | POA: Diagnosis not present

## 2019-01-13 DIAGNOSIS — Y846 Urinary catheterization as the cause of abnormal reaction of the patient, or of later complication, without mention of misadventure at the time of the procedure: Secondary | ICD-10-CM | POA: Diagnosis not present

## 2019-01-13 DIAGNOSIS — T83018A Breakdown (mechanical) of other indwelling urethral catheter, initial encounter: Secondary | ICD-10-CM | POA: Diagnosis not present

## 2019-01-13 DIAGNOSIS — R319 Hematuria, unspecified: Secondary | ICD-10-CM | POA: Diagnosis not present

## 2019-01-14 DIAGNOSIS — H2512 Age-related nuclear cataract, left eye: Secondary | ICD-10-CM | POA: Insufficient documentation

## 2019-01-14 DIAGNOSIS — N390 Urinary tract infection, site not specified: Secondary | ICD-10-CM | POA: Diagnosis not present

## 2019-01-15 DIAGNOSIS — N3289 Other specified disorders of bladder: Secondary | ICD-10-CM | POA: Diagnosis not present

## 2019-01-15 DIAGNOSIS — R319 Hematuria, unspecified: Secondary | ICD-10-CM | POA: Diagnosis not present

## 2019-01-15 DIAGNOSIS — R310 Gross hematuria: Secondary | ICD-10-CM | POA: Diagnosis not present

## 2019-01-15 DIAGNOSIS — C61 Malignant neoplasm of prostate: Secondary | ICD-10-CM | POA: Diagnosis not present

## 2019-01-15 DIAGNOSIS — R339 Retention of urine, unspecified: Secondary | ICD-10-CM | POA: Diagnosis not present

## 2019-01-16 DIAGNOSIS — N3289 Other specified disorders of bladder: Secondary | ICD-10-CM | POA: Diagnosis not present

## 2019-01-16 DIAGNOSIS — C61 Malignant neoplasm of prostate: Secondary | ICD-10-CM | POA: Diagnosis not present

## 2019-01-16 DIAGNOSIS — R310 Gross hematuria: Secondary | ICD-10-CM | POA: Diagnosis not present

## 2019-01-17 DIAGNOSIS — C61 Malignant neoplasm of prostate: Secondary | ICD-10-CM | POA: Diagnosis not present

## 2019-01-17 DIAGNOSIS — T83091A Other mechanical complication of indwelling urethral catheter, initial encounter: Secondary | ICD-10-CM | POA: Diagnosis not present

## 2019-01-17 DIAGNOSIS — I10 Essential (primary) hypertension: Secondary | ICD-10-CM | POA: Diagnosis not present

## 2019-01-17 DIAGNOSIS — N3289 Other specified disorders of bladder: Secondary | ICD-10-CM | POA: Diagnosis not present

## 2019-01-17 DIAGNOSIS — R310 Gross hematuria: Secondary | ICD-10-CM | POA: Diagnosis not present

## 2019-01-17 DIAGNOSIS — Z8546 Personal history of malignant neoplasm of prostate: Secondary | ICD-10-CM | POA: Diagnosis not present

## 2019-01-19 DIAGNOSIS — H353222 Exudative age-related macular degeneration, left eye, with inactive choroidal neovascularization: Secondary | ICD-10-CM | POA: Diagnosis not present

## 2019-01-19 DIAGNOSIS — H2512 Age-related nuclear cataract, left eye: Secondary | ICD-10-CM | POA: Diagnosis not present

## 2019-01-19 DIAGNOSIS — I10 Essential (primary) hypertension: Secondary | ICD-10-CM | POA: Diagnosis not present

## 2019-01-19 DIAGNOSIS — H2513 Age-related nuclear cataract, bilateral: Secondary | ICD-10-CM | POA: Diagnosis not present

## 2019-01-19 DIAGNOSIS — Z961 Presence of intraocular lens: Secondary | ICD-10-CM | POA: Diagnosis not present

## 2019-01-20 DIAGNOSIS — R339 Retention of urine, unspecified: Secondary | ICD-10-CM | POA: Diagnosis not present

## 2019-01-20 DIAGNOSIS — N529 Male erectile dysfunction, unspecified: Secondary | ICD-10-CM | POA: Diagnosis not present

## 2019-01-20 DIAGNOSIS — R310 Gross hematuria: Secondary | ICD-10-CM | POA: Diagnosis not present

## 2019-01-20 DIAGNOSIS — N320 Bladder-neck obstruction: Secondary | ICD-10-CM | POA: Diagnosis not present

## 2019-01-20 DIAGNOSIS — Z8546 Personal history of malignant neoplasm of prostate: Secondary | ICD-10-CM | POA: Diagnosis not present

## 2019-01-20 DIAGNOSIS — R32 Unspecified urinary incontinence: Secondary | ICD-10-CM | POA: Diagnosis not present

## 2019-01-25 DIAGNOSIS — N3289 Other specified disorders of bladder: Secondary | ICD-10-CM | POA: Diagnosis not present

## 2019-01-25 DIAGNOSIS — R310 Gross hematuria: Secondary | ICD-10-CM | POA: Diagnosis not present

## 2019-01-25 DIAGNOSIS — N3041 Irradiation cystitis with hematuria: Secondary | ICD-10-CM | POA: Diagnosis not present

## 2019-01-25 DIAGNOSIS — Z8546 Personal history of malignant neoplasm of prostate: Secondary | ICD-10-CM | POA: Diagnosis not present

## 2019-01-25 DIAGNOSIS — R339 Retention of urine, unspecified: Secondary | ICD-10-CM | POA: Diagnosis not present

## 2019-01-25 DIAGNOSIS — I10 Essential (primary) hypertension: Secondary | ICD-10-CM | POA: Diagnosis not present

## 2019-01-25 DIAGNOSIS — N4289 Other specified disorders of prostate: Secondary | ICD-10-CM | POA: Diagnosis not present

## 2019-01-25 DIAGNOSIS — T83098A Other mechanical complication of other indwelling urethral catheter, initial encounter: Secondary | ICD-10-CM | POA: Diagnosis not present

## 2019-01-25 DIAGNOSIS — Z79899 Other long term (current) drug therapy: Secondary | ICD-10-CM | POA: Diagnosis not present

## 2019-01-26 DIAGNOSIS — N4289 Other specified disorders of prostate: Secondary | ICD-10-CM | POA: Diagnosis not present

## 2019-01-26 DIAGNOSIS — Z8546 Personal history of malignant neoplasm of prostate: Secondary | ICD-10-CM | POA: Diagnosis not present

## 2019-01-26 DIAGNOSIS — R339 Retention of urine, unspecified: Secondary | ICD-10-CM | POA: Diagnosis not present

## 2019-01-26 DIAGNOSIS — I10 Essential (primary) hypertension: Secondary | ICD-10-CM | POA: Diagnosis not present

## 2019-01-26 DIAGNOSIS — Z9889 Other specified postprocedural states: Secondary | ICD-10-CM | POA: Diagnosis not present

## 2019-01-26 DIAGNOSIS — N3289 Other specified disorders of bladder: Secondary | ICD-10-CM | POA: Diagnosis not present

## 2019-01-26 DIAGNOSIS — N3041 Irradiation cystitis with hematuria: Secondary | ICD-10-CM | POA: Diagnosis not present

## 2019-01-27 DIAGNOSIS — R339 Retention of urine, unspecified: Secondary | ICD-10-CM | POA: Diagnosis not present

## 2019-01-27 DIAGNOSIS — Z9889 Other specified postprocedural states: Secondary | ICD-10-CM | POA: Diagnosis not present

## 2019-01-27 DIAGNOSIS — Z8546 Personal history of malignant neoplasm of prostate: Secondary | ICD-10-CM | POA: Diagnosis not present

## 2019-01-27 DIAGNOSIS — N3041 Irradiation cystitis with hematuria: Secondary | ICD-10-CM | POA: Diagnosis not present

## 2019-01-28 DIAGNOSIS — N3041 Irradiation cystitis with hematuria: Secondary | ICD-10-CM | POA: Diagnosis not present

## 2019-01-28 DIAGNOSIS — R339 Retention of urine, unspecified: Secondary | ICD-10-CM | POA: Diagnosis not present

## 2019-01-28 DIAGNOSIS — Z9889 Other specified postprocedural states: Secondary | ICD-10-CM | POA: Diagnosis not present

## 2019-01-28 DIAGNOSIS — Z8546 Personal history of malignant neoplasm of prostate: Secondary | ICD-10-CM | POA: Diagnosis not present

## 2019-02-03 DIAGNOSIS — N3040 Irradiation cystitis without hematuria: Secondary | ICD-10-CM | POA: Insufficient documentation

## 2019-02-03 DIAGNOSIS — N393 Stress incontinence (female) (male): Secondary | ICD-10-CM | POA: Diagnosis not present

## 2019-02-03 DIAGNOSIS — S3720XD Unspecified injury of bladder, subsequent encounter: Secondary | ICD-10-CM | POA: Diagnosis not present

## 2019-02-03 DIAGNOSIS — R338 Other retention of urine: Secondary | ICD-10-CM | POA: Diagnosis not present

## 2019-02-03 DIAGNOSIS — R310 Gross hematuria: Secondary | ICD-10-CM | POA: Diagnosis not present

## 2019-02-03 DIAGNOSIS — N320 Bladder-neck obstruction: Secondary | ICD-10-CM | POA: Diagnosis not present

## 2019-02-03 DIAGNOSIS — R32 Unspecified urinary incontinence: Secondary | ICD-10-CM | POA: Diagnosis not present

## 2019-02-03 DIAGNOSIS — X58XXXD Exposure to other specified factors, subsequent encounter: Secondary | ICD-10-CM | POA: Diagnosis not present

## 2019-02-03 DIAGNOSIS — N528 Other male erectile dysfunction: Secondary | ICD-10-CM | POA: Diagnosis not present

## 2019-02-03 DIAGNOSIS — Z8546 Personal history of malignant neoplasm of prostate: Secondary | ICD-10-CM | POA: Diagnosis not present

## 2019-02-04 ENCOUNTER — Other Ambulatory Visit: Payer: Self-pay | Admitting: Internal Medicine

## 2019-02-04 DIAGNOSIS — I10 Essential (primary) hypertension: Secondary | ICD-10-CM

## 2019-02-04 DIAGNOSIS — R310 Gross hematuria: Secondary | ICD-10-CM | POA: Diagnosis not present

## 2019-02-04 DIAGNOSIS — R32 Unspecified urinary incontinence: Secondary | ICD-10-CM | POA: Diagnosis not present

## 2019-02-07 DIAGNOSIS — H43812 Vitreous degeneration, left eye: Secondary | ICD-10-CM | POA: Diagnosis not present

## 2019-02-07 DIAGNOSIS — H353221 Exudative age-related macular degeneration, left eye, with active choroidal neovascularization: Secondary | ICD-10-CM | POA: Diagnosis not present

## 2019-02-07 DIAGNOSIS — H4312 Vitreous hemorrhage, left eye: Secondary | ICD-10-CM | POA: Diagnosis not present

## 2019-02-07 DIAGNOSIS — H353113 Nonexudative age-related macular degeneration, right eye, advanced atrophic without subfoveal involvement: Secondary | ICD-10-CM | POA: Diagnosis not present

## 2019-02-08 DIAGNOSIS — Z8546 Personal history of malignant neoplasm of prostate: Secondary | ICD-10-CM | POA: Diagnosis not present

## 2019-02-08 DIAGNOSIS — N320 Bladder-neck obstruction: Secondary | ICD-10-CM | POA: Diagnosis not present

## 2019-02-08 DIAGNOSIS — R32 Unspecified urinary incontinence: Secondary | ICD-10-CM | POA: Diagnosis not present

## 2019-02-08 DIAGNOSIS — X58XXXD Exposure to other specified factors, subsequent encounter: Secondary | ICD-10-CM | POA: Diagnosis not present

## 2019-02-08 DIAGNOSIS — N9972 Accidental puncture and laceration of a genitourinary system organ or structure during other procedure: Secondary | ICD-10-CM | POA: Diagnosis not present

## 2019-02-08 DIAGNOSIS — S3720XD Unspecified injury of bladder, subsequent encounter: Secondary | ICD-10-CM | POA: Diagnosis not present

## 2019-02-24 DIAGNOSIS — N3040 Irradiation cystitis without hematuria: Secondary | ICD-10-CM | POA: Diagnosis not present

## 2019-02-24 DIAGNOSIS — N528 Other male erectile dysfunction: Secondary | ICD-10-CM | POA: Diagnosis not present

## 2019-02-24 DIAGNOSIS — N320 Bladder-neck obstruction: Secondary | ICD-10-CM | POA: Diagnosis not present

## 2019-02-24 DIAGNOSIS — R338 Other retention of urine: Secondary | ICD-10-CM | POA: Diagnosis not present

## 2019-02-24 DIAGNOSIS — S3720XD Unspecified injury of bladder, subsequent encounter: Secondary | ICD-10-CM | POA: Diagnosis not present

## 2019-02-24 DIAGNOSIS — N9972 Accidental puncture and laceration of a genitourinary system organ or structure during other procedure: Secondary | ICD-10-CM | POA: Diagnosis not present

## 2019-02-24 DIAGNOSIS — N393 Stress incontinence (female) (male): Secondary | ICD-10-CM | POA: Diagnosis not present

## 2019-03-08 ENCOUNTER — Telehealth: Payer: Self-pay | Admitting: Internal Medicine

## 2019-03-08 DIAGNOSIS — I10 Essential (primary) hypertension: Secondary | ICD-10-CM

## 2019-03-08 NOTE — Telephone Encounter (Signed)
Pt needs an appt scheduled before any refills can be sent in.   Can you give him a call please?

## 2019-03-11 NOTE — Telephone Encounter (Signed)
Left message for patient to call back to schedule.  °

## 2019-03-14 DIAGNOSIS — Z0100 Encounter for examination of eyes and vision without abnormal findings: Secondary | ICD-10-CM | POA: Diagnosis not present

## 2019-03-28 DIAGNOSIS — H353221 Exudative age-related macular degeneration, left eye, with active choroidal neovascularization: Secondary | ICD-10-CM | POA: Diagnosis not present

## 2019-05-09 ENCOUNTER — Other Ambulatory Visit: Payer: Self-pay | Admitting: Internal Medicine

## 2019-05-09 DIAGNOSIS — H3530 Unspecified macular degeneration: Secondary | ICD-10-CM | POA: Diagnosis not present

## 2019-05-09 DIAGNOSIS — I10 Essential (primary) hypertension: Secondary | ICD-10-CM | POA: Diagnosis not present

## 2019-05-09 DIAGNOSIS — M2550 Pain in unspecified joint: Secondary | ICD-10-CM | POA: Diagnosis not present

## 2019-05-09 DIAGNOSIS — K219 Gastro-esophageal reflux disease without esophagitis: Secondary | ICD-10-CM | POA: Diagnosis not present

## 2019-05-09 DIAGNOSIS — N529 Male erectile dysfunction, unspecified: Secondary | ICD-10-CM | POA: Diagnosis not present

## 2019-05-09 DIAGNOSIS — Z8249 Family history of ischemic heart disease and other diseases of the circulatory system: Secondary | ICD-10-CM | POA: Diagnosis not present

## 2019-05-09 DIAGNOSIS — R32 Unspecified urinary incontinence: Secondary | ICD-10-CM | POA: Diagnosis not present

## 2019-05-09 DIAGNOSIS — C61 Malignant neoplasm of prostate: Secondary | ICD-10-CM | POA: Diagnosis not present

## 2019-05-09 DIAGNOSIS — G8929 Other chronic pain: Secondary | ICD-10-CM | POA: Diagnosis not present

## 2019-05-09 DIAGNOSIS — Z791 Long term (current) use of non-steroidal anti-inflammatories (NSAID): Secondary | ICD-10-CM | POA: Diagnosis not present

## 2019-05-10 ENCOUNTER — Other Ambulatory Visit: Payer: Self-pay

## 2019-05-10 ENCOUNTER — Other Ambulatory Visit (INDEPENDENT_AMBULATORY_CARE_PROVIDER_SITE_OTHER): Payer: Medicare HMO

## 2019-05-10 ENCOUNTER — Ambulatory Visit (INDEPENDENT_AMBULATORY_CARE_PROVIDER_SITE_OTHER): Payer: Medicare HMO | Admitting: Internal Medicine

## 2019-05-10 ENCOUNTER — Encounter: Payer: Self-pay | Admitting: Internal Medicine

## 2019-05-10 VITALS — BP 130/80 | HR 72 | Temp 98.5°F | Resp 16 | Ht 70.0 in | Wt 207.0 lb

## 2019-05-10 DIAGNOSIS — I10 Essential (primary) hypertension: Secondary | ICD-10-CM

## 2019-05-10 DIAGNOSIS — R7989 Other specified abnormal findings of blood chemistry: Secondary | ICD-10-CM

## 2019-05-10 DIAGNOSIS — E785 Hyperlipidemia, unspecified: Secondary | ICD-10-CM

## 2019-05-10 DIAGNOSIS — R739 Hyperglycemia, unspecified: Secondary | ICD-10-CM

## 2019-05-10 DIAGNOSIS — R945 Abnormal results of liver function studies: Secondary | ICD-10-CM

## 2019-05-10 DIAGNOSIS — Z23 Encounter for immunization: Secondary | ICD-10-CM

## 2019-05-10 LAB — CBC WITH DIFFERENTIAL/PLATELET
Basophils Absolute: 0.1 10*3/uL (ref 0.0–0.1)
Basophils Relative: 1 % (ref 0.0–3.0)
Eosinophils Absolute: 0.1 10*3/uL (ref 0.0–0.7)
Eosinophils Relative: 1.2 % (ref 0.0–5.0)
HCT: 41.4 % (ref 39.0–52.0)
Hemoglobin: 13.9 g/dL (ref 13.0–17.0)
Lymphocytes Relative: 12.6 % (ref 12.0–46.0)
Lymphs Abs: 1 10*3/uL (ref 0.7–4.0)
MCHC: 33.4 g/dL (ref 30.0–36.0)
MCV: 81.8 fl (ref 78.0–100.0)
Monocytes Absolute: 0.8 10*3/uL (ref 0.1–1.0)
Monocytes Relative: 10.1 % (ref 3.0–12.0)
Neutro Abs: 5.7 10*3/uL (ref 1.4–7.7)
Neutrophils Relative %: 75.1 % (ref 43.0–77.0)
Platelets: 162 10*3/uL (ref 150.0–400.0)
RBC: 5.07 Mil/uL (ref 4.22–5.81)
RDW: 14.6 % (ref 11.5–15.5)
WBC: 7.6 10*3/uL (ref 4.0–10.5)

## 2019-05-10 MED ORDER — HYDROCHLOROTHIAZIDE 25 MG PO TABS: 25.0000 mg | ORAL_TABLET | Freq: Every day | ORAL | 1 refills | Status: DC

## 2019-05-10 MED ORDER — BENAZEPRIL HCL 20 MG PO TABS: 20.0000 mg | ORAL_TABLET | Freq: Every day | ORAL | 1 refills | Status: DC

## 2019-05-10 MED ORDER — AMLODIPINE BESYLATE 10 MG PO TABS: 10.0000 mg | ORAL_TABLET | Freq: Every day | ORAL | 1 refills | Status: DC

## 2019-05-10 NOTE — Progress Notes (Signed)
Subjective:  Patient ID: Corey [name], male    DOB: 1951-12-21  Age: 68 y.o. MRN: [id number]  CC: Hypertension and Hyperlipidemia   HPI Corey [name] presents for f/up - Corey complains of wt gain.  Corey tells me his blood pressure has been well controlled.  Corey [name] very active and denies any recent episodes of CP, DOE, palpitations, edema, or fatigue.  Outpatient Medications Prior to Visit  Medication Sig Dispense Refill  . cyanocobalamin 2000 MCG tablet Take 1 tablet (2,000 mcg total) by mouth daily. 90 tablet 3  . ferrous sulfate 325 (65 FE) MG tablet Take 1 tablet (325 mg total) by mouth daily with breakfast. 90 tablet 1  . Multiple Vitamin (MULTIVITAMIN WITH MINERALS) TABS tablet Take 1 tablet by mouth daily.    Marland Kitchen amLODipine (NORVASC) 10 MG tablet TAKE 1 TABLET BY MOUTH EVERY DAY 90 tablet 1  . benazepril (LOTENSIN) 20 MG tablet TAKE 1 TABLET BY MOUTH EVERY DAY 90 tablet 1  . hydrochlorothiazide (HYDRODIURIL) 25 MG tablet TAKE 1 TABLET BY MOUTH EVERY DAY 90 tablet 1  . naproxen sodium (ALEVE) 220 MG tablet Take 220 mg by mouth daily as needed.     No facility-administered medications prior to visit.     ROS Review of Systems  Constitutional: Positive for unexpected weight change (wt gain). Negative for diaphoresis and fatigue.  HENT: Negative.   Eyes: Negative for visual disturbance.  Respiratory: Negative for cough, chest tightness, shortness of breath and wheezing.   Cardiovascular: Negative for chest pain, palpitations and leg swelling.  Gastrointestinal: Negative for abdominal pain, diarrhea, nausea and vomiting.  Endocrine: Negative.  Negative for polydipsia, polyphagia and polyuria.  Genitourinary: Negative.  Negative for difficulty urinating.  Musculoskeletal: Negative.   Skin: Negative.   Neurological: Negative.  Negative for dizziness, weakness and light-headedness.  Hematological: Negative for adenopathy. Does not bruise/bleed easily.  Psychiatric/Behavioral: Negative.     Objective:  BP 130/80 (BP Location: Left Arm, Patient Position: Sitting, Cuff Size: Normal)   Pulse 72   Temp 98.5 F (36.9 C) (Oral)   Resp 16   Ht 5' 10"  (1.778 m)   Wt 207 lb (93.9 kg)   SpO2 96%   BMI 29.70 kg/m   BP Readings from Last 3 Encounters:  05/10/19 130/80  07/19/18 138/78  02/10/18 132/88    Wt Readings from Last 3 Encounters:  05/10/19 207 lb (93.9 kg)  07/19/18 200 lb (90.7 kg)  02/10/18 202 lb (91.6 kg)    Physical Exam Vitals signs reviewed.  Constitutional:      Appearance: Corey [name] obese. Corey [name] not ill-appearing or diaphoretic.  HENT:     Nose: Nose normal. No congestion.     Mouth/Throat:     Mouth: Mucous membranes are moist.     Pharynx: Oropharynx [name] clear. No oropharyngeal exudate.  Eyes:     General: No scleral icterus.    Conjunctiva/sclera: Conjunctivae normal.  Neck:     Musculoskeletal: Normal range of motion and neck supple. No muscular tenderness.  Cardiovascular:     Rate and Rhythm: Normal rate and regular rhythm.     Heart sounds: No murmur. No gallop.   Pulmonary:     Effort: Pulmonary effort [name] normal. No respiratory distress.     Breath sounds: No stridor. No wheezing, rhonchi or rales.  Abdominal:     General: Abdomen [name] protuberant.     Palpations: Abdomen [name] soft. There [name] no hepatomegaly, splenomegaly or mass.  Tenderness: There [name] no abdominal tenderness. There [name] no guarding.  Musculoskeletal: Normal range of motion.     Right lower leg: No edema.     Left lower leg: No edema.  Lymphadenopathy:     Cervical: No cervical adenopathy.  Skin:    General: Skin [name] warm and dry.  Neurological:     General: No focal deficit present.  Psychiatric:        Mood and Affect: Mood normal.        Behavior: Behavior normal.     Lab Results  Component Value Date   WBC 7.6 05/10/2019   HGB 13.9 05/10/2019   HCT 41.4 05/10/2019   PLT 162.0 05/10/2019   GLUCOSE 88 05/10/2019   CHOL 174 05/10/2019   TRIG 49.0 05/10/2019    HDL 71.70 05/10/2019   LDLCALC 93 05/10/2019   ALT 26 05/10/2019   AST 63 (H) 05/10/2019   NA 138 05/10/2019   K 3.8 05/10/2019   CL 104 05/10/2019   CREATININE 1.13 05/10/2019   BUN 22 05/10/2019   CO2 26 05/10/2019   TSH 2.48 05/10/2019   PSA 0.01 11/30/2018   HGBA1C 6.2 05/10/2019    No results found.  Assessment & Plan:   Corey [name] was seen today for hypertension and hyperlipidemia.  Diagnoses and all orders for this visit:  Need for pneumococcal vaccination -     Pneumococcal polysaccharide vaccine 23-valent greater than or equal to 2yo subcutaneous/IM  Essential hypertension, benign- His blood pressure [name] adequately well controlled.  Electrolytes and renal function are normal. -     CBC with Differential/Platelet; Future -     Comprehensive metabolic panel; Future -     TSH; Future -     hydrochlorothiazide (HYDRODIURIL) 25 MG tablet; Take 1 tablet (25 mg total) by mouth daily. -     benazepril (LOTENSIN) 20 MG tablet; Take 1 tablet (20 mg total) by mouth daily. -     amLODipine (NORVASC) 10 MG tablet; Take 1 tablet (10 mg total) by mouth daily.  Hyperglycemia- His A1c [name] up to 6.2%.  Corey [name] prediabetic.  Medical therapy [name] not indicated.  I have asked him to improve his lifestyle modifications. -     Comprehensive metabolic panel; Future -     Hemoglobin A1c; Future  Hyperlipidemia with target LDL less than 130-Corey has achieved his LDL goal and [name] doing well on the statin. -     Lipid panel; Future -     Comprehensive metabolic panel; Future -     TSH; Future  Elevated LFTs- I have ordered an ultrasound to screen for NASH. -     US Abdomen Limited RUQ; Future   I have discontinued Corey [name]'s naproxen sodium. I have also changed his hydrochlorothiazide, benazepril, and amLODipine. Additionally, I am having him maintain his multivitamin with minerals, ferrous sulfate, and cyanocobalamin.  Meds ordered this encounter  Medications  . hydrochlorothiazide  (HYDRODIURIL) 25 MG tablet    Sig: Take 1 tablet (25 mg total) by mouth daily.    Dispense:  90 tablet    Refill:  1  . benazepril (LOTENSIN) 20 MG tablet    Sig: Take 1 tablet (20 mg total) by mouth daily.    Dispense:  90 tablet    Refill:  1  . amLODipine (NORVASC) 10 MG tablet    Sig: Take 1 tablet (10 mg total) by mouth daily.    Dispense:  90 tablet    Refill:  1     Follow-up: No follow-ups on file.  Scarlette Calico, MD

## 2019-05-11 LAB — LIPID PANEL
Cholesterol: 174 mg/dL (ref 0–200)
HDL: 71.7 mg/dL (ref >39.00)
LDL Cholesterol: 93 mg/dL (ref 0–99)
NonHDL: 102.4
Total CHOL/HDL Ratio: 2
Triglycerides: 49 mg/dL (ref 0.0–149.0)
VLDL: 9.8 mg/dL (ref 0.0–40.0)

## 2019-05-11 LAB — COMPREHENSIVE METABOLIC PANEL
ALT: 26 U/L (ref 0–53)
AST: 63 U/L — ABNORMAL HIGH (ref 0–37)
Albumin: 4.4 g/dL (ref 3.5–5.2)
Alkaline Phosphatase: 80 U/L (ref 39–117)
BUN: 22 mg/dL (ref 6–23)
CO2: 26 mEq/L (ref 19–32)
Calcium: 8.7 mg/dL (ref 8.4–10.5)
Chloride: 104 mEq/L (ref 96–112)
Creatinine, Ser: 1.13 mg/dL (ref 0.40–1.50)
GFR: 64.58 mL/min (ref >60.00)
Glucose, Bld: 88 mg/dL (ref 70–99)
Potassium: 3.8 mEq/L (ref 3.5–5.1)
Sodium: 138 mEq/L (ref 135–145)
Total Bilirubin: 0.3 mg/dL (ref 0.2–1.2)
Total Protein: 7.3 g/dL (ref 6.0–8.3)

## 2019-05-11 LAB — TSH: TSH: 2.48 u[IU]/mL (ref 0.35–4.50)

## 2019-05-11 LAB — HEMOGLOBIN A1C: Hgb A1c MFr Bld: 6.2 % (ref 4.6–6.5)

## 2019-05-12 ENCOUNTER — Encounter: Payer: Self-pay | Admitting: Internal Medicine

## 2019-05-12 DIAGNOSIS — R7989 Other specified abnormal findings of blood chemistry: Secondary | ICD-10-CM | POA: Insufficient documentation

## 2019-05-12 NOTE — Patient Instructions (Signed)

## 2019-05-16 DIAGNOSIS — H353221 Exudative age-related macular degeneration, left eye, with active choroidal neovascularization: Secondary | ICD-10-CM | POA: Diagnosis not present

## 2019-05-16 NOTE — Addendum Note (Signed)
Addended by: Karle Barr on: 05/16/2019 04:18 PM   Modules accepted: Orders

## 2019-05-27 DIAGNOSIS — N9972 Accidental puncture and laceration of a genitourinary system organ or structure during other procedure: Secondary | ICD-10-CM | POA: Diagnosis not present

## 2019-05-27 DIAGNOSIS — N393 Stress incontinence (female) (male): Secondary | ICD-10-CM | POA: Diagnosis not present

## 2019-05-27 DIAGNOSIS — R32 Unspecified urinary incontinence: Secondary | ICD-10-CM | POA: Diagnosis not present

## 2019-05-27 DIAGNOSIS — N3040 Irradiation cystitis without hematuria: Secondary | ICD-10-CM | POA: Diagnosis not present

## 2019-06-13 ENCOUNTER — Ambulatory Visit
Admission: RE | Admit: 2019-06-13 | Discharge: 2019-06-13 | Disposition: A | Discharge status: Home / Self Care | Payer: Medicare HMO | Source: Ambulatory Visit | Attending: Internal Medicine | Admitting: Internal Medicine

## 2019-06-13 DIAGNOSIS — R945 Abnormal results of liver function studies: Secondary | ICD-10-CM | POA: Diagnosis not present

## 2019-06-13 DIAGNOSIS — R7989 Other specified abnormal findings of blood chemistry: Secondary | ICD-10-CM

## 2019-06-14 ENCOUNTER — Encounter: Payer: Self-pay | Admitting: Internal Medicine

## 2019-06-14 ENCOUNTER — Other Ambulatory Visit: Payer: Self-pay | Admitting: Internal Medicine

## 2019-06-14 DIAGNOSIS — K7581 Nonalcoholic steatohepatitis (NASH): Secondary | ICD-10-CM

## 2019-06-14 MED ORDER — PIOGLITAZONE HCL 15 MG PO TABS: 15.0000 mg | ORAL_TABLET | Freq: Every day | ORAL | 1 refills | Status: DC

## 2019-06-20 DIAGNOSIS — R32 Unspecified urinary incontinence: Secondary | ICD-10-CM | POA: Diagnosis not present

## 2019-07-04 DIAGNOSIS — H353221 Exudative age-related macular degeneration, left eye, with active choroidal neovascularization: Secondary | ICD-10-CM | POA: Diagnosis not present

## 2019-07-11 DIAGNOSIS — Z1159 Encounter for screening for other viral diseases: Secondary | ICD-10-CM | POA: Diagnosis not present

## 2019-07-11 DIAGNOSIS — Z20828 Contact with and (suspected) exposure to other viral communicable diseases: Secondary | ICD-10-CM | POA: Diagnosis not present

## 2019-07-11 DIAGNOSIS — Z01812 Encounter for preprocedural laboratory examination: Secondary | ICD-10-CM | POA: Diagnosis not present

## 2019-07-11 DIAGNOSIS — N393 Stress incontinence (female) (male): Secondary | ICD-10-CM | POA: Diagnosis not present

## 2019-07-18 DIAGNOSIS — I10 Essential (primary) hypertension: Secondary | ICD-10-CM | POA: Diagnosis not present

## 2019-07-18 DIAGNOSIS — R52 Pain, unspecified: Secondary | ICD-10-CM | POA: Diagnosis not present

## 2019-07-18 DIAGNOSIS — Z8546 Personal history of malignant neoplasm of prostate: Secondary | ICD-10-CM | POA: Diagnosis not present

## 2019-07-18 DIAGNOSIS — Z7984 Long term (current) use of oral hypoglycemic drugs: Secondary | ICD-10-CM | POA: Diagnosis not present

## 2019-07-18 DIAGNOSIS — E119 Type 2 diabetes mellitus without complications: Secondary | ICD-10-CM | POA: Diagnosis not present

## 2019-07-18 DIAGNOSIS — N393 Stress incontinence (female) (male): Secondary | ICD-10-CM | POA: Diagnosis not present

## 2019-09-08 DIAGNOSIS — H353221 Exudative age-related macular degeneration, left eye, with active choroidal neovascularization: Secondary | ICD-10-CM | POA: Diagnosis not present

## 2019-09-08 DIAGNOSIS — H35373 Puckering of macula, bilateral: Secondary | ICD-10-CM | POA: Diagnosis not present

## 2019-09-08 DIAGNOSIS — H43812 Vitreous degeneration, left eye: Secondary | ICD-10-CM | POA: Diagnosis not present

## 2019-09-08 DIAGNOSIS — H353112 Nonexudative age-related macular degeneration, right eye, intermediate dry stage: Secondary | ICD-10-CM | POA: Diagnosis not present

## 2019-09-08 LAB — HM DIABETES EYE EXAM

## 2019-09-23 DIAGNOSIS — R69 Illness, unspecified: Secondary | ICD-10-CM | POA: Diagnosis not present

## 2019-10-31 DIAGNOSIS — H35373 Puckering of macula, bilateral: Secondary | ICD-10-CM | POA: Diagnosis not present

## 2019-10-31 DIAGNOSIS — H353221 Exudative age-related macular degeneration, left eye, with active choroidal neovascularization: Secondary | ICD-10-CM | POA: Diagnosis not present

## 2019-10-31 DIAGNOSIS — H353112 Nonexudative age-related macular degeneration, right eye, intermediate dry stage: Secondary | ICD-10-CM | POA: Diagnosis not present

## 2019-10-31 DIAGNOSIS — H43812 Vitreous degeneration, left eye: Secondary | ICD-10-CM | POA: Diagnosis not present

## 2019-11-08 ENCOUNTER — Other Ambulatory Visit: Payer: Self-pay | Admitting: Internal Medicine

## 2019-11-08 DIAGNOSIS — I10 Essential (primary) hypertension: Secondary | ICD-10-CM

## 2019-11-08 DIAGNOSIS — E785 Hyperlipidemia, unspecified: Secondary | ICD-10-CM

## 2019-11-08 MED ORDER — ATORVASTATIN CALCIUM 10 MG PO TABS: 10.0000 mg | ORAL_TABLET | Freq: Every day | ORAL | 1 refills | Status: DC

## 2019-12-08 DIAGNOSIS — N393 Stress incontinence (female) (male): Secondary | ICD-10-CM | POA: Diagnosis not present

## 2019-12-08 DIAGNOSIS — Z8546 Personal history of malignant neoplasm of prostate: Secondary | ICD-10-CM | POA: Diagnosis not present

## 2019-12-08 DIAGNOSIS — N5231 Erectile dysfunction following radical prostatectomy: Secondary | ICD-10-CM | POA: Diagnosis not present

## 2019-12-08 DIAGNOSIS — N3040 Irradiation cystitis without hematuria: Secondary | ICD-10-CM | POA: Diagnosis not present

## 2019-12-14 ENCOUNTER — Other Ambulatory Visit: Payer: Self-pay | Admitting: Internal Medicine

## 2019-12-14 DIAGNOSIS — K7581 Nonalcoholic steatohepatitis (NASH): Secondary | ICD-10-CM

## 2019-12-26 DIAGNOSIS — Z961 Presence of intraocular lens: Secondary | ICD-10-CM | POA: Diagnosis not present

## 2019-12-26 DIAGNOSIS — H35363 Drusen (degenerative) of macula, bilateral: Secondary | ICD-10-CM | POA: Diagnosis not present

## 2019-12-26 DIAGNOSIS — H52222 Regular astigmatism, left eye: Secondary | ICD-10-CM | POA: Diagnosis not present

## 2019-12-26 DIAGNOSIS — H5203 Hypermetropia, bilateral: Secondary | ICD-10-CM | POA: Diagnosis not present

## 2019-12-26 DIAGNOSIS — H353112 Nonexudative age-related macular degeneration, right eye, intermediate dry stage: Secondary | ICD-10-CM | POA: Diagnosis not present

## 2019-12-26 DIAGNOSIS — H2511 Age-related nuclear cataract, right eye: Secondary | ICD-10-CM | POA: Diagnosis not present

## 2019-12-26 DIAGNOSIS — H353221 Exudative age-related macular degeneration, left eye, with active choroidal neovascularization: Secondary | ICD-10-CM | POA: Diagnosis not present

## 2019-12-26 DIAGNOSIS — H524 Presbyopia: Secondary | ICD-10-CM | POA: Diagnosis not present

## 2020-01-02 DIAGNOSIS — H353112 Nonexudative age-related macular degeneration, right eye, intermediate dry stage: Secondary | ICD-10-CM | POA: Diagnosis not present

## 2020-01-02 DIAGNOSIS — H353221 Exudative age-related macular degeneration, left eye, with active choroidal neovascularization: Secondary | ICD-10-CM | POA: Diagnosis not present

## 2020-01-13 ENCOUNTER — Other Ambulatory Visit: Payer: Self-pay | Admitting: Internal Medicine

## 2020-01-13 DIAGNOSIS — I10 Essential (primary) hypertension: Secondary | ICD-10-CM

## 2020-01-13 NOTE — Telephone Encounter (Signed)
Last visit was in office in May 2020. No follow up indicated in OV notes.

## 2020-01-16 DIAGNOSIS — I10 Essential (primary) hypertension: Secondary | ICD-10-CM | POA: Diagnosis not present

## 2020-01-16 DIAGNOSIS — K219 Gastro-esophageal reflux disease without esophagitis: Secondary | ICD-10-CM | POA: Diagnosis not present

## 2020-01-16 DIAGNOSIS — Z791 Long term (current) use of non-steroidal anti-inflammatories (NSAID): Secondary | ICD-10-CM | POA: Diagnosis not present

## 2020-01-16 DIAGNOSIS — Z7984 Long term (current) use of oral hypoglycemic drugs: Secondary | ICD-10-CM | POA: Diagnosis not present

## 2020-01-16 DIAGNOSIS — E119 Type 2 diabetes mellitus without complications: Secondary | ICD-10-CM | POA: Diagnosis not present

## 2020-01-16 DIAGNOSIS — Z20822 Contact with and (suspected) exposure to covid-19: Secondary | ICD-10-CM | POA: Diagnosis not present

## 2020-01-16 DIAGNOSIS — Z8546 Personal history of malignant neoplasm of prostate: Secondary | ICD-10-CM | POA: Diagnosis not present

## 2020-02-27 ENCOUNTER — Ambulatory Visit: Payer: Medicare HMO

## 2020-02-27 DIAGNOSIS — H353221 Exudative age-related macular degeneration, left eye, with active choroidal neovascularization: Secondary | ICD-10-CM | POA: Diagnosis not present

## 2020-02-27 DIAGNOSIS — H353112 Nonexudative age-related macular degeneration, right eye, intermediate dry stage: Secondary | ICD-10-CM | POA: Diagnosis not present

## 2020-03-07 ENCOUNTER — Telehealth: Payer: Self-pay | Admitting: Internal Medicine

## 2020-03-07 NOTE — Progress Notes (Signed)
  Chronic Care Management   Note  03/07/2020 Name: Corey [name] MRN: [id number] DOB: Mar 02, 1951  Corey [name] is a 69 y.o. year old male who is a primary care patient of Janith Lima, MD. I reached out to [name] by phone today in response to a referral sent by Corey [name]'s PCP, Janith Lima, MD.   Corey [name] was given information about Chronic Care Management services today including:  1. CCM service includes personalized support from designated clinical staff supervised by his physician, including individualized plan of care and coordination with other care providers 2. 24/7 contact phone numbers for assistance for urgent and routine care needs. 3. Service will only be billed when office clinical staff spend 20 minutes or more in a month to coordinate care. 4. Only one practitioner may furnish and bill the service in a calendar month. 5. The patient may stop CCM services at any time (effective at the end of the month) by phone call to the office staff.   Patient agreed to services and verbal consent obtained.   Follow up plan:   Raynicia Dukes [employer] Scheduler

## 2020-03-07 NOTE — Progress Notes (Signed)
  Chronic Care Management   Outreach Note  03/07/2020 Name: Corey [name] MRN: [id number] DOB: 30-Sep-1951  Referred by: Janith Lima, MD Reason for referral : No chief complaint on file.   An unsuccessful telephone outreach was attempted today. The patient was referred to the pharmacist for assistance with care management and care coordination.   Follow Up Plan:   Raynicia Dukes [employer] Scheduler

## 2020-03-09 ENCOUNTER — Ambulatory Visit: Payer: Medicare HMO | Admitting: Pharmacist

## 2020-03-09 ENCOUNTER — Other Ambulatory Visit: Payer: Self-pay

## 2020-03-09 DIAGNOSIS — Z8546 Personal history of malignant neoplasm of prostate: Secondary | ICD-10-CM | POA: Diagnosis not present

## 2020-03-09 DIAGNOSIS — R739 Hyperglycemia, unspecified: Secondary | ICD-10-CM

## 2020-03-09 DIAGNOSIS — N5231 Erectile dysfunction following radical prostatectomy: Secondary | ICD-10-CM | POA: Diagnosis not present

## 2020-03-09 DIAGNOSIS — E785 Hyperlipidemia, unspecified: Secondary | ICD-10-CM

## 2020-03-09 DIAGNOSIS — N393 Stress incontinence (female) (male): Secondary | ICD-10-CM | POA: Diagnosis not present

## 2020-03-09 DIAGNOSIS — N320 Bladder-neck obstruction: Secondary | ICD-10-CM | POA: Diagnosis not present

## 2020-03-09 DIAGNOSIS — I10 Essential (primary) hypertension: Secondary | ICD-10-CM

## 2020-03-09 DIAGNOSIS — N3040 Irradiation cystitis without hematuria: Secondary | ICD-10-CM | POA: Diagnosis not present

## 2020-03-09 NOTE — Patient Instructions (Addendum)
Visit Information  Thank you for meeting with me to discuss your medications! I look forward to working with you to achieve your health care goals. Below is a summary of what we talked about during the visit:  Goals Addressed            This Visit's Progress   . Pharmacy Care Plan       CARE PLAN ENTRY  Current Barriers:  . Chronic Disease Management support, education, and care coordination needs related to HTN, HLD, and prediabetes  Pharmacist Clinical Goal(s):  Marland Kitchen Maintain BP < 130/80 . Maintain LDL < 100 . Maintain A1c < 6.5% . Ensure safety, efficacy, and affordability of medications  Interventions: . Comprehensive medication review performed. . Recommended to take medications with food to avoid side effects . Discussed benefits of exercise and diet changes for overall health  Patient Self Care Activities:  . Self administers medications as prescribed, Calls pharmacy for medication refills, and Calls provider office for new concerns or questions  Initial goal documentation        Mr. Scibilia was given information about Chronic Care Management services today including:  1. CCM service includes personalized support from designated clinical staff supervised by his physician, including individualized plan of care and coordination with other care providers 2. 24/7 contact phone numbers for assistance for urgent and routine care needs. 3. Service will only be billed when office clinical staff spend 20 minutes or more in a month to coordinate care. 4. Only one practitioner may furnish and bill the service in a calendar month. 5. The patient may stop CCM services at any time (effective at the end of the month) by phone call to the office staff.  Patient agreed to services and verbal consent obtained.   The patient verbalized understanding of instructions provided today and agreed to receive a mailed copy of patient instruction and/or educational materials. Telephone follow up  appointment with pharmacy team member scheduled for: 09/14/20 @ 1:00 pm   Charlene Brooke, PharmD Clinical Pharmacist [facility] [phone]  Preventing Type 2 Diabetes Mellitus Type 2 diabetes (type 2 diabetes mellitus) is a long-term (chronic) disease that affects blood sugar (glucose) levels. Normally, a hormone called insulin allows glucose to enter cells in the body. The cells use glucose for energy. In type 2 diabetes, one or both of these problems may be present:  The body does not make enough insulin.  The body does not respond properly to insulin that it makes (insulin resistance). Insulin resistance or lack of insulin causes excess glucose to build up in the blood instead of going into cells. As a result, high blood glucose (hyperglycemia) develops, which can cause many complications. Being overweight or obese and having an inactive (sedentary) lifestyle can increase your risk for diabetes. Type 2 diabetes can be delayed or prevented by making certain nutrition and lifestyle changes. What nutrition changes can be made?   Eat healthy meals and snacks regularly. Keep a healthy snack with you for when you get hungry between meals, such as fruit or a handful of nuts.  Eat lean meats and proteins that are low in saturated fats, such as chicken, fish, egg whites, and beans. Avoid processed meats.  Eat plenty of fruits and vegetables and plenty of grains that have not been processed (whole grains). It is recommended that you eat: ? 1?2 cups of fruit every day. ? 2?3 cups of vegetables every day. ? 6?8 oz of whole grains every  day, such as oats, whole wheat, bulgur, brown rice, quinoa, and millet.  Eat low-fat dairy products, such as milk, yogurt, and cheese.  Eat foods that contain healthy fats, such as nuts, avocado, olive oil, and canola oil.  Drink water throughout the day. Avoid drinks that contain added sugar, such as soda or sweet tea.  Follow  instructions from your health care provider about specific eating or drinking restrictions.  Control how much food you eat at a time (portion size). ? Check food labels to find out the serving sizes of foods. ? Use a kitchen scale to weigh amounts of foods.  Saute or steam food instead of frying it. Cook with water or broth instead of oils or butter.  Limit your intake of: ? Salt (sodium). Have no more than 1 tsp (2,400 mg) of sodium a day. If you have heart disease or high blood pressure, have less than ? tsp (1,500 mg) of sodium a day. ? Saturated fat. This is fat that is solid at room temperature, such as butter or fat on meat. What lifestyle changes can be made? Activity   Do moderate-intensity physical activity for at least 30 minutes on at least 5 days of the week, or as much as told by your health care provider.  Ask your health care provider what activities are safe for you. A mix of physical activities may be best, such as walking, swimming, cycling, and strength training.  Try to add physical activity into your day. For example: ? Park in spots that are farther away than usual, so that you walk more. For example, park in a far corner of the parking lot when you go to the office or the grocery store. ? Take a walk during your lunch break. ? Use stairs instead of elevators or escalators. Weight Loss  Lose weight as directed. Your health care provider can determine how much weight loss is best for you and can help you lose weight safely.  If you are overweight or obese, you may be instructed to lose at least 5?7 % of your body weight. Alcohol and Tobacco   Limit alcohol intake to no more than 1 drink a day for nonpregnant women and 2 drinks a day for men. One drink equals 12 oz of beer, 5 oz of wine, or 1 oz of hard liquor.  Do not use any tobacco products, such as cigarettes, chewing tobacco, and e-cigarettes. If you need help quitting, ask your health care provider. Work  With [place] Provider  Have your blood glucose tested regularly, as told by your health care provider.  Discuss your risk factors and how you can reduce your risk for diabetes.  Get screening tests as told by your health care provider. You may have screening tests regularly, especially if you have certain risk factors for type 2 diabetes.  Make an appointment with a diet and nutrition specialist (registered dietitian). A registered dietitian can help you make a healthy eating plan and can help you understand portion sizes and food labels. Why are these changes important?  It is possible to prevent or delay type 2 diabetes and related health problems by making lifestyle and nutrition changes.  It can be difficult to recognize signs of type 2 diabetes. The best way to avoid possible damage to your body is to take actions to prevent the disease before you develop symptoms. What can happen if changes are not made?  Your blood glucose levels may keep increasing. Having  high blood glucose for a long time is dangerous. Too much glucose in your blood can damage your blood vessels, heart, kidneys, nerves, and eyes.  You may develop prediabetes or type 2 diabetes. Type 2 diabetes can lead to many chronic health problems and complications, such as: ? Heart disease. ? Stroke. ? Blindness. ? Kidney disease. ? Depression. ? Poor circulation in the feet and legs, which could lead to surgical removal (amputation) in severe cases. Where to find support  Ask your health care provider to recommend a registered dietitian, diabetes educator, or weight loss program.  Look for local or online weight loss groups.  Join a gym, fitness club, or outdoor activity group, such as a walking club. Where to find more information To learn more about diabetes and diabetes prevention, visit:  American Diabetes Association (ADA): www.diabetes.[employer] of Diabetes and Digestive and Kidney  Diseases: FindSpin.nl To learn more about healthy eating, visit:  The U.S. Department of Agriculture Scientist, research (physical sciences)), Choose My Plate: http://wiley-williams.com/  Office of Disease Prevention and Health Promotion (ODPHP), Dietary Guidelines: SurferLive.at Summary  You can reduce your risk for type 2 diabetes by increasing your physical activity, eating healthy foods, and losing weight as directed.  Talk with your health care provider about your risk for type 2 diabetes. Ask about any blood tests or screening tests that you need to have. This information is not intended to replace advice given to you by your health care provider. Make sure you discuss any questions you have with your health care provider. Document Revised: 04/08/2019 Document Reviewed: 02/05/2016 Elsevier Patient Education  [place].

## 2020-03-09 NOTE — Chronic Care Management (AMB) (Signed)
Chronic Care Management Pharmacy  Name: Truman Aceituno  MRN: [id number] DOB: [birth year]  Chief Complaint/ HPI  Corey [name],  69 y.o. , male presents for their Initial CCM visit with the clinical pharmacist via telephone due to COVID-19 Pandemic.  PCP : Janith Lima, MD  Their chronic conditions include: HTN, HLD, hyperglycemia, hx prostate cancer  Grew up in [facility], job transferred to [employer] in 2011. 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 Daughter in [place].   Office Visits: 05/10/19 Dr Ronnald Ramp OV: BP controlled, no med changes. Elevated AST, ordered liver US to screen for NASH.  06/14/19: pt message - Liver US revealed NASH, started pioglitazone to reduce inflammation.   Consult Visit: 12/08/19 Dr Odis Luster [facility] urology): received AUS surgery, greatly improved incontinence. Now c/o ED, trial of Cialis w/ Goodrx coupon.  Medications: Outpatient Encounter Medications as of 03/09/2020  Medication Sig  . amLODipine (NORVASC) 10 MG tablet TAKE 1 TABLET BY MOUTH EVERY DAY  . atorvastatin (LIPITOR) 10 MG tablet Take 1 tablet (10 mg total) by mouth daily.  . benazepril (LOTENSIN) 20 MG tablet TAKE 1 TABLET BY MOUTH EVERY DAY  . cyanocobalamin 2000 MCG tablet Take 1 tablet (2,000 mcg total) by mouth daily.  . ferrous sulfate 325 (65 FE) MG tablet Take 1 tablet (325 mg total) by mouth daily with breakfast.  . hydrochlorothiazide (HYDRODIURIL) 25 MG tablet Take 1 tablet (25 mg total) by mouth daily.  . Multiple Vitamin (MULTIVITAMIN WITH MINERALS) TABS tablet Take 1 tablet by mouth daily.  . Multiple Vitamins-Iron (MULTIVITAMIN PLUS IRON ADULT) TABS Take 1 tablet by mouth daily.  Marland Kitchen OVER THE COUNTER MEDICATION Take 1 tablet by mouth daily. Silencil - tinnitus treatment  . pioglitazone (ACTOS) 15 MG tablet Take 1 tablet (15 mg total) by mouth daily.   No facility-administered encounter medications on file as of 03/09/2020.     Current Diagnosis/Assessment:  Goals  Addressed            This Visit's Progress   . Pharmacy Care Plan       CARE PLAN ENTRY  Current Barriers:  . Chronic Disease Management support, education, and care coordination needs related to HTN, HLD, and prediabetes  Pharmacist Clinical Goal(s):  Marland Kitchen Maintain BP < 130/80 . Maintain LDL < 100 . Maintain A1c < 6.5% . Ensure safety, efficacy, and affordability of medications  Interventions: . Comprehensive medication review performed. . Recommended to take medications with food to avoid side effects . Discussed benefits of exercise and diet changes for overall health  Patient Self Care Activities:  . Self administers medications as prescribed, Calls pharmacy for medication refills, and Calls provider office for new concerns or questions  Initial goal documentation        Hypergylcemia/NASH   Recent Relevant Labs: Lab Results  Component Value Date/Time   HGBA1C 6.2 05/10/2019 04:16 PM   HGBA1C 5.6 06/29/2017 04:46 PM    Patient has failed these meds in past: n/a Patient is currently controlled on the following medications: pioglitazone 15 mg daily   We discussed: diet and exercise extensively, discussed what A1c is and diabetic goals. Pt denies issues with pioglitazone.  Plan  Continue current medications and control with diet and exercise    Hypertension   Office blood pressures are  BP Readings from Last 3 Encounters:  05/10/19 130/80  07/19/18 138/78  02/10/18 132/88   Lab Results  Component Value Date   CREATININE 1.13 05/10/2019  CREATININE 1.00 02/10/2018   CREATININE 1.15 03/13/2017   Patient has failed these meds in the past: n/a Patient is currently controlled on the following medications: amlodipine 10 mg daily, benazepril 20 mg daily, HCTZ 25 mg daily   Patient checks BP at home: never, does not have cuff  Patient home BP readings are ranging: n/a  We discussed diet and exercise extensively, BP goals and benefits of antihypertensives  drugs. Pt has been on these for ~20 years and denies issues.  Plan  Continue current medications and control with diet and exercise    Hyperlipidemia   Lipid Panel     Component Value Date/Time   CHOL 174 05/10/2019 1616   TRIG 49.0 05/10/2019 1616   HDL 71.70 05/10/2019 1616   CHOLHDL 2 05/10/2019 1616   VLDL 9.8 05/10/2019 1616   LDLCALC 93 05/10/2019 1616    ASCVD 10-year risk: 14.6%  Patient has failed these meds in past: n/a Patient is currently controlled on the following medications: atorvastatin 10 mg daily  We discussed:  diet and exercise extensively, pt wants to lose 30 lbs, plans to start exercise and diet routine with husband using [employer]. Discussed role of cholesterol in heart disease, benefits of statin.  Plan  Continue current medications and control with diet and exercise    Health Maintenance   Patient is currently controlled on the following medications: Vitamin B12 2000 mcg daily, multivitamin with iron, Silencil (tinnitus treatment)  We discussed:  Pt has noticed a marked benefit with tinnitus since starting Silencil, it is taking longer than marketed 30 days so he bought a year supply on sale. Pt is satisfied with OTC regimen  Plan  Continue current medications     Medication Management   Pt uses [facility] for all medications Uses 2-wk pill box   Pt endorses 97% compliance   We discussed:  Benefits of med sync, packaging and delivery with [facility], pt is interested but also very satisfied with [facility] which is only [facility] away. He will think about it and let me know after AWV next month.  Plan  Continue current med management strategy   Follow up: 6 month phone visit  Charlene Brooke, PharmD Clinical Pharmacist [facility] [phone]

## 2020-03-10 NOTE — Progress Notes (Signed)
  I have reviewed this encounter including the documentation in this note and/or discussed this patient with the care management provider. I am certifying that I agree with the content of this note as supervising physician.  Scarlette Calico, MD  03/10/2020

## 2020-04-09 ENCOUNTER — Other Ambulatory Visit: Payer: Self-pay

## 2020-04-09 ENCOUNTER — Ambulatory Visit (INDEPENDENT_AMBULATORY_CARE_PROVIDER_SITE_OTHER): Payer: Medicare HMO

## 2020-04-09 VITALS — BP 122/80 | HR 73 | Temp 98.3°F | Resp 16 | Ht 70.0 in | Wt 203.0 lb

## 2020-04-09 DIAGNOSIS — Z0000 Encounter for general adult medical examination without abnormal findings: Secondary | ICD-10-CM

## 2020-04-09 NOTE — Patient Instructions (Signed)
Corey [name] , Thank you for taking time to come for your Medicare Wellness Visit. I appreciate your ongoing commitment to your health goals. Please review the following plan we discussed and let me know if I can assist you in the future.   Screening recommendations/referrals: Colorectal Screening: last done 03/11/2016  Vision and Dental Exams: Recommended annual ophthalmology exams for early detection of glaucoma and other disorders of the eye Recommended annual dental exams for proper oral hygiene  Vaccinations: Influenza vaccine: 09/27/2019  Pneumococcal vaccine: 06/29/2017 & 06/29/2017  Tdap vaccine: 08/05/2013; Done every 10 years. Please call your insurance company to determine your out of pocket expense. You may also receive this vaccine at your local pharmacy or Health Dept.  Shingles vaccine: Please call your insurance company to determine your out of pocket expense for the Shingrix vaccine. You may receive this vaccine at your local pharmacy.  Advanced directives: Advance directives discussed with you today. I have provided a copy for you to complete at home and have notarized. Once this is complete please bring a copy in to our office so we can scan it into your chart.  Goals:  Recommend to drink at least 6-8 8oz glasses of water per day.  Recommend to exercise for at least 150 minutes per week.  Recommend to remove any items from the home that may cause slips or trips.  Recommend to decrease portion sizes by eating 3 small healthy meals and at least 2 healthy snacks per day.  Recommend to begin DASH diet as directed below  Recommend to continue efforts to reduce smoking habits until no longer smoking. Smoking Cessation literature is attached below.  Next appointment: Please schedule your Annual Wellness Visit with your Nurse Health Advisor in one year.  Preventive Care 32 Years and Older, Male Preventive care refers to lifestyle choices and visits with your health care provider  that can promote health and wellness. What does preventive care include?  A yearly physical exam. This is also called an annual well check.  Dental exams once or twice a year.  Routine eye exams. Ask your health care provider how often you should have your eyes checked.  Personal lifestyle choices, including:  Daily care of your teeth and gums.  Regular physical activity.  Eating a healthy diet.  Avoiding tobacco and drug use.  Limiting alcohol use.  Practicing safe sex.  Taking low doses of aspirin every day if recommended by your health care provider..  Taking vitamin and mineral supplements as recommended by your health care provider. What happens during an annual well check? The services and screenings done by your health care provider during your annual well check will depend on your age, overall health, lifestyle risk factors, and family history of disease. Counseling  Your health care provider may ask you questions about your:  Alcohol use.  Tobacco use.  Drug use.  Emotional well-being.  Home and relationship well-being.  Sexual activity.  Eating habits.  History of falls.  Memory and ability to understand (cognition).  Work and work Statistician. Screening  You may have the following tests or measurements:  Height, weight, and BMI.  Blood pressure.  Lipid and cholesterol levels. These may be checked every 5 years, or more frequently if you are over 47 years old.  Skin check.  Lung cancer screening. You may have this screening every year starting at age 61 if you have a 30-pack-year history of smoking and currently smoke or have quit within the past 15  years.  Fecal occult blood test (FOBT) of the stool. You may have this test every year starting at age 56.  Flexible sigmoidoscopy or colonoscopy. You may have a sigmoidoscopy every 5 years or a colonoscopy every 10 years starting at age 74.  Prostate cancer screening. Recommendations will vary  depending on your family history and other risks.  Hepatitis C blood test.  Hepatitis B blood test.  Sexually transmitted disease (STD) testing.  Diabetes screening. This is done by checking your blood sugar (glucose) after you have not eaten for a while (fasting). You may have this done every 1-3 years.  Abdominal aortic aneurysm (AAA) screening. You may need this if you are a current or former smoker.  Osteoporosis. You may be screened starting at age 15 if you are at high risk. Talk with your health care provider about your test results, treatment options, and if necessary, the need for more tests. Vaccines  Your health care provider may recommend certain vaccines, such as:  Influenza vaccine. This is recommended every year.  Tetanus, diphtheria, and acellular pertussis (Tdap, Td) vaccine. You may need a Td booster every 10 years.  Zoster vaccine. You may need this after age 23.  Pneumococcal 13-valent conjugate (PCV13) vaccine. One dose is recommended after age 38.  Pneumococcal polysaccharide (PPSV23) vaccine. One dose is recommended after age 49. Talk to your health care provider about which screenings and vaccines you need and how often you need them. This information is not intended to replace advice given to you by your health care provider. Make sure you discuss any questions you have with your health care provider. Document Released: 01/11/2016 Document Revised: 09/03/2016 Document Reviewed: 10/16/2015 Elsevier Interactive Patient Education  2017 [place] Prevention in the Home Falls can cause injuries. They can happen to people of all ages. There are many things you can do to make your home safe and to help prevent falls. What can I do on the outside of my home?  Regularly fix the edges of walkways and driveways and fix any cracks.  Remove anything that might make you trip as you walk through a door, such as a raised step or threshold.  Trim any bushes  or trees on the path to your home.  Use bright outdoor lighting.  Clear any walking paths of anything that might make someone trip, such as rocks or tools.  Regularly check to see if handrails are loose or broken. Make sure that both sides of any steps have handrails.  Any raised decks and porches should have guardrails on the edges.  Have any leaves, snow, or ice cleared regularly.  Use sand or salt on walking paths during winter.  Clean up any spills in your garage right away. This includes oil or grease spills. What can I do in the bathroom?  Use night lights.  Install grab bars by the toilet and in the tub and shower. Do not use towel bars as grab bars.  Use non-skid mats or decals in the tub or shower.  If you need to sit down in the shower, use a plastic, non-slip stool.  Keep the floor dry. Clean up any water that spills on the floor as soon as it happens.  Remove soap buildup in the tub or shower regularly.  Attach bath mats securely with double-sided non-slip rug tape.  Do not have throw rugs and other things on the floor that can make you trip. What can I do in the bedroom?  Use night lights.  Make sure that you have a light by your bed that is easy to reach.  Do not use any sheets or blankets that are too big for your bed. They should not hang down onto the floor.  Have a firm chair that has side arms. You can use this for support while you get dressed.  Do not have throw rugs and other things on the floor that can make you trip. What can I do in the kitchen?  Clean up any spills right away.  Avoid walking on wet floors.  Keep items that you use a lot in easy-to-reach places.  If you need to reach something above you, use a strong step stool that has a grab bar.  Keep electrical cords out of the way.  Do not use floor polish or wax that makes floors slippery. If you must use wax, use non-skid floor wax.  Do not have throw rugs and other things on  the floor that can make you trip. What can I do with my stairs?  Do not leave any items on the stairs.  Make sure that there are handrails on both sides of the stairs and use them. Fix handrails that are broken or loose. Make sure that handrails are as long as the stairways.  Check any carpeting to make sure that it is firmly attached to the stairs. Fix any carpet that is loose or worn.  Avoid having throw rugs at the top or bottom of the stairs. If you do have throw rugs, attach them to the floor with carpet tape.  Make sure that you have a light switch at the top of the stairs and the bottom of the stairs. If you do not have them, ask someone to add them for you. What else can I do to help prevent falls?  Wear shoes that:  Do not have high heels.  Have rubber bottoms.  Are comfortable and fit you well.  Are closed at the toe. Do not wear sandals.  If you use a stepladder:  Make sure that it is fully opened. Do not climb a closed stepladder.  Make sure that both sides of the stepladder are locked into place.  Ask someone to hold it for you, if possible.  Clearly mark and make sure that you can see:  Any grab bars or handrails.  First and last steps.  Where the edge of each step is.  Use tools that help you move around (mobility aids) if they are needed. These include:  Canes.  Walkers.  Scooters.  Crutches.  Turn on the lights when you go into a dark area. Replace any light bulbs as soon as they burn out.  Set up your furniture so you have a clear path. Avoid moving your furniture around.  If any of your floors are uneven, fix them.  If there are any pets around you, be aware of where they are.  Review your medicines with your doctor. Some medicines can make you feel dizzy. This can increase your chance of falling. Ask your doctor what other things that you can do to help prevent falls. This information is not intended to replace advice given to you by  your health care provider. Make sure you discuss any questions you have with your health care provider. Document Released: 10/11/2009 Document Revised: 05/22/2016 Document Reviewed: 01/19/2015 Elsevier Interactive Patient Education  2017 [employer].

## 2020-04-09 NOTE — Progress Notes (Addendum)
Subjective:   Corey [name] is a 69 y.o. male who presents for Medicare Annual/Subsequent preventive examination.  Review of Systems:  Medicare Wellness visit. Cardiac Risk Factors include: advanced age (>60mn, >>70women);dyslipidemia;male gender Sleep Patterns: No issues with falling sleep; feels rested on waking. Home Safety/Smoke Alarms: Feels safe in home; Smoke alarms in place. Living environment: Lives in a 1-story home.  No need for DME; has a good support system. Seat Belt Safety/Bike Helmet: Wears seat belt.    Objective:    Vitals: BP 122/80 (BP Location: Left Arm, Patient Position: Sitting, Cuff Size: Normal)   Pulse 73   Temp 98.3 F (36.8 C)   Resp 16   Ht 5' 10"  (1.778 m)   Wt 203 lb (92.1 kg)   SpO2 97%   BMI 29.13 kg/m   Body mass index is 29.13 kg/m.  Advanced Directives 04/09/2020 07/19/2018 07/02/2017 03/12/2017 03/10/2017  Does Patient Have a Medical Advance Directive? No No No No No  Would patient like information on creating a medical advance directive? Yes (ED - Information included in AVS) Yes (ED - Information included in AVS) Yes (Inpatient - patient defers creating a medical advance directive at this time) No - Patient declined Yes (MAU/Ambulatory/Procedural Areas - Information given)    Tobacco Social History   Tobacco Use  Smoking Status Never Smoker  Smokeless Tobacco Never Used     Counseling given: No   Clinical Intake:  Pre-visit preparation completed: Yes  Pain : No/denies pain Pain Score: 0-No pain     BMI - recorded: 29 Nutritional Status: BMI 25 -29 Overweight Nutritional Risks: None Diabetes: No  How often do you need to have someone help you when you read instructions, pamphlets, or other written materials from your doctor or pharmacy?: 1 - Never What is the last grade level you completed in school?: 12th grade  Interpreter Needed?: No  Information entered by :: Carreen Milius N. HLowell Guitar LPN  Past Medical History:    Diagnosis Date  . Arthritis    "minor; elbows, wrists" (03/12/2017)  . BPH (benign prostatic hyperplasia) 2011  . Hypertension   . Incisional hernia   . [place]   S/P prostatectomy, TURP, radiation in 2017   Past Surgical History:  Procedure Laterality Date  . HERNIA REPAIR    . INCISIONAL HERNIA REPAIR N/A 03/12/2017   Procedure: LAPAROSCOPIC INCISIONAL HERNIA REPAIR;  Surgeon: TErroll Luna MD;  [facility]  Service: General;  Laterality: N/A;  . INGUINAL HERNIA REPAIR Right ~ 2007  . INSERTION OF MESH N/A 03/12/2017   Procedure: INSERTION OF MESH;  Surgeon: TErroll Luna MD;  [facility]  Service: General;  Laterality: N/A;  . LAPAROSCOPIC INCISIONAL / UMBILICAL / [place] 03/12/2017   IHR w/mesh  . PROSTATECTOMY  2017  . TRANSURETHRAL RESECTION OF PROSTATE  2017   "after they removed my prostate"  . UMBILICAL HERNIA REPAIR  ~ 2012   Family History  Problem Relation Age of Onset  . Cancer Mother   . Alcohol abuse Sister   . Early death Sister   . Stroke Neg Hx   . Kidney disease Neg Hx   . Hypertension Neg Hx   . Heart disease Neg Hx   . Hyperlipidemia Neg Hx   . Diabetes Neg Hx   . Depression Neg Hx   . COPD Neg Hx   . Asthma Neg Hx    Social History   Socioeconomic History  .  Marital status: Married    Spouse name: Not on file  . Number of children: 3  . Years of education: Not on file  . Highest education level: 12th grade  Occupational History  . Not on file  Tobacco Use  . Smoking status: Never Smoker  . Smokeless tobacco: Never Used  Substance and Sexual Activity  . Alcohol use: Yes    Alcohol/week: 7.0 standard drinks    Types: 7 Glasses of wine per week  . Drug use: No  . Sexual activity: Yes    Birth control/protection: None  Other Topics Concern  . Not on file  Social History Narrative  . Not on file   Social Determinants of Health   Financial Resource Strain:   . Difficulty of Paying Living  Expenses:   Food Insecurity:   . Worried About Charity fundraiser in the Last Year:   . Arboriculturist in the Last Year:   Transportation Needs:   . Film/video editor (Medical):   Marland Kitchen Lack of Transportation (Non-Medical):   Physical Activity:   . Days of Exercise per Week:   . Minutes of Exercise per Session:   Stress:   . Feeling of Stress :   Social Connections:   . Frequency of Communication with Friends and Family:   . Frequency of Social Gatherings with Friends and Family:   . Attends Religious Services:   . Active Member of Clubs or Organizations:   . Attends Archivist Meetings:   Marland Kitchen Marital Status:     Outpatient Encounter Medications as of 04/09/2020  Medication Sig  . amLODipine (NORVASC) 10 MG tablet TAKE 1 TABLET BY MOUTH EVERY DAY  . atorvastatin (LIPITOR) 10 MG tablet Take 1 tablet (10 mg total) by mouth daily.  . benazepril (LOTENSIN) 20 MG tablet TAKE 1 TABLET BY MOUTH EVERY DAY  . cyanocobalamin 2000 MCG tablet Take 1 tablet (2,000 mcg total) by mouth daily.  . ferrous sulfate 325 (65 FE) MG tablet Take 1 tablet (325 mg total) by mouth daily with breakfast.  . hydrochlorothiazide (HYDRODIURIL) 25 MG tablet Take 1 tablet (25 mg total) by mouth daily.  . Multiple Vitamin (MULTIVITAMIN WITH MINERALS) TABS tablet Take 1 tablet by mouth daily.  . Multiple Vitamins-Iron (MULTIVITAMIN PLUS IRON ADULT) TABS Take 1 tablet by mouth daily.  Marland Kitchen OVER THE COUNTER MEDICATION Take 1 tablet by mouth daily. Silencil - tinnitus treatment  . pioglitazone (ACTOS) 15 MG tablet Take 1 tablet (15 mg total) by mouth daily.   No facility-administered encounter medications on file as of 04/09/2020.    Activities of Daily Living In your present state of health, do you have any difficulty performing the following activities: 04/09/2020  Hearing? Y  Comment tinnitis  Difficulty concentrating or making decisions? N  Walking or climbing stairs? N  Dressing or bathing? N  Doing  errands, shopping? N  Preparing Food and eating ? N  Using the Toilet? N  In the past six months, have you accidently leaked urine? N  Do you have problems with loss of bowel control? N  Managing your Medications? N  Managing your Finances? N  Housekeeping or managing your Housekeeping? N  Some recent data might be hidden    Patient Care Team: Janith Lima, MD as PCP - General (Internal Medicine) Charlton Haws, [facility] as Pharmacist (Pharmacist)   Assessment:   This is a routine wellness examination for Taliesin.  Exercise Activities and Dietary recommendations  Current Exercise Habits: The patient has a physically strenuous job, but has no regular exercise apart from work.  Goals    . Client understands the importance of follow-up with providers by attending scheduled visits    . Patient Stated     Lose weight by eating healthy. I will increase the amount of salad and cut back on fried foods.     . Patient Stated     Continue to be active and maintain a healthy life.    . Pharmacy Care Plan     CARE PLAN ENTRY  Current Barriers:  . Chronic Disease Management support, education, and care coordination needs related to HTN, HLD, and prediabetes  Pharmacist Clinical Goal(s):  Marland Kitchen Maintain BP < 130/80 . Maintain LDL < 100 . Maintain A1c < 6.5% . Ensure safety, efficacy, and affordability of medications  Interventions: . Comprehensive medication review performed. . Recommended to take medications with food to avoid side effects . Discussed benefits of exercise and diet changes for overall health  Patient Self Care Activities:  . Self administers medications as prescribed, Calls pharmacy for medication refills, and Calls provider office for new concerns or questions  Initial goal documentation       Fall Risk Fall Risk  04/09/2020 07/19/2018 07/02/2017  Falls in the past year? 0 No No  Number falls in past yr: 0 - -  Injury with Fall? 0 - -  Risk for fall due to : No  Fall Risks - -  Follow up Falls evaluation completed;Education provided;Falls prevention discussed - -   Is the patient's home free of loose throw rugs in walkways, pet beds, electrical cords, etc?   yes      Grab bars in the bathroom? no      Handrails on the stairs?   yes      Adequate lighting?   yes  Depression Screen PHQ 2/9 Scores 04/09/2020 07/19/2018 07/02/2017  PHQ - 2 Score 0 0 0  PHQ- 9 Score - 0 -    Cognitive Function     6CIT Screen 04/09/2020  What Year? 0 points  What month? 0 points  What time? 0 points  Count back from 20 0 points  Months in reverse 0 points  Repeat phrase 0 points  Total Score 0    Immunization History  Administered Date(s) Administered  . Influenza, High Dose Seasonal PF 02/10/2018  . Influenza-Unspecified 09/27/2019  . Pneumococcal Conjugate-13 06/29/2017  . Pneumococcal Polysaccharide-23 08/05/2013, 05/10/2019  . Tdap 08/05/2013    Screening Tests Health Maintenance  Topic Date Due  . INFLUENZA VACCINE  07/29/2020  . TETANUS/TDAP  08/06/2023  . COLONOSCOPY  03/11/2026  . Hepatitis C Screening  Completed  . PNA vac Low Risk Adult  Completed   Cancer Screenings: Lung: Low Dose CT Chest recommended if Age 46-80 years, 30 pack-year currently smoking OR have quit w/in 15years. Patient does not qualify. Colorectal: last done in [place], [place] on 03/11/2016; due March 2027     Plan:     Reviewed health maintenance screenings with patient today and relevant education, vaccines, and/or referrals were provided.    Continue doing brain stimulating activities (puzzles, reading, adult coloring books, staying active) to keep memory sharp.    Continue to eat heart healthy diet (full of fruits, vegetables, whole grains, lean protein, water--limit salt, fat, and sugar intake) and increase physical activity as tolerated.  I have personally reviewed and noted the following in the patient's chart:   .  Medical and social history . Use of  alcohol, tobacco or illicit drugs  . Current medications and supplements . Functional ability and status . Nutritional status . Physical activity . Advanced directives . List of other physicians . Hospitalizations, surgeries, and ER visits in previous 12 months . Vitals . Screenings to include cognitive, depression, and falls . Referrals and appointments  In addition, I have reviewed and discussed with patient certain preventive protocols, quality metrics, and best practice recommendations. A written personalized care plan for preventive services as well as general preventive health recommendations were provided to patient.     Sheral Flow, LPN  [id number]  Nurse Health Advisor, Embedded Care Coordination [place] [facility]  Site: [facility] [place]: [phone] Website: [email or web address]   Medical screening examination/treatment/procedure(s) were performed by non-physician practitioner and as supervising physician I was immediately available for consultation/collaboration. I agree with above. Scarlette Calico, MD

## 2020-04-23 DIAGNOSIS — H31111 Age-related choroidal atrophy, right eye: Secondary | ICD-10-CM | POA: Diagnosis not present

## 2020-04-23 DIAGNOSIS — H353221 Exudative age-related macular degeneration, left eye, with active choroidal neovascularization: Secondary | ICD-10-CM | POA: Diagnosis not present

## 2020-04-23 DIAGNOSIS — H353113 Nonexudative age-related macular degeneration, right eye, advanced atrophic without subfoveal involvement: Secondary | ICD-10-CM | POA: Diagnosis not present

## 2020-04-23 DIAGNOSIS — H43812 Vitreous degeneration, left eye: Secondary | ICD-10-CM | POA: Diagnosis not present

## 2020-05-20 ENCOUNTER — Other Ambulatory Visit: Payer: Self-pay | Admitting: Internal Medicine

## 2020-05-20 DIAGNOSIS — E785 Hyperlipidemia, unspecified: Secondary | ICD-10-CM

## 2020-05-20 DIAGNOSIS — I10 Essential (primary) hypertension: Secondary | ICD-10-CM

## 2020-07-09 DIAGNOSIS — H353221 Exudative age-related macular degeneration, left eye, with active choroidal neovascularization: Secondary | ICD-10-CM | POA: Diagnosis not present

## 2020-09-06 DIAGNOSIS — H353113 Nonexudative age-related macular degeneration, right eye, advanced atrophic without subfoveal involvement: Secondary | ICD-10-CM | POA: Diagnosis not present

## 2020-09-06 DIAGNOSIS — H43812 Vitreous degeneration, left eye: Secondary | ICD-10-CM | POA: Diagnosis not present

## 2020-09-06 DIAGNOSIS — H353221 Exudative age-related macular degeneration, left eye, with active choroidal neovascularization: Secondary | ICD-10-CM | POA: Diagnosis not present

## 2020-09-06 LAB — HM DIABETES EYE EXAM

## 2020-09-13 DIAGNOSIS — N393 Stress incontinence (female) (male): Secondary | ICD-10-CM | POA: Diagnosis not present

## 2020-09-13 DIAGNOSIS — E139 Other specified diabetes mellitus without complications: Secondary | ICD-10-CM | POA: Diagnosis not present

## 2020-09-13 DIAGNOSIS — N3040 Irradiation cystitis without hematuria: Secondary | ICD-10-CM | POA: Diagnosis not present

## 2020-09-13 DIAGNOSIS — N320 Bladder-neck obstruction: Secondary | ICD-10-CM | POA: Diagnosis not present

## 2020-09-13 DIAGNOSIS — Z8546 Personal history of malignant neoplasm of prostate: Secondary | ICD-10-CM | POA: Diagnosis not present

## 2020-09-13 DIAGNOSIS — N5231 Erectile dysfunction following radical prostatectomy: Secondary | ICD-10-CM | POA: Diagnosis not present

## 2020-09-13 DIAGNOSIS — N9972 Accidental puncture and laceration of a genitourinary system organ or structure during other procedure: Secondary | ICD-10-CM | POA: Diagnosis not present

## 2020-09-14 ENCOUNTER — Telehealth: Payer: Medicare HMO

## 2020-09-14 ENCOUNTER — Other Ambulatory Visit: Payer: Self-pay

## 2020-09-14 ENCOUNTER — Ambulatory Visit: Payer: Medicare HMO | Admitting: Pharmacist

## 2020-09-14 DIAGNOSIS — E785 Hyperlipidemia, unspecified: Secondary | ICD-10-CM

## 2020-09-14 DIAGNOSIS — I10 Essential (primary) hypertension: Secondary | ICD-10-CM

## 2020-09-14 DIAGNOSIS — R739 Hyperglycemia, unspecified: Secondary | ICD-10-CM

## 2020-09-14 NOTE — Chronic Care Management (AMB) (Signed)
Chronic Care Management Pharmacy  Name: Corey [name]  MRN: [id number] DOB: April 14, 1951  Chief Complaint/ HPI  Corey [name],  69 y.o. , male presents for their Follow-Up CCM visit with the clinical pharmacist via telephone due to COVID-19 Pandemic.  PCP : Janith Lima, MD  Their chronic conditions include: HTN, HLD, hyperglycemia, hx prostate cancer  Patient grew up in [place], job transferred to [facility] [employer] in 2011. 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 He has a daughter in [place].   Office Visits: 05/10/19 Dr Ronnald Ramp OV: BP controlled, no med changes. Elevated AST, ordered liver US to screen for NASH.  06/14/19: pt message - Liver US revealed NASH, started pioglitazone to reduce inflammation.   Consult Visit: 09/13/20 Dr Odis Luster [facility] urology): f/u for ED s/p prostatectomy. Pt would like to pursue surgery.  12/08/19 Dr Odis Luster [facility] urology): received AUS surgery, greatly improved incontinence. Now c/o ED, trial of Cialis w/ Goodrx coupon.  No Known Allergies  Medications: Outpatient Encounter Medications as of 09/14/2020  Medication Sig  . amLODipine (NORVASC) 10 MG tablet TAKE 1 TABLET BY MOUTH EVERY DAY  . atorvastatin (LIPITOR) 10 MG tablet Take 1 tablet (10 mg total) by mouth daily.  . benazepril (LOTENSIN) 20 MG tablet TAKE 1 TABLET BY MOUTH EVERY DAY  . cyanocobalamin 2000 MCG tablet Take 1 tablet (2,000 mcg total) by mouth daily.  . ferrous sulfate 325 (65 FE) MG tablet Take 1 tablet (325 mg total) by mouth daily with breakfast.  . hydrochlorothiazide (HYDRODIURIL) 25 MG tablet Take 1 tablet (25 mg total) by mouth daily.  . Multiple Vitamin (MULTIVITAMIN WITH MINERALS) TABS tablet Take 1 tablet by mouth daily.  . Multiple Vitamins-Iron (MULTIVITAMIN PLUS IRON ADULT) TABS Take 1 tablet by mouth daily.  Marland Kitchen OVER THE COUNTER MEDICATION Take 1 tablet by mouth daily. Silencil - tinnitus treatment  . pioglitazone (ACTOS) 15 MG tablet Take 1 tablet (15 mg  total) by mouth daily.   No facility-administered encounter medications on file as of 09/14/2020.   Wt Readings from Last 3 Encounters:  04/09/20 203 lb (92.1 kg)  05/10/19 207 lb (93.9 kg)  07/19/18 200 lb (90.7 kg)    Current Diagnosis/Assessment:  SDOH Interventions     Most Recent Value  SDOH Interventions  Financial Strain Interventions Intervention Not Indicated     Goals Addressed            This Visit's Progress   . Pharmacy Care Plan       CARE PLAN ENTRY (see longitudinal plan of care for additional care plan information)  Current Barriers:  . Chronic Disease Management support, education, and care coordination needs related to Hypertension, Hyperlipidemia, and Prediabetes   Hypertension BP Readings from Last 3 Encounters:  04/09/20 122/80  05/10/19 130/80  07/19/18 138/78 .  Pharmacist Clinical Goal(s): o Over the next 180 days, patient will work with PharmD and providers to maintain BP goal <130/80 . Current regimen:  o amlodipine 10 mg daily o benazepril 20 mg daily o HCTZ 25 mg daily  . Interventions: o Discussed BP goals and benefits of medications for prevention of heart attack / stroke . Patient self care activities - Over the next 180 days, patient will: o Check BP as needed, document, and provide at future appointments o Ensure daily salt intake < 2300 mg/day  Hyperlipidemia Lab Results  Component Value Date/Time   LDLCALC 93 05/10/2019 04:16 PM .  Pharmacist Clinical Goal(s): o  Over the next 180 days, patient will work with PharmD and providers to maintain LDL goal < 100 . Current regimen:  o atorvastatin 10 mg daily . Interventions: o Discussed cholesterol goals and benefits of medications for prevention of heart attack / stroke . Patient self care activities - Over the next 180 days, patient will: o Continue medications as prescribed  Preiabetes / NASH Lab Results  Component Value Date/Time   HGBA1C 6.2 05/10/2019 04:16 PM   HGBA1C  5.6 06/29/2017 04:46 PM .  Pharmacist Clinical Goal(s): o Over the next 180 days, patient will work with PharmD and providers to maintain A1c goal <6.5% . Current regimen:  o Pioglitazone 15 mg daily . Interventions: o Discussed benefits of pioglitazone for NASH and prediabetes . Patient self care activities - Over the next 180 days, patient will: o Continue medications as prescribed  Medication management . Pharmacist Clinical Goal(s): o Over the next 180 days, patient will work with PharmD and providers to maintain optimal medication adherence . [facility] . Interventions o Comprehensive medication review performed. o Continue current medication management strategy . Patient self care activities - Over the next 180 days, patient will: o Focus on medication adherence by pill box o Take medications as prescribed o Report any questions or concerns to PharmD and/or provider(s)  Please see past updates related to this goal by clicking on the "Past Updates" button in the selected goal         Hypergylcemia/NASH   Recent Relevant Labs: Lab Results  Component Value Date/Time   HGBA1C 6.2 05/10/2019 04:16 PM   HGBA1C 5.6 06/29/2017 04:46 PM   Hepatic Function Latest Ref Rng & Units 05/10/2019 03/13/2017 02/23/2017  Total Protein 6.0 - 8.3 g/dL 7.3 6.0(L) 7.5  Albumin 3.5 - 5.2 g/dL 4.4 2.8(L) 4.1  AST 0 - 37 U/L 63(H) 48(H) 73(H)  ALT 0 - 53 U/L 26 19 30   Alk Phosphatase 39 - 117 U/L 80 72 82  Total Bilirubin 0.2 - 1.2 mg/dL 0.3 0.4 0.7   Patient has failed these meds in past: n/a Patient is currently controlled on the following medications:   pioglitazone 15 mg daily   We discussed: diet and exercise extensively, discussed what A1c is and diabetic goals as well as benefits for NASH. Pt denies issues with pioglitazone.  Plan  Continue current medications and control with diet and exercise    Hypertension   BP goal < 130/80  Office blood pressures are  BP  Readings from Last 3 Encounters:  04/09/20 122/80  05/10/19 130/80  07/19/18 138/78   Kidney Function Lab Results  Component Value Date/Time   CREATININE 1.13 05/10/2019 04:16 PM   CREATININE 1.00 02/10/2018 04:16 PM   GFR 64.58 05/10/2019 04:16 PM   GFRNONAA >60 03/13/2017 06:25 AM   GFRAA >60 03/13/2017 06:25 AM   K 3.8 05/10/2019 04:16 PM   K 3.5 02/10/2018 04:16 PM   Patient has failed these meds in the past: n/a Patient is currently controlled on the following medications:   amlodipine 10 mg daily  benazepril 20 mg daily  HCTZ 25 mg daily   Patient checks BP at home: never, does not have cuff  Patient home BP readings are ranging: n/a  We discussed diet and exercise extensively, BP goals and benefits of antihypertensives drugs. Pt has been on these for ~20 years and denies issues.  Plan  Continue current medications and control with diet and exercise    Hyperlipidemia  LDL goal < 100  Lipid Panel     Component Value Date/Time   CHOL 174 05/10/2019 1616   TRIG 49.0 05/10/2019 1616   HDL 71.70 05/10/2019 1616   CHOLHDL 2 05/10/2019 1616   VLDL 9.8 05/10/2019 1616   LDLCALC 93 05/10/2019 1616    The 10-year ASCVD risk score Mikey Bussing DC Jr., et al., 2013) is: 25%   Values used to calculate the score:     Age: 84 years     Sex: Male     Is Non-Hispanic African American: No     Diabetic: Yes     Tobacco smoker: No     Systolic Blood Pressure: 676 mmHg     Is BP treated: Yes     HDL Cholesterol: 71.7 mg/dL     Total Cholesterol: 174 mg/dL  Patient has failed these meds in past: n/a Patient is currently controlled on the following medications:   atorvastatin 10 mg daily  We discussed:  diet and exercise extensively, pt wants to lose 30 lbs, plans to start exercise and diet routine with husband using [employer]. Discussed role of cholesterol in heart disease, benefits of statin.  Plan  Continue current medications and control with diet and exercise    Vaccines   Reviewed and discussed patient's vaccination history.    Immunization History  Administered Date(s) Administered  . Influenza, High Dose Seasonal PF 02/10/2018  . Influenza-Unspecified 09/27/2019  . Pneumococcal Conjugate-13 06/29/2017  . Pneumococcal Polysaccharide-23 08/05/2013, 05/10/2019  . Tdap 08/05/2013   We discussed: COVID booster vaccine; currently it is recommended to wait for more information regarding the necessity of the booster  Plan  Recommended patient receive influenza vaccine  Health Maintenance   Lab Results  Component Value Date/Time   VITAMINB12 418 01/21/2018 04:02 PM   VITAMINB12 235 06/29/2017 05:04 PM   Patient is currently controlled on the following medications:   Vitamin B12 2000 mcg daily,   multivitamin with iron,   Silencil (tinnitus treatment)  We discussed:  Pt has noticed a marked benefit with tinnitus since starting Silencil, it is taking longer than marketed 30 days so he bought a year supply on sale. Pt is satisfied with OTC regimen  Plan  Continue current medications     Medication Management   [facility] Uses 2-wk pill box   Pt endorses 97% compliance   We discussed:  Pt is satisfied with pharmacy services.  Plan  Continue current med management strategy   Follow up: 6 month phone visit  Charlene Brooke, PharmD, [facility] Clinical Pharmacist [facility] [phone]

## 2020-09-14 NOTE — Patient Instructions (Addendum)
Visit Information  Phone number for Pharmacist: [phone]  Goals Addressed            This Visit's Progress   . Pharmacy Care Plan       CARE PLAN ENTRY (see longitudinal plan of care for additional care plan information)  Current Barriers:  . Chronic Disease Management support, education, and care coordination needs related to Hypertension, Hyperlipidemia, and Prediabetes   Hypertension BP Readings from Last 3 Encounters:  04/09/20 122/80  05/10/19 130/80  07/19/18 138/78 .  Pharmacist Clinical Goal(s): o Over the next 180 days, patient will work with PharmD and providers to maintain BP goal <130/80 . Current regimen:  o amlodipine 10 mg daily o benazepril 20 mg daily o HCTZ 25 mg daily  . Interventions: o Discussed BP goals and benefits of medications for prevention of heart attack / stroke . Patient self care activities - Over the next 180 days, patient will: o Check BP as needed, document, and provide at future appointments o Ensure daily salt intake < 2300 mg/day  Hyperlipidemia Lab Results  Component Value Date/Time   LDLCALC 93 05/10/2019 04:16 PM .  Pharmacist Clinical Goal(s): o Over the next 180 days, patient will work with PharmD and providers to maintain LDL goal < 100 . Current regimen:  o atorvastatin 10 mg daily . Interventions: o Discussed cholesterol goals and benefits of medications for prevention of heart attack / stroke . Patient self care activities - Over the next 180 days, patient will: o Continue medications as prescribed  Preiabetes / NASH Lab Results  Component Value Date/Time   HGBA1C 6.2 05/10/2019 04:16 PM   HGBA1C 5.6 06/29/2017 04:46 PM .  Pharmacist Clinical Goal(s): o Over the next 180 days, patient will work with PharmD and providers to maintain A1c goal <6.5% . Current regimen:  o Pioglitazone 15 mg daily . Interventions: o Discussed benefits of pioglitazone for NASH and prediabetes . Patient self care activities - Over  the next 180 days, patient will: o Continue medications as prescribed  Medication management . Pharmacist Clinical Goal(s): o Over the next 180 days, patient will work with PharmD and providers to maintain optimal medication adherence . [facility] . Interventions o Comprehensive medication review performed. o Continue current medication management strategy . Patient self care activities - Over the next 180 days, patient will: o Focus on medication adherence by pill box o Take medications as prescribed o Report any questions or concerns to PharmD and/or provider(s)  Please see past updates related to this goal by clicking on the "Past Updates" button in the selected goal       Patient verbalizes understanding of instructions provided today.  Telephone follow up appointment with pharmacy team member scheduled for: 6 months  Charlene Brooke, PharmD, BCACP Clinical Pharmacist [facility] [place] [facility] After Age 69 After age 69, you are at a higher risk for certain long-term diseases and infections as well as injuries from falls. Falls are a major cause of broken bones and head injuries in people who are older than age 69. Getting regular preventive care can help to keep you healthy and well. Preventive care includes getting regular testing and making lifestyle changes as recommended by your health care provider. Talk with your health care provider about:  Which screenings and tests you should have. A screening is a test that checks for a disease when you have no symptoms.  A diet and exercise plan that is  right for you. What should I know about screenings and tests to prevent falls? Screening and testing are the best ways to find a health problem early. Early diagnosis and treatment give you the best chance of managing medical conditions that are common after age 69. Certain conditions and lifestyle choices may make you more  likely to have a fall. Your health care provider may recommend:  Regular vision checks. Poor vision and conditions such as cataracts can make you more likely to have a fall. If you wear glasses, make sure to get your prescription updated if your vision changes.  Medicine review. Work with your health care provider to regularly review all of the medicines you are taking, including over-the-counter medicines. Ask your health care provider about any side effects that may make you more likely to have a fall. Tell your health care provider if any medicines that you take make you feel dizzy or sleepy.  Osteoporosis screening. Osteoporosis is a condition that causes the bones to get weaker. This can make the bones weak and cause them to break more easily.  Blood pressure screening. Blood pressure changes and medicines to control blood pressure can make you feel dizzy.  Strength and balance checks. Your health care provider may recommend certain tests to check your strength and balance while standing, walking, or changing positions.  Foot health exam. Foot pain and numbness, as well as not wearing proper footwear, can make you more likely to have a fall.  Depression screening. You may be more likely to have a fall if you have a fear of falling, feel emotionally low, or feel unable to do activities that you used to do.  Alcohol use screening. Using too much alcohol can affect your balance and may make you more likely to have a fall. What actions can I take to lower my risk of falls? General instructions  Talk with your health care provider about your risks for falling. Tell your health care provider if: ? You fall. Be sure to tell your health care provider about all falls, even ones that seem minor. ? You feel dizzy, sleepy, or off-balance.  Take over-the-counter and prescription medicines only as told by your health care provider. These include any supplements.  Eat a healthy diet and maintain a  healthy weight. A healthy diet includes low-fat dairy products, low-fat (lean) meats, and fiber from whole grains, beans, and lots of fruits and vegetables. Home safety  Remove any tripping hazards, such as rugs, cords, and clutter.  Install safety equipment such as grab bars in bathrooms and safety rails on stairs.  Keep rooms and walkways well-lit. Activity   Follow a regular exercise program to stay fit. This will help you maintain your balance. Ask your health care provider what types of exercise are appropriate for you.  If you need a cane or walker, use it as recommended by your health care provider.  Wear supportive shoes that have nonskid soles. Lifestyle  Do not drink alcohol if your health care provider tells you not to drink.  If you drink alcohol, limit how much you have: ? 0-1 drink a day for women. ? 0-2 drinks a day for men.  Be aware of how much alcohol is in your drink. In the [place] one drink equals one typical bottle of beer (12 oz), one-half glass of wine (5 oz), or one shot of hard liquor (1 oz).  Do not use any products that contain nicotine or tobacco, such as cigarettes and  e-cigarettes. If you need help quitting, ask your health care provider. Summary  Having a healthy lifestyle and getting preventive care can help to protect your health and wellness after age 14.  Screening and testing are the best way to find a health problem early and help you avoid having a fall. Early diagnosis and treatment give you the best chance for managing medical conditions that are more common for people who are older than age 35.  Falls are a major cause of broken bones and head injuries in people who are older than age 18. Take precautions to prevent a fall at home.  Work with your health care provider to learn what changes you can make to improve your health and wellness and to prevent falls. This information is not intended to replace advice given to you by your health  care provider. Make sure you discuss any questions you have with your health care provider. Document Revised: 04/07/2019 Document Reviewed: 10/28/2017 Elsevier Patient Education  2020 [employer].

## 2020-09-17 DIAGNOSIS — E139 Other specified diabetes mellitus without complications: Secondary | ICD-10-CM | POA: Diagnosis not present

## 2020-09-17 DIAGNOSIS — Z8546 Personal history of malignant neoplasm of prostate: Secondary | ICD-10-CM | POA: Diagnosis not present

## 2020-09-17 LAB — PSA: PSA: 0.02

## 2020-09-17 LAB — HEMOGLOBIN A1C: Hemoglobin A1C: 5.6

## 2020-10-09 ENCOUNTER — Other Ambulatory Visit: Payer: Self-pay | Admitting: Internal Medicine

## 2020-10-09 ENCOUNTER — Telehealth: Payer: Self-pay | Admitting: Internal Medicine

## 2020-10-09 DIAGNOSIS — K7581 Nonalcoholic steatohepatitis (NASH): Secondary | ICD-10-CM

## 2020-10-09 NOTE — Telephone Encounter (Signed)
Pt has been informed and has made an appt.

## 2020-10-09 NOTE — Telephone Encounter (Signed)
Corey [name] with [facility] called and was wondering if an A1C lab order could be placed for the patient. She also states that the patient told her that the office was too far for him and was wondering if they could be done at another lab. Please call the patient [phone].

## 2020-10-22 ENCOUNTER — Encounter: Payer: Self-pay | Admitting: Internal Medicine

## 2020-10-22 ENCOUNTER — Ambulatory Visit (INDEPENDENT_AMBULATORY_CARE_PROVIDER_SITE_OTHER): Payer: Medicare HMO | Admitting: Internal Medicine

## 2020-10-22 ENCOUNTER — Other Ambulatory Visit: Payer: Self-pay

## 2020-10-22 VITALS — BP 122/74 | HR 63 | Temp 98.3°F | Ht 70.0 in | Wt 189.0 lb

## 2020-10-22 DIAGNOSIS — K7581 Nonalcoholic steatohepatitis (NASH): Secondary | ICD-10-CM

## 2020-10-22 DIAGNOSIS — Z0000 Encounter for general adult medical examination without abnormal findings: Secondary | ICD-10-CM

## 2020-10-22 DIAGNOSIS — Z23 Encounter for immunization: Secondary | ICD-10-CM | POA: Insufficient documentation

## 2020-10-22 DIAGNOSIS — I10 Essential (primary) hypertension: Secondary | ICD-10-CM | POA: Diagnosis not present

## 2020-10-22 DIAGNOSIS — E785 Hyperlipidemia, unspecified: Secondary | ICD-10-CM

## 2020-10-22 LAB — CBC WITH DIFFERENTIAL/PLATELET
Basophils Absolute: 0 10*3/uL (ref 0.0–0.1)
Basophils Relative: 0.7 % (ref 0.0–3.0)
Eosinophils Absolute: 0.1 10*3/uL (ref 0.0–0.7)
Eosinophils Relative: 1.3 % (ref 0.0–5.0)
HCT: 46.7 % (ref 39.0–52.0)
Hemoglobin: 16 g/dL (ref 13.0–17.0)
Lymphocytes Relative: 12.4 % (ref 12.0–46.0)
Lymphs Abs: 0.6 10*3/uL — ABNORMAL LOW (ref 0.7–4.0)
MCHC: 34.3 g/dL (ref 30.0–36.0)
MCV: 87.3 fl (ref 78.0–100.0)
Monocytes Absolute: 0.4 10*3/uL (ref 0.1–1.0)
Monocytes Relative: 9.3 % (ref 3.0–12.0)
Neutro Abs: 3.7 10*3/uL (ref 1.4–7.7)
Neutrophils Relative %: 76.3 % (ref 43.0–77.0)
Platelets: 174 10*3/uL (ref 150.0–400.0)
RBC: 5.35 Mil/uL (ref 4.22–5.81)
RDW: 13 % (ref 11.5–15.5)
WBC: 4.8 10*3/uL (ref 4.0–10.5)

## 2020-10-22 LAB — LIPID PANEL
Cholesterol: 156 mg/dL (ref 0–200)
HDL: 67.7 mg/dL (ref >39.00)
LDL Cholesterol: 83 mg/dL (ref 0–99)
NonHDL: 88.78
Total CHOL/HDL Ratio: 2
Triglycerides: 30 mg/dL (ref 0.0–149.0)
VLDL: 6 mg/dL (ref 0.0–40.0)

## 2020-10-22 LAB — BASIC METABOLIC PANEL
BUN: 17 mg/dL (ref 6–23)
CO2: 29 mEq/L (ref 19–32)
Calcium: 9.3 mg/dL (ref 8.4–10.5)
Chloride: 102 mEq/L (ref 96–112)
Creatinine, Ser: 0.93 mg/dL (ref 0.40–1.50)
GFR: 83.95 mL/min (ref >60.00)
Glucose, Bld: 99 mg/dL (ref 70–99)
Potassium: 3.8 mEq/L (ref 3.5–5.1)
Sodium: 139 mEq/L (ref 135–145)

## 2020-10-22 LAB — HEPATIC FUNCTION PANEL
ALT: 29 U/L (ref 0–53)
AST: 69 U/L — ABNORMAL HIGH (ref 0–37)
Albumin: 4.5 g/dL (ref 3.5–5.2)
Alkaline Phosphatase: 71 U/L (ref 39–117)
Bilirubin, Direct: 0.2 mg/dL (ref 0.0–0.3)
Total Bilirubin: 0.6 mg/dL (ref 0.2–1.2)
Total Protein: 7.1 g/dL (ref 6.0–8.3)

## 2020-10-22 LAB — TSH: TSH: 2.6 u[IU]/mL (ref 0.35–4.50)

## 2020-10-22 LAB — PROTIME-INR
INR: 1 ratio (ref 0.8–1.0)
Prothrombin Time: 11.6 s (ref 9.6–13.1)

## 2020-10-22 MED ORDER — PIOGLITAZONE HCL 15 MG PO TABS: 15.0000 mg | ORAL_TABLET | Freq: Every day | ORAL | 1 refills | Status: DC

## 2020-10-22 MED ORDER — SHINGRIX 50 MCG/0.5ML IM SUSR: 0.5000 mL | Freq: Once | INTRAMUSCULAR | 1 refills | Status: AC

## 2020-10-22 NOTE — Patient Instructions (Signed)

## 2020-10-22 NOTE — Progress Notes (Signed)
Subjective:  Patient ID: Corey [name], male    DOB: December 12, 1951  Age: 69 y.o. MRN: [id number]  CC: Annual Exam  This visit occurred during the SARS-CoV-2 public health emergency.  Safety protocols were in place, including screening questions prior to the visit, additional usage of staff PPE, and extensive cleaning of exam room while observing appropriate contact time as indicated for disinfecting solutions.    HPI Corey [name] presents for a CPX.  He has recently seen a urologist and ophthalmologist.  He tells me he is scheduled for a penile implant soon.  He is active and denies any recent episodes of chest pain, shortness of breath, palpitations,, edema, or fatigue.  He would like to restart pioglitazone.  He tells me his blood sugars have been well controlled and he denies polys.  He has intentionally lost weight with NOOM.  Outpatient Medications Prior to Visit  Medication Sig Dispense Refill  . amLODipine (NORVASC) 10 MG tablet TAKE 1 TABLET BY MOUTH EVERY DAY 90 tablet 1  . benazepril (LOTENSIN) 20 MG tablet TAKE 1 TABLET BY MOUTH EVERY DAY 90 tablet 1  . Multiple Vitamin (MULTIVITAMIN WITH MINERALS) TABS tablet Take 1 tablet by mouth daily.    Marland Kitchen atorvastatin (LIPITOR) 10 MG tablet Take 1 tablet (10 mg total) by mouth daily. 90 tablet 1  . cyanocobalamin 2000 MCG tablet Take 1 tablet (2,000 mcg total) by mouth daily. 90 tablet 3  . ferrous sulfate 325 (65 FE) MG tablet Take 1 tablet (325 mg total) by mouth daily with breakfast. 90 tablet 1  . hydrochlorothiazide (HYDRODIURIL) 25 MG tablet Take 1 tablet (25 mg total) by mouth daily. 90 tablet 1  . Multiple Vitamins-Iron (MULTIVITAMIN PLUS IRON ADULT) TABS Take 1 tablet by mouth daily.    Marland Kitchen OVER THE COUNTER MEDICATION Take 1 tablet by mouth daily. Silencil - tinnitus treatment    . pioglitazone (ACTOS) 15 MG tablet Take 1 tablet (15 mg total) by mouth daily. 90 tablet 1   No facility-administered medications prior to visit.     ROS Review of Systems  Constitutional: Negative for appetite change, diaphoresis, fatigue and fever.  HENT: Negative.   Eyes: Negative.   Respiratory: Negative for cough, chest tightness, shortness of breath and wheezing.   Cardiovascular: Negative for chest pain, palpitations and leg swelling.  Gastrointestinal: Negative for abdominal pain, constipation, diarrhea, nausea and vomiting.  Endocrine: Negative.   Genitourinary: Negative.  Negative for difficulty urinating.  Musculoskeletal: Negative for arthralgias and myalgias.  Skin: Negative.   Neurological: Negative.  Negative for dizziness, weakness, light-headedness and headaches.  Hematological: Negative for adenopathy. Does not bruise/bleed easily.  Psychiatric/Behavioral: Negative.     Objective:  BP 122/74   Pulse 63   Temp 98.3 F (36.8 C) (Oral)   Ht 5' 10"  (1.778 m)   Wt 189 lb (85.7 kg)   SpO2 98%   BMI 27.12 kg/m   BP Readings from Last 3 Encounters:  10/22/20 122/74  04/09/20 122/80  05/10/19 130/80    Wt Readings from Last 3 Encounters:  10/22/20 189 lb (85.7 kg)  04/09/20 203 lb (92.1 kg)  05/10/19 207 lb (93.9 kg)    Physical Exam Vitals reviewed.  Constitutional:      Appearance: Normal appearance.  HENT:     Nose: Nose normal.     Mouth/Throat:     Mouth: Mucous membranes are moist.  Eyes:     General: No scleral icterus.    Conjunctiva/sclera: Conjunctivae normal.  Cardiovascular:     Rate and Rhythm: Normal rate and regular rhythm.     Heart sounds: No murmur heard.   Pulmonary:     Effort: Pulmonary effort is normal.     Breath sounds: No stridor. No wheezing, rhonchi or rales.  Abdominal:     General: Abdomen is flat.     Palpations: There is no mass.     Tenderness: There is no abdominal tenderness. There is no guarding.  Musculoskeletal:        General: Normal range of motion.     Cervical back: Neck supple.     Right lower leg: No edema.     Left lower leg: No edema.   Lymphadenopathy:     Cervical: No cervical adenopathy.  Skin:    General: Skin is warm and dry.     Coloration: Skin is not pale.  Neurological:     General: No focal deficit present.     Mental Status: He is alert and oriented to person, place, and time. Mental status is at baseline.  Psychiatric:        Mood and Affect: Mood normal.        Behavior: Behavior normal.     Lab Results  Component Value Date   WBC 4.8 10/22/2020   HGB 16.0 10/22/2020   HCT 46.7 10/22/2020   PLT 174.0 10/22/2020   GLUCOSE 99 10/22/2020   CHOL 156 10/22/2020   TRIG 30.0 10/22/2020   HDL 67.70 10/22/2020   LDLCALC 83 10/22/2020   ALT 29 10/22/2020   AST 69 (H) 10/22/2020   NA 139 10/22/2020   K 3.8 10/22/2020   CL 102 10/22/2020   CREATININE 0.93 10/22/2020   BUN 17 10/22/2020   CO2 29 10/22/2020   TSH 2.60 10/22/2020   PSA 0.02 09/17/2020   INR 1.0 10/22/2020   HGBA1C 5.6 09/17/2020    US Abdomen Limited RUQ  Result Date: 06/13/2019 CLINICAL DATA:  Elevated LFTs EXAM: ULTRASOUND ABDOMEN LIMITED RIGHT UPPER QUADRANT COMPARISON:  None. FINDINGS: Gallbladder: No gallstones or wall thickening visualized. No sonographic Murphy sign noted by sonographer. Common bile duct: Diameter: 5 mm Liver: No solid lesion identified. Simple cyst measuring 2.1 cm of the right lobe. Heterogeneous echotexture. Portal vein is patent on color Doppler imaging with normal direction of blood flow towards the liver. IMPRESSION: Heterogeneous hepatic echotexture suggestive of chronic parenchymal liver disease. No overt stigmata of cirrhosis. No other findings to explain elevated LFTs. Electronically Signed   By: Corey [name] M.D.   On: 06/13/2019 08:33    Assessment & Plan:   Corey [name] was seen today for annual exam.  Diagnoses and all orders for this visit:  Routine general medical examination at a health care facility- Exam completed, labs reviewed, vaccines reviewed and updated, cancer screenings are up-to-date,  patient education was given.  Nonalcoholic steatohepatitis (NASH)- His LFTs remain mildly elevated.  He very rarely drinks wine.  I recommended that he restart pioglitazone. -     pioglitazone (ACTOS) 15 MG tablet; Take 1 tablet (15 mg total) by mouth daily. -     Hepatic function panel; Future -     Protime-INR; Future -     Protime-INR -     Hepatic function panel  Hyperlipidemia with target LDL less than 130- He has an elevated ASCVD risk score.  I recommended that he take a statin for CV risk reduction. -     Lipid panel; Future -  TSH; Future -     TSH -     Lipid panel  Essential hypertension, benign- His blood pressure is a bit overcontrolled.  I recommended that he stop taking the thiazide diuretic but to stay on amlodipine and the ACE inhibitor. -     CBC with Differential/Platelet; Future -     Basic metabolic panel; Future -     TSH; Future -     TSH -     Basic metabolic panel -     CBC with Differential/Platelet  Flu vaccine need -     Flu Vaccine QUAD High Dose(Fluad)  Need for shingles vaccine -     Zoster Vaccine Adjuvanted [facility]) injection; Inject 0.5 mLs into the muscle once for 1 dose.   I have discontinued Traylon Texidor's ferrous sulfate, cyanocobalamin, hydrochlorothiazide, OVER THE COUNTER MEDICATION, and Multivitamin Plus Iron Adult. I am also having him start on Shingrix. Additionally, I am having him maintain his multivitamin with minerals, benazepril, amLODipine, pioglitazone, and atorvastatin.  Meds ordered this encounter  Medications  . pioglitazone (ACTOS) 15 MG tablet    Sig: Take 1 tablet (15 mg total) by mouth daily.    Dispense:  90 tablet    Refill:  1  . Zoster Vaccine Adjuvanted [facility]) injection    Sig: Inject 0.5 mLs into the muscle once for 1 dose.    Dispense:  0.5 mL    Refill:  1  . atorvastatin (LIPITOR) 10 MG tablet    Sig: Take 1 tablet (10 mg total) by mouth daily.    Dispense:  90 tablet    Refill:  1      Follow-up: Return in about 6 months (around 04/22/2021).  Scarlette Calico, MD

## 2020-10-23 MED ORDER — ATORVASTATIN CALCIUM 10 MG PO TABS: 10.0000 mg | ORAL_TABLET | Freq: Every day | ORAL | 1 refills | Status: DC

## 2020-10-29 DIAGNOSIS — H353221 Exudative age-related macular degeneration, left eye, with active choroidal neovascularization: Secondary | ICD-10-CM | POA: Diagnosis not present

## 2020-10-29 DIAGNOSIS — H353112 Nonexudative age-related macular degeneration, right eye, intermediate dry stage: Secondary | ICD-10-CM | POA: Diagnosis not present

## 2020-10-29 DIAGNOSIS — H43813 Vitreous degeneration, bilateral: Secondary | ICD-10-CM | POA: Diagnosis not present

## 2020-10-30 DIAGNOSIS — N529 Male erectile dysfunction, unspecified: Secondary | ICD-10-CM | POA: Diagnosis not present

## 2020-10-30 DIAGNOSIS — K219 Gastro-esophageal reflux disease without esophagitis: Secondary | ICD-10-CM | POA: Diagnosis not present

## 2020-10-30 DIAGNOSIS — I10 Essential (primary) hypertension: Secondary | ICD-10-CM | POA: Diagnosis not present

## 2020-10-30 DIAGNOSIS — N5231 Erectile dysfunction following radical prostatectomy: Secondary | ICD-10-CM | POA: Diagnosis not present

## 2021-01-04 DIAGNOSIS — H353221 Exudative age-related macular degeneration, left eye, with active choroidal neovascularization: Secondary | ICD-10-CM | POA: Diagnosis not present

## 2021-02-15 DIAGNOSIS — H353221 Exudative age-related macular degeneration, left eye, with active choroidal neovascularization: Secondary | ICD-10-CM | POA: Diagnosis not present

## 2021-03-01 DIAGNOSIS — R800 Isolated proteinuria: Secondary | ICD-10-CM | POA: Diagnosis not present

## 2021-03-01 DIAGNOSIS — N320 Bladder-neck obstruction: Secondary | ICD-10-CM | POA: Diagnosis not present

## 2021-03-01 DIAGNOSIS — R310 Gross hematuria: Secondary | ICD-10-CM | POA: Diagnosis not present

## 2021-03-01 DIAGNOSIS — D72829 Elevated white blood cell count, unspecified: Secondary | ICD-10-CM | POA: Diagnosis not present

## 2021-03-01 DIAGNOSIS — N5231 Erectile dysfunction following radical prostatectomy: Secondary | ICD-10-CM | POA: Diagnosis not present

## 2021-03-01 DIAGNOSIS — N3040 Irradiation cystitis without hematuria: Secondary | ICD-10-CM | POA: Diagnosis not present

## 2021-03-01 DIAGNOSIS — R718 Other abnormality of red blood cells: Secondary | ICD-10-CM | POA: Diagnosis not present

## 2021-03-01 DIAGNOSIS — Z8546 Personal history of malignant neoplasm of prostate: Secondary | ICD-10-CM | POA: Diagnosis not present

## 2021-03-01 DIAGNOSIS — R319 Hematuria, unspecified: Secondary | ICD-10-CM | POA: Diagnosis not present

## 2021-03-01 DIAGNOSIS — N393 Stress incontinence (female) (male): Secondary | ICD-10-CM | POA: Diagnosis not present

## 2021-03-01 DIAGNOSIS — R82998 Other abnormal findings in urine: Secondary | ICD-10-CM | POA: Diagnosis not present

## 2021-03-13 ENCOUNTER — Ambulatory Visit (INDEPENDENT_AMBULATORY_CARE_PROVIDER_SITE_OTHER): Payer: Medicare HMO | Admitting: Pharmacist

## 2021-03-13 ENCOUNTER — Telehealth: Payer: Self-pay | Admitting: Internal Medicine

## 2021-03-13 ENCOUNTER — Other Ambulatory Visit: Payer: Self-pay

## 2021-03-13 DIAGNOSIS — I10 Essential (primary) hypertension: Secondary | ICD-10-CM | POA: Diagnosis not present

## 2021-03-13 DIAGNOSIS — K7581 Nonalcoholic steatohepatitis (NASH): Secondary | ICD-10-CM

## 2021-03-13 DIAGNOSIS — E785 Hyperlipidemia, unspecified: Secondary | ICD-10-CM

## 2021-03-13 NOTE — Progress Notes (Signed)
Chronic Care Management Pharmacy Note  03/13/2021 Name:  Corey [name] MRN:  [id number] DOB:  1951-09-07  Subjective: Corey [name] is an 70 y.o. year old male who is a primary patient of Corey Lima, MD.  The CCM team was consulted for assistance with disease management and care coordination needs.    Engaged with patient by telephone for follow up visit in response to provider referral for pharmacy case management and/or care coordination services.   Consent to Services:  The patient was given information about Chronic Care Management services, agreed to services, and gave verbal consent prior to initiation of services.  Please see initial visit note for detailed documentation.   Patient Care Team: Corey Lima, MD as PCP - General (Internal Medicine) Charlton Haws, [facility] as Pharmacist (Pharmacist)   Patient grew up in [place], job transferred to [employer] in 2011. 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 He has a daughter in [place].   Recent office visits: 10/18/20 Dr Ronnald Ramp OV: CPX. Restart pioglitazone.. Stopped HCTZ due to BP overcontrolled.  05/10/19 Dr Ronnald Ramp OV: BP controlled, no med changes. Elevated AST, ordered liver US to screen for NASH.  06/14/19: pt message - Liver US revealed NASH, started pioglitazone to reduce inflammation.  Recent consult visits: 09/13/20 Dr Odis Luster [facility] urology): f/u for ED s/p prostatectomy. Pt would like to pursue surgery.  Hospital visits: None in previous 6 months  Objective:  Lab Results  Component Value Date   NA 139 10/22/2020   K 3.8 10/22/2020   CO2 29 10/22/2020   GLUCOSE 99 10/22/2020   BUN 17 10/22/2020   CREATININE 0.93 10/22/2020   CALCIUM 9.3 10/22/2020   GFRNONAA >60 03/13/2017   GFRAA >60 03/13/2017    Lab Results  Component Value Date/Time   HGBA1C 5.6 09/17/2020 12:00 AM   HGBA1C 6.2 05/10/2019 04:16 PM   HGBA1C 5.6 06/29/2017 04:46 PM   GFR 83.95 10/22/2020 09:23 AM   GFR 64.58  05/10/2019 04:16 PM    Last diabetic Eye exam:  Lab Results  Component Value Date/Time   HMDIABEYEEXA No Retinopathy 09/06/2020 12:00 AM    Last diabetic Foot exam: No results found for: HMDIABFOOTEX   Lab Results  Component Value Date   CHOL 156 10/22/2020   HDL 67.70 10/22/2020   LDLCALC 83 10/22/2020   TRIG 30.0 10/22/2020   CHOLHDL 2 10/22/2020    Hepatic Function Latest Ref Rng & Units 10/22/2020 05/10/2019 03/13/2017  Total Protein 6.0 - 8.3 g/dL 7.1 7.3 6.0(L)  Albumin 3.5 - 5.2 g/dL 4.5 4.4 2.8(L)  AST 0 - 37 U/L 69(H) 63(H) 48(H)  ALT 0 - 53 U/L 29 26 19   Alk Phosphatase 39 - 117 U/L 71 80 72  Total Bilirubin 0.2 - 1.2 mg/dL 0.6 0.3 0.4  Bilirubin, Direct 0.0 - 0.3 mg/dL 0.2 - -    Lab Results  Component Value Date/Time   TSH 2.60 10/22/2020 09:23 AM   TSH 2.48 05/10/2019 04:16 PM    CBC Latest Ref Rng & Units 10/22/2020 05/10/2019 02/10/2018  WBC 4.0 - 10.5 K/uL 4.8 7.6 5.6  Hemoglobin 13.0 - 17.0 g/dL 16.0 13.9 14.6  Hematocrit 39.0 - 52.0 % 46.7 41.4 42.5  Platelets 150.0 - 400.0 K/uL 174.0 162.0 180.0    No results found for: VD25OH  Clinical ASCVD: No  The 10-year ASCVD risk score Mikey Bussing DC Jr., et al., 2013) is: 30.4%   Values used to calculate the  score:     Age: 32 years     Sex: Male     Is Non-Hispanic African American: No     Diabetic: Yes     Tobacco smoker: No     Systolic Blood Pressure: 009 mmHg     Is BP treated: Yes     HDL Cholesterol: 67.7 mg/dL     Total Cholesterol: 156 mg/dL    Depression screen [facility] 2/9 04/09/2020 07/19/2018 07/02/2017  Decreased Interest 0 0 0  Down, Depressed, Hopeless 0 0 0  PHQ - 2 Score 0 0 0  Altered sleeping - 0 -  Tired, decreased energy - 0 -  Change in appetite - 0 -  Feeling bad or failure about yourself  - 0 -  Trouble concentrating - 0 -  Moving slowly or fidgety/restless - 0 -  Suicidal thoughts - 0 -  PHQ-9 Score - 0 -  Difficult doing work/chores - Not difficult at all -     Social History    Tobacco Use  Smoking Status Never Smoker  Smokeless Tobacco Never Used   BP Readings from Last 3 Encounters:  10/22/20 122/74  04/09/20 122/80  05/10/19 130/80   Pulse Readings from Last 3 Encounters:  10/22/20 63  04/09/20 73  05/10/19 72   Wt Readings from Last 3 Encounters:  10/22/20 189 lb (85.7 kg)  04/09/20 203 lb (92.1 kg)  05/10/19 207 lb (93.9 kg)   BMI Readings from Last 3 Encounters:  10/22/20 27.12 kg/m  04/09/20 29.13 kg/m  05/10/19 29.70 kg/m   Assessment/Interventions: Review of patient past medical history, allergies, medications, health status, including review of consultants reports, laboratory and other test data, was performed as part of comprehensive evaluation and provision of chronic care management services.   SDOH:  (Social Determinants of Health) assessments and interventions performed: Yes  SDOH Screenings   Alcohol Screen: Not on file  Depression (PHQ2-9): Low Risk   . PHQ-2 Score: 0  Financial Resource Strain: Low Risk   . Difficulty of Paying Living Expenses: Not hard at all  Food Insecurity: Not on file  Housing: Not on file  Physical Activity: Not on file  Social Connections: Not on file  Stress: Not on file  Tobacco Use: Low Risk   . Smoking Tobacco Use: Never Smoker  . Smokeless Tobacco Use: Never Used  Transportation Needs: Not on file    [place]  No Known Allergies  Medications Reviewed Today    Reviewed by Charlton Haws, [facility] (Pharmacist) on 03/13/21 at 1647  Med List Status: <None>  Medication Order Taking? Sig Documenting Provider Last Dose Status Informant  amLODipine (NORVASC) 10 MG tablet [id number] Yes TAKE 1 TABLET BY MOUTH EVERY DAY Corey Lima, MD Taking Active   atorvastatin (LIPITOR) 10 MG tablet [id number] Yes Take 1 tablet (10 mg total) by mouth daily. Corey Lima, MD Taking Active   benazepril (LOTENSIN) 20 MG tablet [id number] Yes TAKE 1 TABLET BY MOUTH EVERY DAY Corey Lima, MD  Taking Active   Multiple Vitamin (MULTIVITAMIN WITH MINERALS) TABS tablet [id number] Yes Take 1 tablet by mouth daily. [provider] Taking Active Self  pioglitazone (ACTOS) 15 MG tablet [id number] Yes Take 1 tablet (15 mg total) by mouth daily. Corey Lima, MD Taking Active           Patient Active Problem List   Diagnosis Date Noted  . Need for shingles vaccine 10/22/2020  . Flu vaccine need  10/22/2020  . Hyperglycemia 06/29/2017  . Hyperlipidemia with target LDL less than 130 07/11/2016  . Essential hypertension, benign 08/05/2013  . Prostate cancer ([place]) 08/05/2013  . Routine general medical examination at a health care facility 08/05/2013    Immunization History  Administered Date(s) Administered  . Fluad Quad(high Dose 65+) 10/22/2020  . Influenza, High Dose Seasonal PF 02/10/2018  . Influenza-Unspecified 09/27/2019  . PFIZER(Purple Top)SARS-COV-2 Vaccination 03/15/2020, 04/10/2020, 10/04/2020  . Pneumococcal Conjugate-13 06/29/2017  . Pneumococcal Polysaccharide-23 08/05/2013, 05/10/2019  . Tdap 08/05/2013  . Zoster Recombinat (Shingrix) 10/29/2020    Conditions to be addressed/monitored:  Hypertension, Hyperlipidemia and NASH  Care Plan : [place]  Updates made by Charlton Haws, [place] since 03/13/2021 12:00 AM    Problem: Hypertension, Hyperlipidemia and NASH   Priority: High    Long-Range Goal: Disease management   Start Date: 03/13/2021  Expected End Date: 03/13/2022  This Visit's Progress: On track  Priority: High  Note:   Current Barriers:  . Unable to independently monitor therapeutic efficacy  Pharmacist Clinical Goal(s):  Marland Kitchen Patient will achieve adherence to monitoring guidelines and medication adherence to achieve therapeutic efficacy through collaboration with PharmD and provider.   Interventions: . 1:1 collaboration with Corey Lima, MD regarding development and update of comprehensive plan of care as evidenced by  provider attestation and co-signature . Inter-disciplinary care team collaboration (see longitudinal plan of care) . Comprehensive medication review performed; medication list updated in electronic medical record  Hypergylcemia/NASH    Patient has failed these meds in past: n/a Patient is currently controlled on the following medications:   pioglitazone 15 mg daily    We discussed: discussed what A1c is and diabetic goals as well as benefits for NASH. Pt was off pioglitazone for a little while but restarted after PCP visit in Oct 2021. Pt denies issues with pioglitazone.   Plan: Continue current medications and control with diet and exercise    Hypertension    BP goal < 130/80 Patient does not check BP at home but has frequent office visits with urologist and BP is always "normal"  Patient has tried/failed these meds in the past: HCTZ  Patient is currently controlled on the following medications:   amlodipine 10 mg daily  benazepril 20 mg daily   We discussed: pt reports adherence with medications; discussed there is no recent fills on dispense report via surescripts and meds were last prescribed 10/2019 by PCP. Pt denies getting refills from another physician, he says he someone got extra and has not missed any doses. Will coordinate with PCP to refill BP meds so patient has supply at pharmacy available.  Pt also reports he is still taking HCTZ. Discussed PCP had discontinued the medication in Oct 2021 due to BP slightly overcontrolled. Pt agreed to stop taking it from now on.   Plan: Continue current medications and control with diet and exercise  [facility]   Hyperlipidemia    LDL goal < 100 Patient has failed these meds in past: n/a Patient is currently controlled on the following medications:   atorvastatin 10 mg daily   We discussed:  diet and exercise extensively, pt has lost weight using Noom. Discussed role of cholesterol in heart  disease, benefits of statin.   Plan: Continue current medications and control with diet and exercise   Patient Goals/Self-Care Activities . Patient will:  - take medications as prescribed focus on medication adherence by pill box check blood  pressure as needed, document, and provide at future appointments target a minimum of 150 minutes of moderate intensity exercise weekly engage in dietary modifications by using Noom  Follow Up Plan: Telephone follow up appointment with care management team member scheduled for: 1 year      Medication Assistance: None required.  Patient affirms current coverage meets needs.  [id number] [place] [address] [id number]: [phone]: [phone] Uses pill box? Yes Pt endorses 100% compliance  We discussed: Current pharmacy is preferred with insurance plan and patient is satisfied with pharmacy services Patient decided to: Continue current medication management strategy  Care Plan and Follow Up Patient Decision:  Patient agrees to Care Plan and Follow-up.  Plan: Telephone follow up appointment with care management team member scheduled for:  1 year  LCharlene Brooke PharmD, BArcadia Outpatient Surgery Center LPClinical Pharmacist [facility] at [phone]

## 2021-03-13 NOTE — Patient Instructions (Signed)
Visit Information  Phone number for Pharmacist: [phone]  Goals Addressed   None    Patient Care Plan: CCM Pharmacy Care Plan    Problem Identified: Hypertension, Hyperlipidemia and NASH   Priority: High    Long-Range Goal: Disease management   Start Date: 03/13/2021  Expected End Date: 03/13/2022  This Visit's Progress: On track  Priority: High  Note:   Current Barriers:  . Unable to independently monitor therapeutic efficacy  Pharmacist Clinical Goal(s):  Marland Kitchen Patient will achieve adherence to monitoring guidelines and medication adherence to achieve therapeutic efficacy through collaboration with PharmD and provider.   Interventions: . 1:1 collaboration with Janith Lima, MD regarding development and update of comprehensive plan of care as evidenced by provider attestation and co-signature . Inter-disciplinary care team collaboration (see longitudinal plan of care) . Comprehensive medication review performed; medication list updated in electronic medical record  Hypergylcemia/NASH    Patient has failed these meds in past: n/a Patient is currently controlled on the following medications:   pioglitazone 15 mg daily    We discussed: discussed what A1c is and diabetic goals as well as benefits for NASH. Pt was off pioglitazone for a little while but restarted after PCP visit in Oct 2021. Pt denies issues with pioglitazone.   Plan: Continue current medications and control with diet and exercise    Hypertension    BP goal < 130/80 Patient does not check BP at home but has frequent office visits with urologist and BP is always "normal"  Patient has tried/failed these meds in the past: HCTZ  Patient is currently controlled on the following medications:   amlodipine 10 mg daily  benazepril 20 mg daily   We discussed: pt reports adherence with medications; discussed there is no recent fills on dispense report via surescripts and meds were last prescribed 10/2019 by  PCP. Pt denies getting refills from another physician, he says he someone got extra and has not missed any doses. Will coordinate with PCP to refill BP meds so patient has supply at pharmacy available.  Pt also reports he is still taking HCTZ. Discussed PCP had discontinued the medication in Oct 2021 due to BP slightly overcontrolled. Pt agreed to stop taking it from now on.   Plan: Continue current medications and control with diet and exercise  [facility]   Hyperlipidemia    LDL goal < 100 Patient has failed these meds in past: n/a Patient is currently controlled on the following medications:   atorvastatin 10 mg daily   We discussed:  diet and exercise extensively, pt has lost weight using Noom. Discussed role of cholesterol in heart disease, benefits of statin.   Plan: Continue current medications and control with diet and exercise   Patient Goals/Self-Care Activities . Patient will:  - take medications as prescribed focus on medication adherence by pill box check blood pressure as needed, document, and provide at future appointments target a minimum of 150 minutes of moderate intensity exercise weekly engage in dietary modifications by using Noom  Follow Up Plan: Telephone follow up appointment with care management team member scheduled for: 1 year     Patient verbalizes understanding of instructions provided today and agrees to view in [place].  Telephone follow up appointment with pharmacy team member scheduled for: 1 year  Charlene Brooke, PharmD, [facility] Clinical Pharmacist [facility] [phone]

## 2021-03-13 NOTE — Telephone Encounter (Signed)
Patient returned call. He can be reached at [phone]

## 2021-03-29 DIAGNOSIS — H353221 Exudative age-related macular degeneration, left eye, with active choroidal neovascularization: Secondary | ICD-10-CM | POA: Diagnosis not present

## 2021-04-01 ENCOUNTER — Other Ambulatory Visit: Payer: Self-pay | Admitting: Internal Medicine

## 2021-04-01 DIAGNOSIS — I10 Essential (primary) hypertension: Secondary | ICD-10-CM

## 2021-04-04 ENCOUNTER — Other Ambulatory Visit: Payer: Self-pay | Admitting: Internal Medicine

## 2021-04-04 DIAGNOSIS — I10 Essential (primary) hypertension: Secondary | ICD-10-CM

## 2021-04-28 ENCOUNTER — Other Ambulatory Visit: Payer: Self-pay | Admitting: Internal Medicine

## 2021-04-28 DIAGNOSIS — K7581 Nonalcoholic steatohepatitis (NASH): Secondary | ICD-10-CM

## 2021-04-28 DIAGNOSIS — E785 Hyperlipidemia, unspecified: Secondary | ICD-10-CM

## 2021-05-10 DIAGNOSIS — H353221 Exudative age-related macular degeneration, left eye, with active choroidal neovascularization: Secondary | ICD-10-CM | POA: Diagnosis not present

## 2021-05-10 DIAGNOSIS — H353112 Nonexudative age-related macular degeneration, right eye, intermediate dry stage: Secondary | ICD-10-CM | POA: Diagnosis not present

## 2021-05-10 DIAGNOSIS — H43812 Vitreous degeneration, left eye: Secondary | ICD-10-CM | POA: Diagnosis not present

## 2021-05-20 ENCOUNTER — Telehealth: Payer: Self-pay | Admitting: Internal Medicine

## 2021-05-20 NOTE — Telephone Encounter (Signed)
LVM for pt to rtn my call to schedule AWV with NHA. Please schedule AWV if pt calls the office

## 2021-05-21 ENCOUNTER — Telehealth: Payer: Self-pay | Admitting: Internal Medicine

## 2021-05-21 NOTE — Telephone Encounter (Signed)
LVM for pt to rtn my call to schedule AWV with NHA. Please schedule this appt if pt calls the office.

## 2021-06-03 ENCOUNTER — Other Ambulatory Visit: Payer: Self-pay

## 2021-06-03 ENCOUNTER — Ambulatory Visit (INDEPENDENT_AMBULATORY_CARE_PROVIDER_SITE_OTHER): Payer: Medicare HMO

## 2021-06-03 VITALS — BP 118/60 | HR 77 | Temp 98.2°F | Ht 70.0 in | Wt 208.6 lb

## 2021-06-03 DIAGNOSIS — Z0000 Encounter for general adult medical examination without abnormal findings: Secondary | ICD-10-CM | POA: Diagnosis not present

## 2021-06-03 NOTE — Progress Notes (Signed)
Subjective:   Corey [name] is a 70 y.o. male who presents for Medicare Annual/Subsequent preventive examination.  Review of Systems    No ROS. Medicare Wellness Visit. Additional risk factors are reflected in social history. Cardiac Risk Factors include: advanced age (>81mn, >>22women);dyslipidemia;hypertension;male gender Sleep Patterns: No sleep issues, feels rested on waking and sleeps 8 hours nightly. Home Safety/Smoke Alarms: Feels safe in home; uses home alarm. Smoke alarms in place. Living environment: 1-story home; Lives with spouse; no needs for DME; good support system. Seat Belt Safety/Bike Helmet: Wears seat belt.    Objective:    Today's Vitals   06/03/21 1446  BP: 118/60  Pulse: 77  Temp: 98.2 F (36.8 C)  SpO2: 97%  Weight: 208 lb 9.6 oz (94.6 kg)  Height: 5' 10"  (1.778 m)  PainSc: 0-No pain   Body mass index is 29.93 kg/m.  Advanced Directives 06/03/2021 04/09/2020 07/19/2018 07/02/2017 03/12/2017 03/10/2017  Does Patient Have a Medical Advance Directive? No No No No No No  Would patient like information on creating a medical advance directive? No - Patient declined Yes (ED - Information included in AVS) Yes (ED - Information included in AVS) Yes (Inpatient - patient defers creating a medical advance directive at this time) No - Patient declined Yes (MAU/Ambulatory/Procedural Areas - Information given)    Current Medications (verified) Outpatient Encounter Medications as of 06/03/2021  Medication Sig  . amLODipine (NORVASC) 10 MG tablet TAKE 1 TABLET BY MOUTH EVERY DAY  . atorvastatin (LIPITOR) 10 MG tablet TAKE 1 TABLET BY MOUTH EVERY DAY  . benazepril (LOTENSIN) 20 MG tablet TAKE 1 TABLET BY MOUTH EVERY DAY  . Multiple Vitamin (MULTIVITAMIN WITH MINERALS) TABS tablet Take 1 tablet by mouth daily.  . pioglitazone (ACTOS) 15 MG tablet TAKE 1 TABLET BY MOUTH EVERY DAY   No facility-administered encounter medications on file as of 06/03/2021.    Allergies  (verified) Patient has no known allergies.   History: Past Medical History:  Diagnosis Date  . Arthritis    "minor; elbows, wrists" (03/12/2017)  . BPH (benign prostatic hyperplasia) 2011  . Hypertension   . Incisional hernia   . [place] [place]   S/P prostatectomy, TURP, radiation in 2017   Past Surgical History:  Procedure Laterality Date  . HERNIA REPAIR    . INCISIONAL HERNIA REPAIR N/A 03/12/2017   Procedure: LAPAROSCOPIC INCISIONAL HERNIA REPAIR;  Surgeon: TErroll Luna MD;  [place] [facility]  Service: General;  Laterality: N/A;  . INGUINAL HERNIA REPAIR Right ~ 2007  . INSERTION OF MESH N/A 03/12/2017   Procedure: INSERTION OF MESH;  Surgeon: TErroll Luna MD;  [facility]  Service: General;  Laterality: N/A;  . LAPAROSCOPIC INCISIONAL / UMBILICAL / [facility] 03/12/2017   IHR w/mesh  . PROSTATECTOMY  2017  . TRANSURETHRAL RESECTION OF PROSTATE  2017   "after they removed my prostate"  . UMBILICAL HERNIA REPAIR  ~ 2012   Family History  Problem Relation Age of Onset  . Cancer Mother   . Alcohol abuse Sister   . Early death Sister   . Stroke Neg Hx   . Kidney disease Neg Hx   . Hypertension Neg Hx   . Heart disease Neg Hx   . Hyperlipidemia Neg Hx   . Diabetes Neg Hx   . Depression Neg Hx   . COPD Neg Hx   . Asthma Neg Hx    Social History   Socioeconomic History  .  Marital status: Married    Spouse name: Not on file  . Number of children: 3  . Years of education: Not on file  . Highest education level: 12th grade  Occupational History  . Not on file  Tobacco Use  . Smoking status: Never Smoker  . Smokeless tobacco: Never Used  Vaping Use  . Vaping Use: Never used  Substance and Sexual Activity  . Alcohol use: Yes    Alcohol/week: 5.0 standard drinks    Types: 5 Glasses of wine per week  . Drug use: No  . Sexual activity: Yes    Partners: Male    Birth control/protection: None  Other Topics Concern  . Not on file   Social History Narrative  . Not on file   Social Determinants of Health   Financial Resource Strain: Low Risk   . Difficulty of Paying Living Expenses: Not hard at all  Food Insecurity: No Food Insecurity  . Worried About Charity fundraiser in the Last Year: Never true  . Ran Out of Food in the Last Year: Never true  Transportation Needs: No Transportation Needs  . Lack of Transportation (Medical): No  . Lack of Transportation (Non-Medical): No  Physical Activity: Sufficiently Active  . Days of Exercise per Week: 5 days  . Minutes of Exercise per Session: 30 min  Stress: No Stress Concern Present  . Feeling of Stress : Not at all  Social Connections: Socially Integrated  . Frequency of Communication with Friends and Family: More than three times a week  . Frequency of Social Gatherings with Friends and Family: More than three times a week  . Attends Religious Services: More than 4 times per year  . Active Member of Clubs or Organizations: No  . Attends Archivist Meetings: More than 4 times per year  . Marital Status: Married    Tobacco Counseling Counseling given: Not Answered   Clinical Intake:  Pre-visit preparation completed: Yes  Pain : No/denies pain Pain Score: 0-No pain     BMI - recorded: 29.93 Nutritional Status: BMI 25 -29 Overweight Nutritional Risks: None Diabetes: No  How often do you need to have someone help you when you read instructions, pamphlets, or other written materials from your doctor or pharmacy?: 1 - Never What is the last grade level you completed in school?: 1 year of college  Diabetic? no  Interpreter Needed?: No  Information entered by :: Lisette Abu, LPN   Activities of Daily Living In your present state of health, do you have any difficulty performing the following activities: 06/03/2021  Hearing? N  Vision? N  Difficulty concentrating or making decisions? N  Walking or climbing stairs? N  Dressing or  bathing? N  Doing errands, shopping? N  Preparing Food and eating ? N  Using the Toilet? N  In the past six months, have you accidently leaked urine? N  Do you have problems with loss of bowel control? N  Managing your Medications? N  Managing your Finances? N  Housekeeping or managing your Housekeeping? N  Some recent data might be hidden    Patient Care Team: Janith Lima, MD as PCP - General (Internal Medicine) Charlton Haws, [facility] as Pharmacist (Pharmacist)  Indicate any recent Medical Services you may have received from other than [facility] providers in the past year (date may be approximate).     Assessment:   This is a routine wellness examination for Diamond.  Hearing/Vision screen No exam  data present  Dietary issues and exercise activities discussed: Current Exercise Habits: Home exercise routine (Patient has a full time job which is physically strenuous), Type of exercise: walking, Time (Minutes): 60, Frequency (Times/Week): 5, Weekly Exercise (Minutes/Week): 300, Intensity: Moderate, Exercise limited by: None identified  Goals Addressed   None    Depression Screen PHQ 2/9 Scores 06/03/2021 04/09/2020 07/19/2018 07/02/2017  PHQ - 2 Score 0 0 0 0  PHQ- 9 Score - - 0 -    Fall Risk Fall Risk  06/03/2021 04/09/2020 07/19/2018 07/02/2017  Falls in the past year? 0 0 No No  Number falls in past yr: 0 0 - -  Injury with Fall? 0 0 - -  Risk for fall due to : No Fall Risks No Fall Risks - -  Follow up Falls evaluation completed Falls evaluation completed;Education provided;Falls prevention discussed - -    FALL RISK PREVENTION PERTAINING TO THE HOME:  Any stairs in or around the home? No  If so, are there any without handrails? No  Home free of loose throw rugs in walkways, pet beds, electrical cords, etc? Yes  Adequate lighting in your home to reduce risk of falls? Yes   ASSISTIVE DEVICES UTILIZED TO PREVENT FALLS:  Life alert? No  Use of a cane, walker or w/c? No   Grab bars in the bathroom? No  Shower chair or bench in shower? No  Elevated toilet seat or a handicapped toilet? No   TIMED UP AND GO:  Was the test performed? No .  Length of time to ambulate 10 feet: 0 sec.   Gait steady and fast without use of assistive device  Cognitive Function: Normal cognitive status assessed by direct observation by this Nurse Health Advisor. No abnormalities found.       6CIT Screen 04/09/2020  What Year? 0 points  What month? 0 points  What time? 0 points  Count back from 20 0 points  Months in reverse 0 points  Repeat phrase 0 points  Total Score 0    Immunizations Immunization History  Administered Date(s) Administered  . Fluad Quad(high Dose 65+) 10/22/2020  . Influenza, High Dose Seasonal PF 02/10/2018  . Influenza-Unspecified 09/27/2019  . PFIZER(Purple Top)SARS-COV-2 Vaccination 03/15/2020, 04/10/2020, 10/04/2020, 04/04/2021  . Pneumococcal Conjugate-13 06/29/2017  . Pneumococcal Polysaccharide-23 08/05/2013, 05/10/2019  . Tdap 08/05/2013  . Zoster Recombinat (Shingrix) 10/29/2020    TDAP status: Up to date  Flu Vaccine status: Up to date  Pneumococcal vaccine status: Up to date  Covid-19 vaccine status: Completed vaccines  Qualifies for Shingles Vaccine? Yes   Zostavax completed No   Shingrix Completed?: No.    Education has been provided regarding the importance of this vaccine. Patient has been advised to call insurance company to determine out of pocket expense if they have not yet received this vaccine. Advised may also receive vaccine at local pharmacy or Health Dept. Verbalized acceptance and understanding.  Screening Tests Health Maintenance  Topic Date Due  . Pneumococcal Vaccine 63-9 Years old (1 of 4 - PCV13) Never done  . Zoster Vaccines- Shingrix (2 of 2) 12/24/2020  . INFLUENZA VACCINE  07/29/2021  . TETANUS/TDAP  08/06/2023  . COLONOSCOPY (Pts 45-14yr Insurance coverage will need to be confirmed)  03/11/2026   . COVID-19 Vaccine  Completed  . Hepatitis C Screening  Completed  . PNA vac Low Risk Adult  Completed  . HPV VACCINES  Aged Out    Health Maintenance  Health Maintenance Due  Topic  Date Due  . Pneumococcal Vaccine 26-32 Years old (1 of 4 - PCV13) Never done  . Zoster Vaccines- Shingrix (2 of 2) 12/24/2020    Colorectal cancer screening: Type of screening: Colonoscopy. Completed 03/11/2016. Repeat every 10 years  Lung Cancer Screening: (Low Dose CT Chest recommended if Age 70-80 years, 30 pack-year currently smoking OR have quit w/in 15years.) does not qualify.   Lung Cancer Screening Referral: no  Additional Screening:  Hepatitis C Screening: does qualify; Completed yes  Vision Screening: Recommended annual ophthalmology exams for early detection of glaucoma and other disorders of the eye. Is the patient up to date with their annual eye exam?  Yes  Who is the provider or what is the name of the office in which the patient attends annual eye exams? Vernie Shanks, MD [facility]) If pt is not established with a provider, would they like to be referred to a provider to establish care? No .   Dental Screening: Recommended annual dental exams for proper oral hygiene  Community Resource Referral / Chronic Care Management: CRR required this visit?  No   CCM required this visit?  No      Plan:     I have personally reviewed and noted the following in the patient's chart:   . Medical and social history . Use of alcohol, tobacco or illicit drugs  . Current medications and supplements including opioid prescriptions. Patient is not currently taking opioid prescriptions. . Functional ability and status . Nutritional status . Physical activity . Advanced directives . List of other physicians . Hospitalizations, surgeries, and ER visits in previous 12 months . Vitals . Screenings to include cognitive, depression, and falls . Referrals and  appointments  In addition, I have reviewed and discussed with patient certain preventive protocols, quality metrics, and best practice recommendations. A written personalized care plan for preventive services as well as general preventive health recommendations were provided to patient.     Sheral Flow, LPN   09/01/8545   Nurse Notes: n/a

## 2021-06-03 NOTE — Patient Instructions (Signed)
Corey [name] , Thank you for taking time to come for your Medicare Wellness Visit. I appreciate your ongoing commitment to your health goals. Please review the following plan we discussed and let me know if I can assist you in the future.   Screening recommendations/referrals: Colonoscopy: 03/11/2016; due every 10 years Recommended yearly ophthalmology/optometry visit for glaucoma screening and checkup Recommended yearly dental visit for hygiene and checkup  Vaccinations: Influenza vaccine: 10/22/2020 Pneumococcal vaccine: 7/2/20118, 05/10/2019 Tdap vaccine: 08/05/2013; due every 10 years Shingles vaccine: 10/29/2020   Covid-19:03/15/2020, 04/10/2020, 10/04/2020  Advanced directives: Advance directive discussed with you today. Even though you declined this today please call our office should you change your mind and we can give you the proper paperwork for you to fill out.  Conditions/risks identified: Yes; Reviewed health maintenance screenings with patient today and relevant education, vaccines, and/or referrals were provided. Please continue to do your personal lifestyle choices by: daily care of teeth and gums, regular physical activity (goal should be 5 days a week for 30 minutes), eat a healthy diet, avoid tobacco and drug use, limiting any alcohol intake, taking a low-dose aspirin (if not allergic or have been advised by your provider otherwise) and taking vitamins and minerals as recommended by your provider. Continue doing brain stimulating activities (puzzles, reading, adult coloring books, staying active) to keep memory sharp. Continue to eat heart healthy diet (full of fruits, vegetables, whole grains, lean protein, water--limit salt, fat, and sugar intake) and increase physical activity as tolerated.  Next appointment: Please schedule your next Medicare Wellness Visit with your Nurse Health Advisor in 1 year by calling [phone].  Preventive Care 9 Years and Older, Male Preventive care  refers to lifestyle choices and visits with your health care provider that can promote health and wellness. What does preventive care include?  A yearly physical exam. This is also called an annual well check.  Dental exams once or twice a year.  Routine eye exams. Ask your health care provider how often you should have your eyes checked.  Personal lifestyle choices, including:  Daily care of your teeth and gums.  Regular physical activity.  Eating a healthy diet.  Avoiding tobacco and drug use.  Limiting alcohol use.  Practicing safe sex.  Taking low doses of aspirin every day.  Taking vitamin and mineral supplements as recommended by your health care provider. What happens during an annual well check? The services and screenings done by your health care provider during your annual well check will depend on your age, overall health, lifestyle risk factors, and family history of disease. Counseling  Your health care provider may ask you questions about your:  Alcohol use.  Tobacco use.  Drug use.  Emotional well-being.  Home and relationship well-being.  Sexual activity.  Eating habits.  History of falls.  Memory and ability to understand (cognition).  Work and work Statistician. Screening  You may have the following tests or measurements:  Height, weight, and BMI.  Blood pressure.  Lipid and cholesterol levels. These may be checked every 5 years, or more frequently if you are over 26 years old.  Skin check.  Lung cancer screening. You may have this screening every year starting at age 55 if you have a 30-pack-year history of smoking and currently smoke or have quit within the past 15 years.  Fecal occult blood test (FOBT) of the stool. You may have this test every year starting at age 47.  Flexible sigmoidoscopy or colonoscopy. You may have  a sigmoidoscopy every 5 years or a colonoscopy every 10 years starting at age 71.  Prostate cancer screening.  Recommendations will vary depending on your family history and other risks.  Hepatitis C blood test.  Hepatitis B blood test.  Sexually transmitted disease (STD) testing.  Diabetes screening. This is done by checking your blood sugar (glucose) after you have not eaten for a while (fasting). You may have this done every 1-3 years.  Abdominal aortic aneurysm (AAA) screening. You may need this if you are a current or former smoker.  Osteoporosis. You may be screened starting at age 28 if you are at high risk. Talk with your health care provider about your test results, treatment options, and if necessary, the need for more tests. Vaccines  Your health care provider may recommend certain vaccines, such as:  Influenza vaccine. This is recommended every year.  Tetanus, diphtheria, and acellular pertussis (Tdap, Td) vaccine. You may need a Td booster every 10 years.  Zoster vaccine. You may need this after age 39.  Pneumococcal 13-valent conjugate (PCV13) vaccine. One dose is recommended after age 18.  Pneumococcal polysaccharide (PPSV23) vaccine. One dose is recommended after age 36. Talk to your health care provider about which screenings and vaccines you need and how often you need them. This information is not intended to replace advice given to you by your health care provider. Make sure you discuss any questions you have with your health care provider. Document Released: 01/11/2016 Document Revised: 09/03/2016 Document Reviewed: 10/16/2015 Elsevier Interactive Patient Education  2017 [place] Prevention in the Home Falls can cause injuries. They can happen to people of all ages. There are many things you can do to make your home safe and to help prevent falls. What can I do on the outside of my home?  Regularly fix the edges of walkways and driveways and fix any cracks.  Remove anything that might make you trip as you walk through a door, such as a raised step or  threshold.  Trim any bushes or trees on the path to your home.  Use bright outdoor lighting.  Clear any walking paths of anything that might make someone trip, such as rocks or tools.  Regularly check to see if handrails are loose or broken. Make sure that both sides of any steps have handrails.  Any raised decks and porches should have guardrails on the edges.  Have any leaves, snow, or ice cleared regularly.  Use sand or salt on walking paths during winter.  Clean up any spills in your garage right away. This includes oil or grease spills. What can I do in the bathroom?  Use night lights.  Install grab bars by the toilet and in the tub and shower. Do not use towel bars as grab bars.  Use non-skid mats or decals in the tub or shower.  If you need to sit down in the shower, use a plastic, non-slip stool.  Keep the floor dry. Clean up any water that spills on the floor as soon as it happens.  Remove soap buildup in the tub or shower regularly.  Attach bath mats securely with double-sided non-slip rug tape.  Do not have throw rugs and other things on the floor that can make you trip. What can I do in the bedroom?  Use night lights.  Make sure that you have a light by your bed that is easy to reach.  Do not use any sheets or blankets that  are too big for your bed. They should not hang down onto the floor.  Have a firm chair that has side arms. You can use this for support while you get dressed.  Do not have throw rugs and other things on the floor that can make you trip. What can I do in the kitchen?  Clean up any spills right away.  Avoid walking on wet floors.  Keep items that you use a lot in easy-to-reach places.  If you need to reach something above you, use a strong step stool that has a grab bar.  Keep electrical cords out of the way.  Do not use floor polish or wax that makes floors slippery. If you must use wax, use non-skid floor wax.  Do not have  throw rugs and other things on the floor that can make you trip. What can I do with my stairs?  Do not leave any items on the stairs.  Make sure that there are handrails on both sides of the stairs and use them. Fix handrails that are broken or loose. Make sure that handrails are as long as the stairways.  Check any carpeting to make sure that it is firmly attached to the stairs. Fix any carpet that is loose or worn.  Avoid having throw rugs at the top or bottom of the stairs. If you do have throw rugs, attach them to the floor with carpet tape.  Make sure that you have a light switch at the top of the stairs and the bottom of the stairs. If you do not have them, ask someone to add them for you. What else can I do to help prevent falls?  Wear shoes that:  Do not have high heels.  Have rubber bottoms.  Are comfortable and fit you well.  Are closed at the toe. Do not wear sandals.  If you use a stepladder:  Make sure that it is fully opened. Do not climb a closed stepladder.  Make sure that both sides of the stepladder are locked into place.  Ask someone to hold it for you, if possible.  Clearly mark and make sure that you can see:  Any grab bars or handrails.  First and last steps.  Where the edge of each step is.  Use tools that help you move around (mobility aids) if they are needed. These include:  Canes.  Walkers.  Scooters.  Crutches.  Turn on the lights when you go into a dark area. Replace any light bulbs as soon as they burn out.  Set up your furniture so you have a clear path. Avoid moving your furniture around.  If any of your floors are uneven, fix them.  If there are any pets around you, be aware of where they are.  Review your medicines with your doctor. Some medicines can make you feel dizzy. This can increase your chance of falling. Ask your doctor what other things that you can do to help prevent falls. This information is not intended to  replace advice given to you by your health care provider. Make sure you discuss any questions you have with your health care provider. Document Released: 10/11/2009 Document Revised: 05/22/2016 Document Reviewed: 01/19/2015 Elsevier Interactive Patient Education  2017 [employer].

## 2021-06-13 IMAGING — US ULTRASOUND ABDOMEN LIMITED
1 series · 14 of 25 positions shown · non-contrast
Comparison: None.

CLINICAL DATA: Elevated LFTs

EXAM:
ULTRASOUND ABDOMEN LIMITED RIGHT UPPER QUADRANT

[Series 1: ultrasound abdomen limited · 0.17mm/px · 14 of 35 slices shown]
[im 1/35]
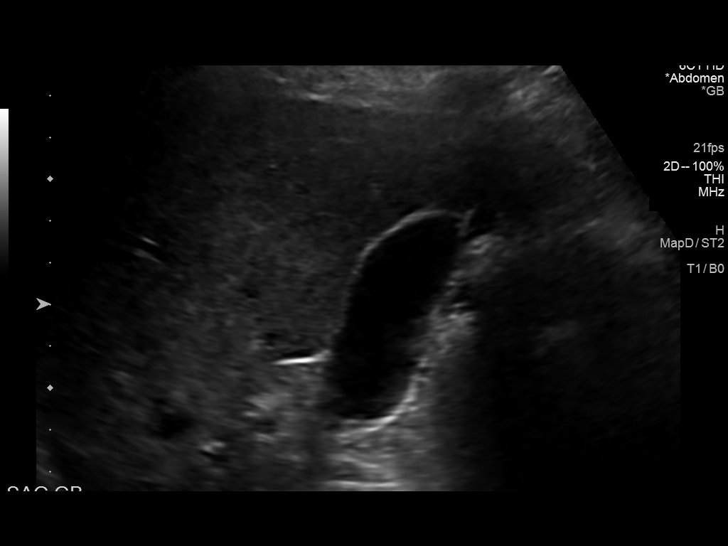
[im 3/35]
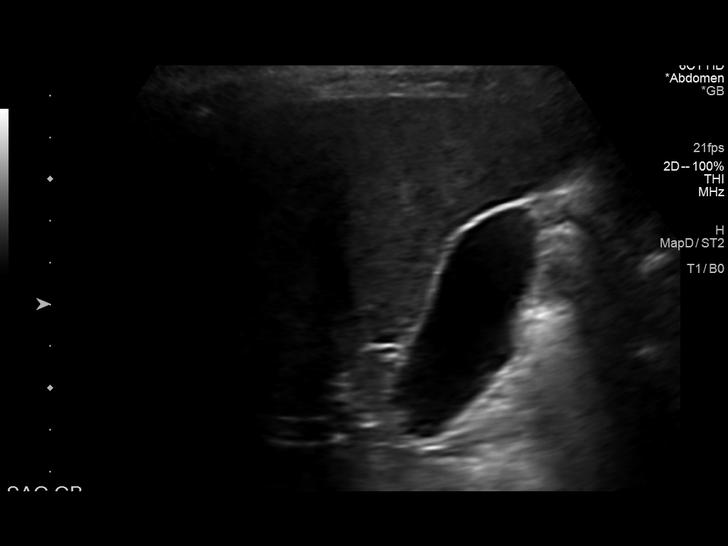
[im 6/35]
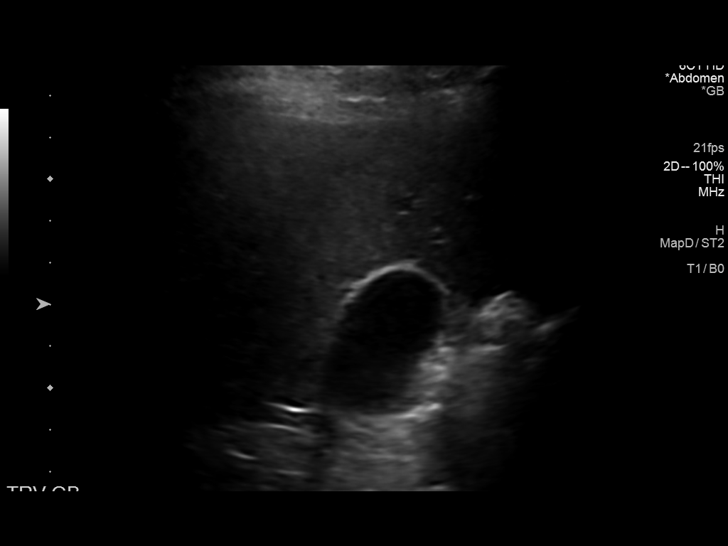
[im 9/35]
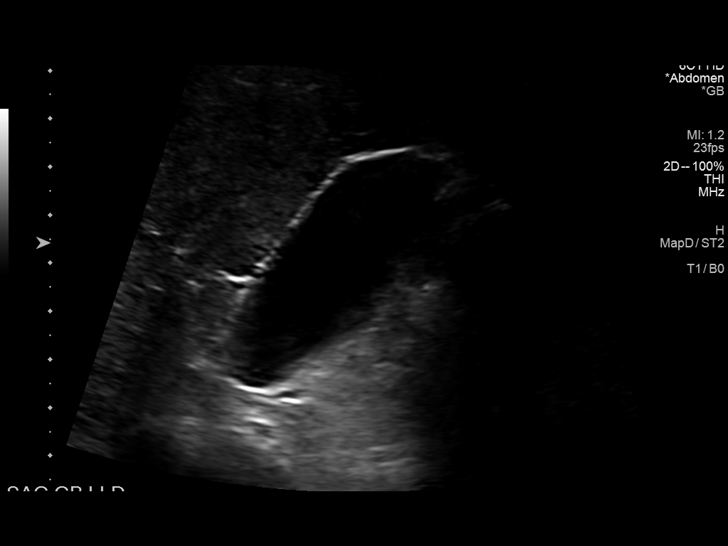
[im 12/35]
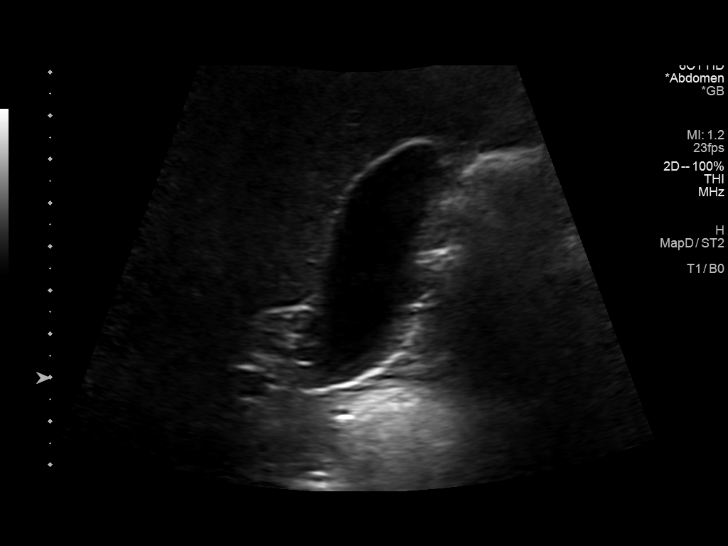
[im 13/35]
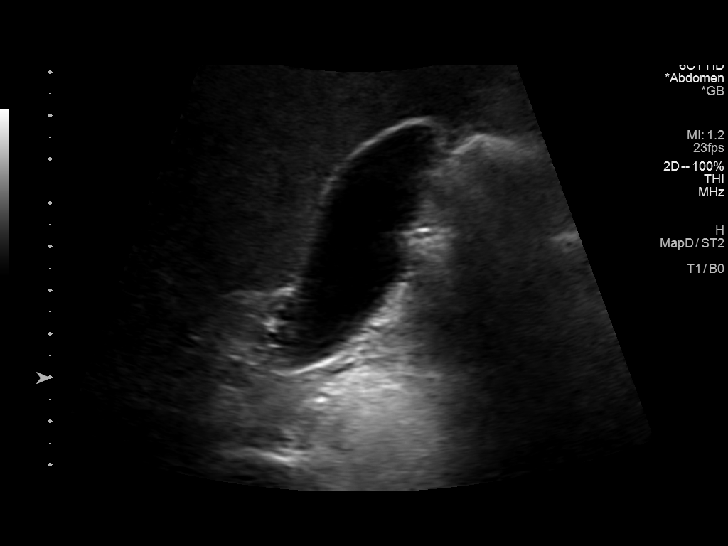
[im 16/35]
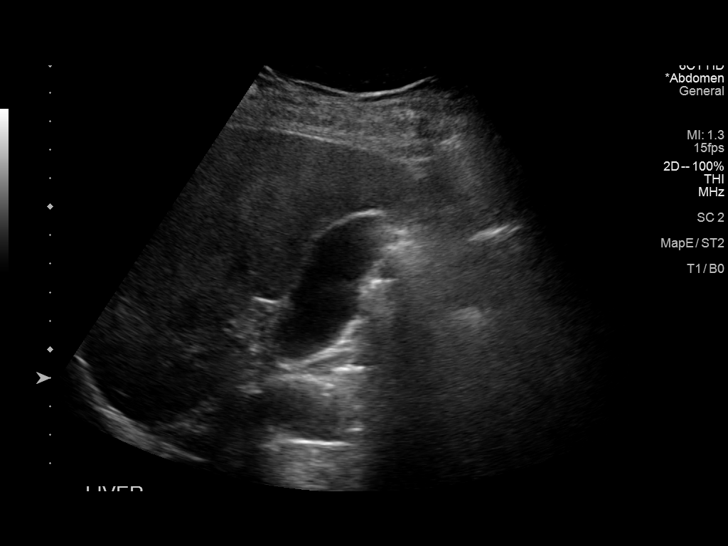
[im 19/35]
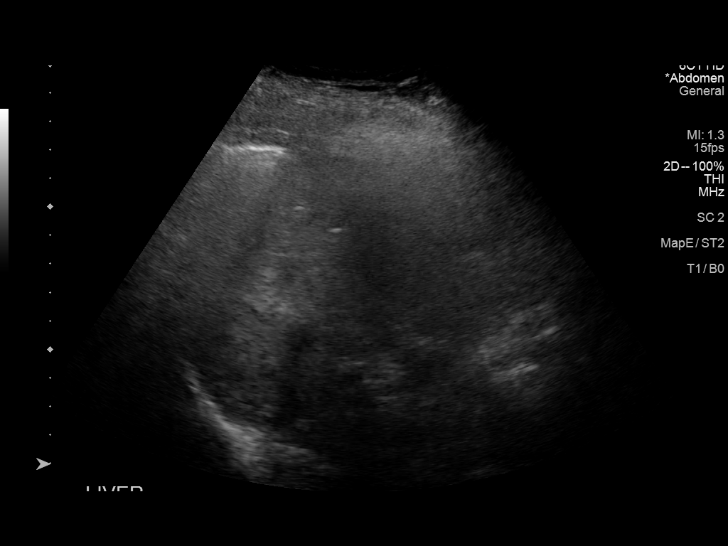
[im 22/35]
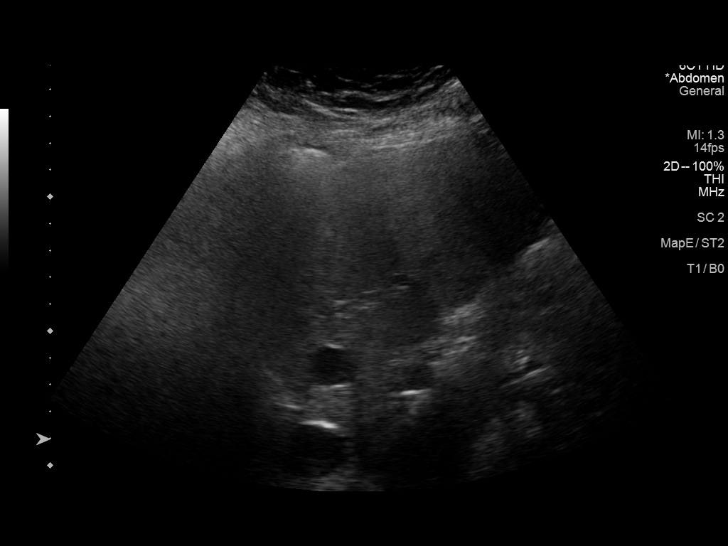
[im 23/35]
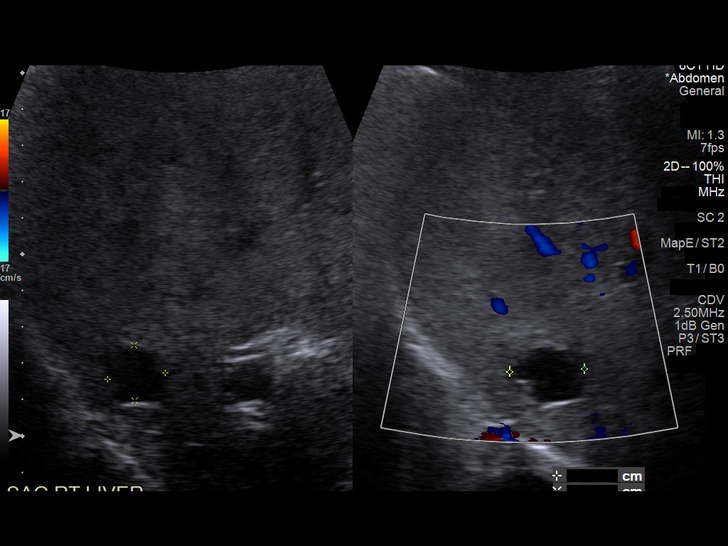
[im 26/35]
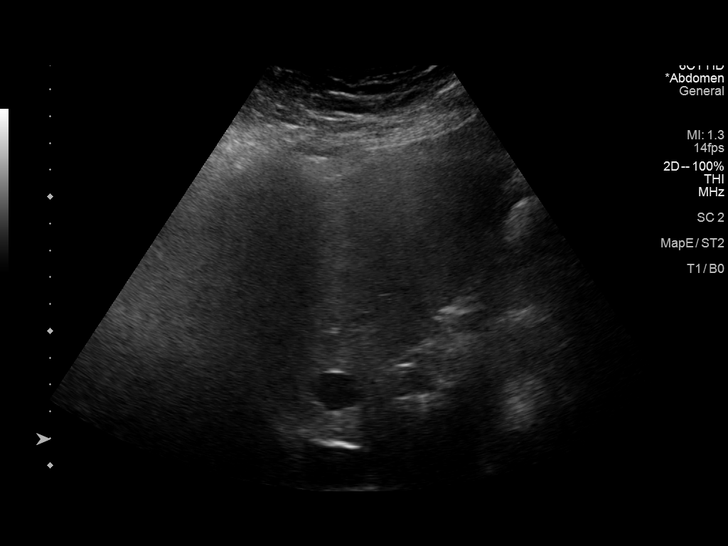
[im 29/35]
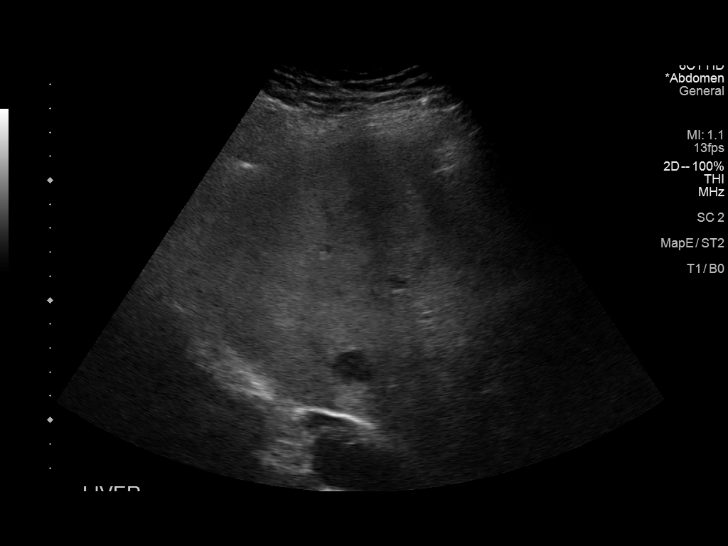
[im 32/35]
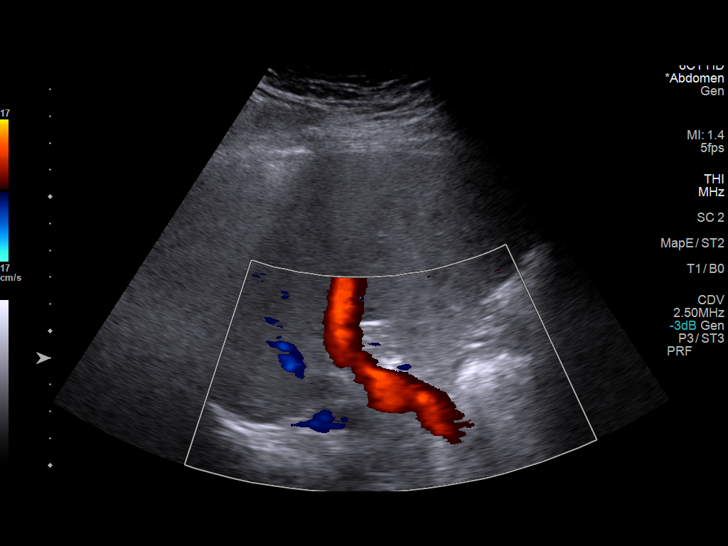
[im 35/35]
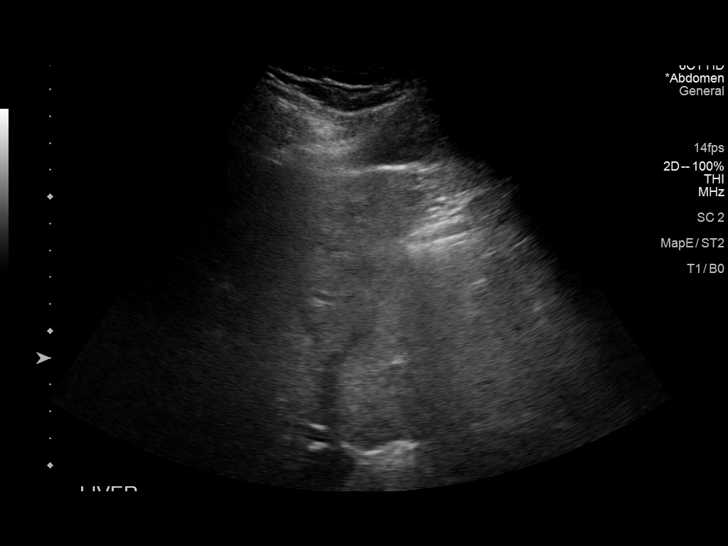

[14 of 25 positions shown; findings below may reference images not displayed]

FINDINGS: Gallbladder:

No gallstones or wall thickening visualized. No sonographic Murphy
sign noted by sonographer.

Common bile duct:

Diameter: 5 mm

Liver:

No solid lesion identified. Simple cyst measuring 2.1 cm of the
right lobe. Heterogeneous echotexture. Portal vein is patent on
color Doppler imaging with normal direction of blood flow towards
the liver.
IMPRESSION: Heterogeneous hepatic echotexture suggestive of chronic parenchymal
liver disease. No overt stigmata of cirrhosis. No other findings to
explain elevated LFTs.

## 2021-07-03 DIAGNOSIS — H353221 Exudative age-related macular degeneration, left eye, with active choroidal neovascularization: Secondary | ICD-10-CM | POA: Diagnosis not present

## 2021-08-01 ENCOUNTER — Other Ambulatory Visit: Payer: Self-pay | Admitting: Internal Medicine

## 2021-08-01 DIAGNOSIS — E785 Hyperlipidemia, unspecified: Secondary | ICD-10-CM

## 2021-08-01 DIAGNOSIS — K7581 Nonalcoholic steatohepatitis (NASH): Secondary | ICD-10-CM

## 2021-08-14 ENCOUNTER — Telehealth: Payer: Self-pay | Admitting: Pharmacist

## 2021-08-14 NOTE — Progress Notes (Signed)
    Chronic Care Management Pharmacy Assistant   Name: Corey [name]  MRN: [id number] DOB: December 20, 1951   Reason for Encounter: Disease State   Conditions to be addressed/monitored: HTN   Recent office visits:  None ID  Recent consult visits:  None ID  Hospital visits:  None in previous 6 months  Medications: Outpatient Encounter Medications as of 08/14/2021  Medication Sig   amLODipine (NORVASC) 10 MG tablet TAKE 1 TABLET BY MOUTH EVERY DAY   atorvastatin (LIPITOR) 10 MG tablet TAKE 1 TABLET BY MOUTH EVERY DAY   benazepril (LOTENSIN) 20 MG tablet TAKE 1 TABLET BY MOUTH EVERY DAY   Multiple Vitamin (MULTIVITAMIN WITH MINERALS) TABS tablet Take 1 tablet by mouth daily.   pioglitazone (ACTOS) 15 MG tablet TAKE 1 TABLET BY MOUTH EVERY DAY   No facility-administered encounter medications on file as of 08/14/2021.    Recent Office Vitals: BP Readings from Last 3 Encounters:  06/03/21 118/60  10/22/20 122/74  04/09/20 122/80   Pulse Readings from Last 3 Encounters:  06/03/21 77  10/22/20 63  04/09/20 73    Wt Readings from Last 3 Encounters:  06/03/21 208 lb 9.6 oz (94.6 kg)  10/22/20 189 lb (85.7 kg)  04/09/20 203 lb (92.1 kg)     Kidney Function Lab Results  Component Value Date/Time   CREATININE 0.93 10/22/2020 09:23 AM   CREATININE 1.13 05/10/2019 04:16 PM   GFR 83.95 10/22/2020 09:23 AM   GFRNONAA >60 03/13/2017 06:25 AM   GFRAA >60 03/13/2017 06:25 AM    BMP Latest Ref Rng & Units 10/22/2020 05/10/2019 02/10/2018  Glucose 70 - 99 mg/dL 99 88 92  BUN 6 - 23 mg/dL 17 22 20   Creatinine 0.40 - 1.50 mg/dL 0.93 1.13 1.00  Sodium 135 - 145 mEq/L 139 138 138  Potassium 3.5 - 5.1 mEq/L 3.8 3.8 3.5  Chloride 96 - 112 mEq/L 102 104 101  CO2 19 - 32 mEq/L 29 26 31   Calcium 8.4 - 10.5 mg/dL 9.3 8.7 9.2     Contacted patient on 08/14/21 to discuss hypertension disease state Contacted patient on 08/21/21 to discuss hypertension disease state Contacted patient on  08/23/21 to discuss hypertension disease state Contacted patient on 08/26/21 to discuss hypertension disease state  Made 4 attempts to call patient for adherence call, left messages for patient to return call. Unable to reach patient.  Current antihypertensive regimen:  amlodipine 10 mg daily benazepril 20 mg daily    What recent interventions/DTPs have been made by any provider to improve Blood Pressure control since last CPP Visit: none noted  Any recent hospitalizations or ED visits since last visit with CPP? No   Adherence Review: Is the patient currently on ACE/ARB medication? Yes Does the patient have >5 day gap between last estimated fill dates? No   Star Rating Drugs:  Medication:  Last Fill: Day Supply Benazepril 20 mg  06/30/21  90 Atorvastatin 10 mg 08/01/21  90 Pioglitazone 15 mg 08/01/21  90  Care Gaps: Last annual wellness visit:10/18/20   CCM appointment on 03/05/22    Ethelene Hal Clinical Pharmacist Assistant [phone]   Time spent:28

## 2021-08-20 DIAGNOSIS — H43812 Vitreous degeneration, left eye: Secondary | ICD-10-CM | POA: Diagnosis not present

## 2021-08-20 DIAGNOSIS — H353221 Exudative age-related macular degeneration, left eye, with active choroidal neovascularization: Secondary | ICD-10-CM | POA: Diagnosis not present

## 2021-09-18 ENCOUNTER — Telehealth: Payer: Self-pay | Admitting: Pharmacist

## 2021-09-18 NOTE — Progress Notes (Addendum)
    Chronic Care Management Pharmacy Assistant   Name: Corey [name]  MRN: [id number] DOB: Jul 03, 1951  Reason for Encounter: Disease State   Conditions to be addressed/monitored: General   Recent office visits:  None ID  Recent consult visits:  None ID  Hospital visits:  None in previous 6 months  Medications: Outpatient Encounter Medications as of 09/18/2021  Medication Sig   amLODipine (NORVASC) 10 MG tablet TAKE 1 TABLET BY MOUTH EVERY DAY   atorvastatin (LIPITOR) 10 MG tablet TAKE 1 TABLET BY MOUTH EVERY DAY   benazepril (LOTENSIN) 20 MG tablet TAKE 1 TABLET BY MOUTH EVERY DAY   Multiple Vitamin (MULTIVITAMIN WITH MINERALS) TABS tablet Take 1 tablet by mouth daily.   pioglitazone (ACTOS) 15 MG tablet TAKE 1 TABLET BY MOUTH EVERY DAY   No facility-administered encounter medications on file as of 09/18/2021.    Contacted [name] on 09/18/21, 09/24/21 and 09/25/21 for general disease state and medication adherence call. Unable to reach patient, left voicemails to return call.  Patient is not > 5 days past due for refill on the following medications per chart history:  Star Medications: Medication Name/mg Last Fill Days Supply Atorvastatin 10 mg  08/21/21 90 Benazepril 20 mg  06/30/21  90 Pioglitazone 15 mg   08/21/21 90   Since last visit with CPP, no interventions have been made:   The patient has not had an ED visit since last contact.    Care Gaps: Annual wellness visit in last year? Yes Most Recent BP reading:   No appointments scheduled within the next 30 days.   [place] Pharmacist Assistant [phone]   Time spent:27

## 2021-09-26 ENCOUNTER — Other Ambulatory Visit: Payer: Self-pay | Admitting: Internal Medicine

## 2021-09-26 DIAGNOSIS — I10 Essential (primary) hypertension: Secondary | ICD-10-CM

## 2021-10-01 DIAGNOSIS — H353112 Nonexudative age-related macular degeneration, right eye, intermediate dry stage: Secondary | ICD-10-CM | POA: Diagnosis not present

## 2021-10-01 DIAGNOSIS — H353221 Exudative age-related macular degeneration, left eye, with active choroidal neovascularization: Secondary | ICD-10-CM | POA: Diagnosis not present

## 2021-10-07 ENCOUNTER — Telehealth: Payer: Self-pay

## 2021-10-07 NOTE — Progress Notes (Signed)
    Chronic Care Management Pharmacy Assistant   Name: Corey [name]  MRN: [id number] DOB: December 24, 1951   Reason for Encounter: Disease State   Conditions to be addressed/monitored: General   Recent office visits:  None ID  Recent consult visits:  None ID  Hospital visits:  None in previous 6 months  Medications: Outpatient Encounter Medications as of 10/07/2021  Medication Sig   amLODipine (NORVASC) 10 MG tablet TAKE 1 TABLET BY MOUTH EVERY DAY   atorvastatin (LIPITOR) 10 MG tablet TAKE 1 TABLET BY MOUTH EVERY DAY   benazepril (LOTENSIN) 20 MG tablet TAKE 1 TABLET BY MOUTH EVERY DAY   Multiple Vitamin (MULTIVITAMIN WITH MINERALS) TABS tablet Take 1 tablet by mouth daily.   pioglitazone (ACTOS) 15 MG tablet TAKE 1 TABLET BY MOUTH EVERY DAY   No facility-administered encounter medications on file as of 10/07/2021.   Have you had any problems recently with your health? Patient is not having any new health issues  Have you had any problems with your pharmacy? Patient states he does not have any problems with getting medications or the cost of medications from the pharmacy  What issues or side effects are you having with your medications? Patient states he has not side effects from medications  What would you like me to pass along to [facility] for them to help you with? Patient states the has not concerns with health or medications  What can we do to take care of you better?Patient states nothing at this time  Care Gaps Colonoscopy-03/11/16 Diabetic Foot Exam-NA Mammogram-NA Ophthalmology-NA Dexa Scan -NA  Annual Well Visit - 10/22/20 Micro albumin-NA Hemoglobin A1c- NA  Star Rating Drugs: Benazepril 20 mg        06/30/21              90 Atorvastatin 10 mg      08/01/21              90 Pioglitazone 15 mg     08/01/21              [place] Clinical Pharmacist Assistant [phone]

## 2021-10-13 ENCOUNTER — Other Ambulatory Visit: Payer: Self-pay | Admitting: Internal Medicine

## 2021-10-13 DIAGNOSIS — I10 Essential (primary) hypertension: Secondary | ICD-10-CM

## 2021-10-22 ENCOUNTER — Ambulatory Visit (INDEPENDENT_AMBULATORY_CARE_PROVIDER_SITE_OTHER): Payer: Medicare HMO | Admitting: Internal Medicine

## 2021-10-22 ENCOUNTER — Encounter: Payer: Self-pay | Admitting: Internal Medicine

## 2021-10-22 ENCOUNTER — Other Ambulatory Visit: Payer: Self-pay

## 2021-10-22 VITALS — BP 128/84 | HR 74 | Temp 98.1°F | Resp 16 | Ht 70.0 in | Wt 203.0 lb

## 2021-10-22 DIAGNOSIS — I10 Essential (primary) hypertension: Secondary | ICD-10-CM

## 2021-10-22 DIAGNOSIS — K439 Ventral hernia without obstruction or gangrene: Secondary | ICD-10-CM

## 2021-10-22 DIAGNOSIS — Z0000 Encounter for general adult medical examination without abnormal findings: Secondary | ICD-10-CM

## 2021-10-22 DIAGNOSIS — C61 Malignant neoplasm of prostate: Secondary | ICD-10-CM | POA: Diagnosis not present

## 2021-10-22 DIAGNOSIS — E785 Hyperlipidemia, unspecified: Secondary | ICD-10-CM

## 2021-10-22 LAB — BASIC METABOLIC PANEL
BUN: 20 mg/dL (ref 6–23)
CO2: 29 mEq/L (ref 19–32)
Calcium: 9.1 mg/dL (ref 8.4–10.5)
Chloride: 104 mEq/L (ref 96–112)
Creatinine, Ser: 0.84 mg/dL (ref 0.40–1.50)
GFR: 88.53 mL/min (ref >60.00)
Glucose, Bld: 86 mg/dL (ref 70–99)
Potassium: 4.5 mEq/L (ref 3.5–5.1)
Sodium: 138 mEq/L (ref 135–145)

## 2021-10-22 LAB — LIPID PANEL
Cholesterol: 135 mg/dL (ref 0–200)
HDL: 59.9 mg/dL (ref >39.00)
LDL Cholesterol: 63 mg/dL (ref 0–99)
NonHDL: 75.29
Total CHOL/HDL Ratio: 2
Triglycerides: 59 mg/dL (ref 0.0–149.0)
VLDL: 11.8 mg/dL (ref 0.0–40.0)

## 2021-10-22 LAB — HEPATIC FUNCTION PANEL
ALT: 31 U/L (ref 0–53)
AST: 73 U/L — ABNORMAL HIGH (ref 0–37)
Albumin: 4.2 g/dL (ref 3.5–5.2)
Alkaline Phosphatase: 74 U/L (ref 39–117)
Bilirubin, Direct: 0.1 mg/dL (ref 0.0–0.3)
Total Bilirubin: 0.5 mg/dL (ref 0.2–1.2)
Total Protein: 6.8 g/dL (ref 6.0–8.3)

## 2021-10-22 LAB — TSH: TSH: 2.03 u[IU]/mL (ref 0.35–5.50)

## 2021-10-22 LAB — CBC WITH DIFFERENTIAL/PLATELET
Basophils Absolute: 0 10*3/uL (ref 0.0–0.1)
Basophils Relative: 0.3 % (ref 0.0–3.0)
Eosinophils Absolute: 0.1 10*3/uL (ref 0.0–0.7)
Eosinophils Relative: 1.2 % (ref 0.0–5.0)
HCT: 41.6 % (ref 39.0–52.0)
Hemoglobin: 13.8 g/dL (ref 13.0–17.0)
Lymphocytes Relative: 13.1 % (ref 12.0–46.0)
Lymphs Abs: 0.8 10*3/uL (ref 0.7–4.0)
MCHC: 33.1 g/dL (ref 30.0–36.0)
MCV: 88.5 fl (ref 78.0–100.0)
Monocytes Absolute: 0.5 10*3/uL (ref 0.1–1.0)
Monocytes Relative: 8.7 % (ref 3.0–12.0)
Neutro Abs: 4.6 10*3/uL (ref 1.4–7.7)
Neutrophils Relative %: 76.7 % (ref 43.0–77.0)
Platelets: 171 10*3/uL (ref 150.0–400.0)
RBC: 4.7 Mil/uL (ref 4.22–5.81)
RDW: 13.8 % (ref 11.5–15.5)
WBC: 6.1 10*3/uL (ref 4.0–10.5)

## 2021-10-22 LAB — PSA: PSA: 0.08 ng/mL — ABNORMAL LOW (ref 0.10–4.00)

## 2021-10-22 NOTE — Progress Notes (Signed)
Subjective:  Patient ID: Corey [name], male    DOB: [birth year]  Age: 70 y.o. MRN: [id number]  CC: Annual Exam, Hypertension, and Hyperlipidemia  This visit occurred during the SARS-CoV-2 public health emergency.  Safety protocols were in place, including screening questions prior to the visit, additional usage of staff PPE, and extensive cleaning of exam room while observing appropriate contact time as indicated for disinfecting solutions.    HPI [name] presents for a CPX and f/up -   He tells me his blood pressure has been well controlled.  He is active and denies chest pain, shortness of breath, palpitations, diaphoresis, dizziness, lightheadedness, or edema.   Outpatient Medications Prior to Visit  Medication Sig Dispense Refill   atorvastatin (LIPITOR) 10 MG tablet TAKE 1 TABLET BY MOUTH EVERY DAY 90 tablet 1   Multiple Vitamin (MULTIVITAMIN WITH MINERALS) TABS tablet Take 1 tablet by mouth daily.     pioglitazone (ACTOS) 15 MG tablet TAKE 1 TABLET BY MOUTH EVERY DAY 90 tablet 1   amLODipine (NORVASC) 10 MG tablet TAKE 1 TABLET BY MOUTH EVERY DAY 90 tablet 1   benazepril (LOTENSIN) 20 MG tablet TAKE 1 TABLET BY MOUTH EVERY DAY 90 tablet 1   No facility-administered medications prior to visit.    ROS Review of Systems  Constitutional:  Negative for diaphoresis, fatigue and unexpected weight change.  HENT: Negative.    Eyes: Negative.   Respiratory:  Negative for cough, chest tightness, shortness of breath and wheezing.   Cardiovascular:  Negative for chest pain, palpitations and leg swelling.  Gastrointestinal:  Negative for abdominal pain, constipation, diarrhea, nausea and vomiting.  Endocrine: Negative.   Genitourinary: Negative.  Negative for difficulty urinating, dysuria and hematuria.  Musculoskeletal:  Negative for arthralgias and myalgias.  Skin: Negative.  Negative for color change.  Neurological:  Negative for dizziness, weakness, light-headedness and  headaches.  Hematological:  Negative for adenopathy. Does not bruise/bleed easily.  Psychiatric/Behavioral: Negative.     Objective:  BP 128/84 (BP Location: Left Arm, Patient Position: Sitting, Cuff Size: Large)   Pulse 74   Temp 98.1 F (36.7 C) (Oral)   Resp 16   Ht 5' 10"  (1.778 m)   Wt 203 lb (92.1 kg)   SpO2 96%   BMI 29.13 kg/m   BP Readings from Last 3 Encounters:  10/22/21 128/84  06/03/21 118/60  10/22/20 122/74    Wt Readings from Last 3 Encounters:  10/22/21 203 lb (92.1 kg)  06/03/21 208 lb 9.6 oz (94.6 kg)  10/22/20 189 lb (85.7 kg)    Physical Exam Vitals reviewed.  Constitutional:      Appearance: Normal appearance.  HENT:     Nose: Nose normal.     Mouth/Throat:     Mouth: Mucous membranes are moist.  Eyes:     General: No scleral icterus.    Conjunctiva/sclera: Conjunctivae normal.  Cardiovascular:     Rate and Rhythm: Normal rate and regular rhythm.     Heart sounds: No murmur heard. Pulmonary:     Effort: Pulmonary effort is normal.     Breath sounds: No stridor. No wheezing, rhonchi or rales.  Abdominal:     General: Abdomen is flat.     Palpations: There is no mass.     Tenderness: There is no abdominal tenderness. There is no guarding or rebound.     Hernia: No hernia is present.  Musculoskeletal:        General: Normal range of motion.  Cervical back: Neck supple.     Right lower leg: No edema.     Left lower leg: No edema.  Lymphadenopathy:     Cervical: No cervical adenopathy.  Skin:    General: Skin is warm and dry.  Neurological:     General: No focal deficit present.     Mental Status: He is alert.  Psychiatric:        Mood and Affect: Mood normal.        Behavior: Behavior normal.    Lab Results  Component Value Date   WBC 6.1 10/22/2021   HGB 13.8 10/22/2021   HCT 41.6 10/22/2021   PLT 171.0 10/22/2021   GLUCOSE 86 10/22/2021   CHOL 135 10/22/2021   TRIG 59.0 10/22/2021   HDL 59.90 10/22/2021   LDLCALC 63  10/22/2021   ALT 31 10/22/2021   AST 73 (H) 10/22/2021   NA 138 10/22/2021   K 4.5 10/22/2021   CL 104 10/22/2021   CREATININE 0.84 10/22/2021   BUN 20 10/22/2021   CO2 29 10/22/2021   TSH 2.03 10/22/2021   PSA 0.08 (L) 10/22/2021   INR 1.0 10/22/2020   HGBA1C 5.6 09/17/2020    US Abdomen Limited RUQ  Result Date: 06/13/2019 CLINICAL DATA:  Elevated LFTs EXAM: ULTRASOUND ABDOMEN LIMITED RIGHT UPPER QUADRANT COMPARISON:  None. FINDINGS: Gallbladder: No gallstones or wall thickening visualized. No sonographic Murphy sign noted by sonographer. Common bile duct: Diameter: 5 mm Liver: No solid lesion identified. Simple cyst measuring 2.1 cm of the right lobe. Heterogeneous echotexture. Portal vein is patent on color Doppler imaging with normal direction of blood flow towards the liver. IMPRESSION: Heterogeneous hepatic echotexture suggestive of chronic parenchymal liver disease. No overt stigmata of cirrhosis. No other findings to explain elevated LFTs. Electronically Signed   By: Eddie Candle M.D.   On: 06/13/2019 08:33    Assessment & Plan:   [name] was seen today for annual exam, hypertension and hyperlipidemia.  Diagnoses and all orders for this visit:  Prostate cancer ([place])- There is no evidence of recurrence. -     PSA; Future -     PSA  Essential hypertension, benign- His BP is well controlled. -     CBC with Differential/Platelet; Future -     Basic metabolic panel; Future -     Hepatic function panel; Future -     TSH; Future -     TSH -     Hepatic function panel -     Basic metabolic panel -     CBC with Differential/Platelet -     benazepril (LOTENSIN) 20 MG tablet; Take 1 tablet (20 mg total) by mouth daily. -     amLODipine (NORVASC) 10 MG tablet; Take 1 tablet (10 mg total) by mouth daily.  Hyperlipidemia with target LDL less than 130- LDL goal achieved. Doing well on the statin  -     Lipid panel; Future -     Hepatic function panel; Future -     Hepatic  function panel -     Lipid panel  Ventral hernia without obstruction or gangrene  Routine general medical examination at a health care facility- Exam completed, labs reviewed, vaccines reviewed, cancer screenings are UTD, pt ed material was given.  I have changed Raney Gilreath's benazepril and amLODipine. I am also having him maintain his multivitamin with minerals, atorvastatin, and pioglitazone.  Meds ordered this encounter  Medications   benazepril (LOTENSIN) 20 MG tablet  Sig: Take 1 tablet (20 mg total) by mouth daily.    Dispense:  90 tablet    Refill:  1   amLODipine (NORVASC) 10 MG tablet    Sig: Take 1 tablet (10 mg total) by mouth daily.    Dispense:  90 tablet    Refill:  1      Follow-up: Return in about 6 months (around 04/22/2022).  Scarlette Calico, MD

## 2021-10-22 NOTE — Patient Instructions (Signed)
Health Maintenance, Male Adopting a healthy lifestyle and getting preventive care are important in promoting health and wellness. Ask your health care provider about: The right schedule for you to have regular tests and exams. Things you can do on your own to prevent diseases and keep yourself healthy. What should I know about diet, weight, and exercise? Eat a healthy diet  Eat a diet that includes plenty of vegetables, fruits, low-fat dairy products, and lean protein. Do not eat a lot of foods that are high in solid fats, added sugars, or sodium. Maintain a healthy weight Body mass index (BMI) is a measurement that can be used to identify possible weight problems. It estimates body fat based on height and weight. Your health care provider can help determine your BMI and help you achieve or maintain a healthy weight. Get regular exercise Get regular exercise. This is one of the most important things you can do for your health. Most adults should: Exercise for at least 150 minutes each week. The exercise should increase your heart rate and make you sweat (moderate-intensity exercise). Do strengthening exercises at least twice a week. This is in addition to the moderate-intensity exercise. Spend less time sitting. Even light physical activity can be beneficial. Watch cholesterol and blood lipids Have your blood tested for lipids and cholesterol at 70 years of age, then have this test every 5 years. You may need to have your cholesterol levels checked more often if: Your lipid or cholesterol levels are high. You are older than 70 years of age. You are at high risk for heart disease. What should I know about cancer screening? Many types of cancers can be detected early and may often be prevented. Depending on your health history and family history, you may need to have cancer screening at various ages. This may include screening for: Colorectal cancer. Prostate cancer. Skin cancer. Lung  cancer. What should I know about heart disease, diabetes, and high blood pressure? Blood pressure and heart disease High blood pressure causes heart disease and increases the risk of stroke. This is more likely to develop in people who have high blood pressure readings, are of African descent, or are overweight. Talk with your health care provider about your target blood pressure readings. Have your blood pressure checked: Every 3-5 years if you are 18-39 years of age. Every year if you are 40 years old or older. If you are between the ages of 65 and 75 and are a current or former smoker, ask your health care provider if you should have a one-time screening for abdominal aortic aneurysm (AAA). Diabetes Have regular diabetes screenings. This checks your fasting blood sugar level. Have the screening done: Once every three years after age 45 if you are at a normal weight and have a low risk for diabetes. More often and at a younger age if you are overweight or have a high risk for diabetes. What should I know about preventing infection? Hepatitis B If you have a higher risk for hepatitis B, you should be screened for this virus. Talk with your health care provider to find out if you are at risk for hepatitis B infection. Hepatitis C Blood testing is recommended for: Everyone born from 1945 through 1965. Anyone with known risk factors for hepatitis C. Sexually transmitted infections (STIs) You should be screened each year for STIs, including gonorrhea and chlamydia, if: You are sexually active and are younger than 70 years of age. You are older than 70 years   of age and your health care provider tells you that you are at risk for this type of infection. Your sexual activity has changed since you were last screened, and you are at increased risk for chlamydia or gonorrhea. Ask your health care provider if you are at risk. Ask your health care provider about whether you are at high risk for HIV.  Your health care provider may recommend a prescription medicine to help prevent HIV infection. If you choose to take medicine to prevent HIV, you should first get tested for HIV. You should then be tested every 3 months for as long as you are taking the medicine. Follow these instructions at home: Lifestyle Do not use any products that contain nicotine or tobacco, such as cigarettes, e-cigarettes, and chewing tobacco. If you need help quitting, ask your health care provider. Do not use street drugs. Do not share needles. Ask your health care provider for help if you need support or information about quitting drugs. Alcohol use Do not drink alcohol if your health care provider tells you not to drink. If you drink alcohol: Limit how much you have to 0-2 drinks a day. Be aware of how much alcohol is in your drink. In the [place] one drink equals one 12 oz bottle of beer (355 mL), one 5 oz glass of wine (148 mL), or one 1 oz glass of hard liquor (44 mL). General instructions Schedule regular health, dental, and eye exams. Stay current with your vaccines. Tell your health care provider if: You often feel depressed. You have ever been abused or do not feel safe at home. Summary Adopting a healthy lifestyle and getting preventive care are important in promoting health and wellness. Follow your health care provider's instructions about healthy diet, exercising, and getting tested or screened for diseases. Follow your health care provider's instructions on monitoring your cholesterol and blood pressure. This information is not intended to replace advice given to you by your health care provider. Make sure you discuss any questions you have with your health care provider. Document Revised: 02/22/2021 Document Reviewed: 12/08/2018 Elsevier Patient Education  2022 [employer]  

## 2021-10-23 MED ORDER — AMLODIPINE BESYLATE 10 MG PO TABS: 10.0000 mg | ORAL_TABLET | Freq: Every day | ORAL | 1 refills | Status: DC

## 2021-10-23 MED ORDER — BENAZEPRIL HCL 20 MG PO TABS: 20.0000 mg | ORAL_TABLET | Freq: Every day | ORAL | 1 refills | Status: DC

## 2021-11-14 DIAGNOSIS — H43812 Vitreous degeneration, left eye: Secondary | ICD-10-CM | POA: Diagnosis not present

## 2021-11-14 DIAGNOSIS — H353112 Nonexudative age-related macular degeneration, right eye, intermediate dry stage: Secondary | ICD-10-CM | POA: Diagnosis not present

## 2021-11-14 DIAGNOSIS — H353221 Exudative age-related macular degeneration, left eye, with active choroidal neovascularization: Secondary | ICD-10-CM | POA: Diagnosis not present

## 2022-01-02 DIAGNOSIS — H353112 Nonexudative age-related macular degeneration, right eye, intermediate dry stage: Secondary | ICD-10-CM | POA: Diagnosis not present

## 2022-01-02 DIAGNOSIS — H353221 Exudative age-related macular degeneration, left eye, with active choroidal neovascularization: Secondary | ICD-10-CM | POA: Diagnosis not present

## 2022-02-13 DIAGNOSIS — H353221 Exudative age-related macular degeneration, left eye, with active choroidal neovascularization: Secondary | ICD-10-CM | POA: Diagnosis not present

## 2022-02-13 DIAGNOSIS — H353112 Nonexudative age-related macular degeneration, right eye, intermediate dry stage: Secondary | ICD-10-CM | POA: Diagnosis not present

## 2022-02-14 ENCOUNTER — Other Ambulatory Visit: Payer: Self-pay | Admitting: Internal Medicine

## 2022-02-14 DIAGNOSIS — E785 Hyperlipidemia, unspecified: Secondary | ICD-10-CM

## 2022-02-14 DIAGNOSIS — K7581 Nonalcoholic steatohepatitis (NASH): Secondary | ICD-10-CM

## 2022-03-05 ENCOUNTER — Telehealth: Payer: Medicare HMO

## 2022-03-10 ENCOUNTER — Telehealth: Payer: Self-pay

## 2022-03-10 NOTE — Progress Notes (Signed)
? ? ?  Chronic Care Management ?Pharmacy Assistant  ? ?Name: [name]  MRN: [id number] DOB: 12-Apr-1951 ? ?Left message for patient that his appointment on March 31,2023 was rescheduled for May 27, 2022 at 9:45 am with clinical pharmacist Linna Hoff. ? ?Ethelene Hal ?Clinical Pharmacist Assistant ?[phone]  ?

## 2022-03-25 DIAGNOSIS — N200 Calculus of kidney: Secondary | ICD-10-CM | POA: Diagnosis not present

## 2022-03-25 DIAGNOSIS — Z8546 Personal history of malignant neoplasm of prostate: Secondary | ICD-10-CM | POA: Diagnosis not present

## 2022-03-25 DIAGNOSIS — R319 Hematuria, unspecified: Secondary | ICD-10-CM | POA: Diagnosis not present

## 2022-03-25 DIAGNOSIS — R3915 Urgency of urination: Secondary | ICD-10-CM | POA: Diagnosis not present

## 2022-03-25 DIAGNOSIS — N281 Cyst of kidney, acquired: Secondary | ICD-10-CM | POA: Diagnosis not present

## 2022-03-25 LAB — CBC AND DIFFERENTIAL
HCT: 41 (ref 41–53)
Hemoglobin: 14.1 (ref 13.5–17.5)
Platelets: 148 10*3/uL — AB (ref 150–400)
WBC: 6.4

## 2022-03-25 LAB — HEPATIC FUNCTION PANEL
ALT: 31 U/L (ref 10–40)
AST: 82 — AB (ref 14–40)
Alkaline Phosphatase: 86 (ref 25–125)
Bilirubin, Total: 0.6

## 2022-03-25 LAB — BASIC METABOLIC PANEL
BUN: 21 (ref 4–21)
CO2: 26 — AB (ref 13–22)
Chloride: 105 (ref 99–108)
Creatinine: 0.9 (ref 0.6–1.3)
Glucose: 111
Potassium: 4.1 mEq/L (ref 3.5–5.1)
Sodium: 138 (ref 137–147)

## 2022-03-25 LAB — COMPREHENSIVE METABOLIC PANEL
Albumin: 4.2 (ref 3.5–5.0)
Calcium: 9.1 (ref 8.7–10.7)

## 2022-03-27 DIAGNOSIS — N393 Stress incontinence (female) (male): Secondary | ICD-10-CM | POA: Diagnosis not present

## 2022-03-27 DIAGNOSIS — N5231 Erectile dysfunction following radical prostatectomy: Secondary | ICD-10-CM | POA: Diagnosis not present

## 2022-03-27 DIAGNOSIS — Z8546 Personal history of malignant neoplasm of prostate: Secondary | ICD-10-CM | POA: Diagnosis not present

## 2022-03-27 DIAGNOSIS — N281 Cyst of kidney, acquired: Secondary | ICD-10-CM | POA: Insufficient documentation

## 2022-03-27 DIAGNOSIS — N3040 Irradiation cystitis without hematuria: Secondary | ICD-10-CM | POA: Diagnosis not present

## 2022-03-28 ENCOUNTER — Telehealth: Payer: Medicare HMO

## 2022-03-28 DIAGNOSIS — H353113 Nonexudative age-related macular degeneration, right eye, advanced atrophic without subfoveal involvement: Secondary | ICD-10-CM | POA: Diagnosis not present

## 2022-03-28 DIAGNOSIS — H353221 Exudative age-related macular degeneration, left eye, with active choroidal neovascularization: Secondary | ICD-10-CM | POA: Diagnosis not present

## 2022-03-28 DIAGNOSIS — H43812 Vitreous degeneration, left eye: Secondary | ICD-10-CM | POA: Diagnosis not present

## 2022-04-09 ENCOUNTER — Encounter: Payer: Self-pay | Admitting: Family Medicine

## 2022-04-09 ENCOUNTER — Ambulatory Visit (INDEPENDENT_AMBULATORY_CARE_PROVIDER_SITE_OTHER): Payer: Medicare HMO | Admitting: Family Medicine

## 2022-04-09 VITALS — BP 124/76 | HR 70 | Temp 97.7°F | Ht 70.0 in | Wt 217.0 lb

## 2022-04-09 DIAGNOSIS — R310 Gross hematuria: Secondary | ICD-10-CM

## 2022-04-09 DIAGNOSIS — R300 Dysuria: Secondary | ICD-10-CM

## 2022-04-09 LAB — POCT URINALYSIS DIPSTICK
Bilirubin, UA: NEGATIVE
Glucose, UA: NEGATIVE
Ketones, UA: NEGATIVE
Nitrite, UA: NEGATIVE
Protein, UA: NEGATIVE
Spec Grav, UA: 1.01 (ref 1.010–1.025)
Urobilinogen, UA: 0.2 E.U./dL
pH, UA: 6.5 (ref 5.0–8.0)

## 2022-04-09 NOTE — Progress Notes (Signed)
? ?Subjective:  ? ? ? Patient ID: Corey [name], male    DOB: 10-Sep-1951, 71 y.o.   MRN: [id number] ? ?Chief Complaint  ?Patient presents with  ? Dysuria  ?  Started 4/8. ?3/28 was at ED for blood clots in his bladder. Monday dysuria stopped and had passed another blood clot. Dysuria started back yesterday accompanied by pinkish urine and a "weak stream", denies dysuria today and stream is normal.  ? ? ?Dysuria  ? ?He is a 71 year old male with a history of prostate cancer who is here today for hematuria and has recently passed some blood clots. Dysuria has been intermittent as well as decreased urine stream.  States today he feels back to his baseline today with good urine flow and no pain.   ?Cystoscopy done on 03/27/2022 for the same. Findings thought to be due to hx of radiation cystitis. He is closely followed by urologist, Dr. Odis Luster.  ? ?Denies fever, chills, dizziness, chest pain, palpitations, shortness of breath, abdominal pain, N/V/D.  ? ? ? ?There are no preventive care reminders to display for this patient. ? ?Past Medical History:  ?Diagnosis Date  ? Arthritis   ? "minor; elbows, wrists" (03/12/2017)  ? BPH (benign prostatic hyperplasia) 2011  ? Hypertension   ? Incisional hernia   ? Prostate cancer ([facility]) 2016  ? S/P prostatectomy, TURP, radiation in 2017  ? ? ?Past Surgical History:  ?Procedure Laterality Date  ? HERNIA REPAIR    ? INCISIONAL HERNIA REPAIR N/A 03/12/2017  ? Procedure: LAPAROSCOPIC INCISIONAL HERNIA REPAIR;  Surgeon: Erroll Luna, MD;  Location: [facility];  Service: General;  Laterality: N/A;  ? INGUINAL HERNIA REPAIR Right ~ 2007  ? INSERTION OF MESH N/A 03/12/2017  ? Procedure: INSERTION OF MESH;  Surgeon: Erroll Luna, MD;  Location: [place];  Service: General;  Laterality: N/A;  ? LAPAROSCOPIC INCISIONAL / UMBILICAL / [place]  03/12/2017  ? IHR w/mesh  ? PROSTATECTOMY  2017  ? TRANSURETHRAL RESECTION OF PROSTATE  2017  ? "after they removed my prostate"  ? UMBILICAL HERNIA  REPAIR  ~ 2012  ? ? ?Family History  ?Problem Relation Age of Onset  ? Cancer Mother   ? Alcohol abuse Sister   ? Early death Sister   ? Stroke Neg Hx   ? Kidney disease Neg Hx   ? Hypertension Neg Hx   ? Heart disease Neg Hx   ? Hyperlipidemia Neg Hx   ? Diabetes Neg Hx   ? Depression Neg Hx   ? COPD Neg Hx   ? Asthma Neg Hx   ? ? ?Social History  ? ?Socioeconomic History  ? Marital status: Married  ?  Spouse name: Not on file  ? Number of children: 3  ? Years of education: Not on file  ? Highest education level: 12th grade  ?Occupational History  ? Not on file  ?Tobacco Use  ? Smoking status: Never  ? Smokeless tobacco: Never  ?Vaping Use  ? Vaping Use: Never used  ?Substance and Sexual Activity  ? Alcohol use: Yes  ?  Alcohol/week: 5.0 standard drinks  ?  Types: 5 Glasses of wine per week  ? Drug use: No  ? Sexual activity: Yes  ?  Partners: Male  ?  Birth control/protection: None  ?Other Topics Concern  ? Not on file  ?Social History Narrative  ? Not on file  ? ?Social Determinants of Health  ? ?Financial Resource Strain: Low Risk   ?  Difficulty of Paying Living Expenses: Not hard at all  ?Food Insecurity: No Food Insecurity  ? Worried About Charity fundraiser in the Last Year: Never true  ? Ran Out of Food in the Last Year: Never true  ?Transportation Needs: No Transportation Needs  ? Lack of Transportation (Medical): No  ? Lack of Transportation (Non-Medical): No  ?Physical Activity: Sufficiently Active  ? Days of Exercise per Week: 5 days  ? Minutes of Exercise per Session: 30 min  ?Stress: No Stress Concern Present  ? Feeling of Stress : Not at all  ?Social Connections: Socially Integrated  ? Frequency of Communication with Friends and Family: More than three times a week  ? Frequency of Social Gatherings with Friends and Family: More than three times a week  ? Attends Religious Services: More than 4 times per year  ? Active Member of Clubs or Organizations: No  ? Attends Archivist Meetings: More  than 4 times per year  ? Marital Status: Married  ?Intimate Partner Violence: Not on file  ? ? ?Outpatient Medications Prior to Visit  ?Medication Sig Dispense Refill  ? amLODipine (NORVASC) 10 MG tablet Take 1 tablet (10 mg total) by mouth daily. 90 tablet 1  ? atorvastatin (LIPITOR) 10 MG tablet TAKE 1 TABLET BY MOUTH EVERY DAY 90 tablet 1  ? benazepril (LOTENSIN) 20 MG tablet Take 1 tablet (20 mg total) by mouth daily. 90 tablet 1  ? Multiple Vitamin (MULTIVITAMIN WITH MINERALS) TABS tablet Take 1 tablet by mouth daily.    ? pioglitazone (ACTOS) 15 MG tablet TAKE 1 TABLET BY MOUTH EVERY DAY 90 tablet 1  ? ?No facility-administered medications prior to visit.  ? ? ?No Known Allergies ? ?Review of Systems  ?Genitourinary:  Positive for dysuria.  ?Pertinent positives and negatives in the history of present illness. ? ?   ?Objective:  ?  ?Physical Exam ?Constitutional:   ?   General: He is not in acute distress. ?   Appearance: Normal appearance. He is not ill-appearing.  ?Cardiovascular:  ?   Rate and Rhythm: Normal rate and regular rhythm.  ?   Pulses: Normal pulses.  ?Pulmonary:  ?   Effort: Pulmonary effort is normal.  ?   Breath sounds: Normal breath sounds.  ?Abdominal:  ?   General: Abdomen is flat. Bowel sounds are normal. There is no distension.  ?   Palpations: Abdomen is soft.  ?   Tenderness: There is no abdominal tenderness. There is no right CVA tenderness, left CVA tenderness, guarding or rebound.  ?Musculoskeletal:  ?   Cervical back: Neck supple.  ?Skin: ?   General: Skin is warm and dry.  ?Neurological:  ?   General: No focal deficit present.  ?   Mental Status: He is alert and oriented to person, place, and time.  ?Psychiatric:     ?   Mood and Affect: Mood normal.  ? ? ?BP 124/76 (BP Location: Left Arm, Patient Position: Sitting, Cuff Size: Large)   Pulse 70   Temp 97.7 ?F (36.5 ?C) (Temporal)   Ht 5' 10"  (1.778 m)   Wt 217 lb (98.4 kg)   SpO2 100%   BMI 31.14 kg/m?  ?Wt Readings from Last 3  Encounters:  ?04/09/22 217 lb (98.4 kg)  ?10/22/21 203 lb (92.1 kg)  ?06/03/21 208 lb 9.6 oz (94.6 kg)  ? ? ? ?   ?Assessment & Plan:  ? ?Problem List Items Addressed This Visit   ?  None ?Visit Diagnoses   ? ? Gross hematuria    -  Primary  ? Dysuria      ? Relevant Orders  ? POCT urinalysis dipstick (Completed)  ? ?  ? ?UA shows blood 2+ and leuk 1+, negative otherwise.  ?Urine culture sent.  ?Benign exam, at baseline per patient.  ?Discussed starting on antibiotic today or waiting until urine culture result and he prefers to wait until culture is back since he is at baseline today. He will call or message [place] if he notices any new or returning symptoms.  ?He will also contact his urologist to follow up with them.  ? ?I am having [name] maintain his multivitamin with minerals, benazepril, amLODipine, pioglitazone, and atorvastatin. ? ?No orders of the defined types were placed in this encounter. ? ? ?

## 2022-04-09 NOTE — Patient Instructions (Signed)
Call if you are having any new or worsening symptoms. We will be in touch with your urine culture results.  ? ?I also recommend reaching out to schedule with your urologist.  ?

## 2022-04-10 LAB — URINE CULTURE

## 2022-04-18 DIAGNOSIS — R310 Gross hematuria: Secondary | ICD-10-CM | POA: Diagnosis not present

## 2022-04-18 DIAGNOSIS — N3040 Irradiation cystitis without hematuria: Secondary | ICD-10-CM | POA: Diagnosis not present

## 2022-04-18 DIAGNOSIS — N320 Bladder-neck obstruction: Secondary | ICD-10-CM | POA: Diagnosis not present

## 2022-04-18 DIAGNOSIS — N393 Stress incontinence (female) (male): Secondary | ICD-10-CM | POA: Diagnosis not present

## 2022-04-18 DIAGNOSIS — R82998 Other abnormal findings in urine: Secondary | ICD-10-CM | POA: Diagnosis not present

## 2022-04-18 DIAGNOSIS — R800 Isolated proteinuria: Secondary | ICD-10-CM | POA: Diagnosis not present

## 2022-04-18 DIAGNOSIS — N5231 Erectile dysfunction following radical prostatectomy: Secondary | ICD-10-CM | POA: Diagnosis not present

## 2022-04-18 DIAGNOSIS — Z8546 Personal history of malignant neoplasm of prostate: Secondary | ICD-10-CM | POA: Diagnosis not present

## 2022-04-24 ENCOUNTER — Ambulatory Visit (INDEPENDENT_AMBULATORY_CARE_PROVIDER_SITE_OTHER): Payer: Medicare HMO | Admitting: Internal Medicine

## 2022-04-24 ENCOUNTER — Encounter: Payer: Self-pay | Admitting: Internal Medicine

## 2022-04-24 DIAGNOSIS — I10 Essential (primary) hypertension: Secondary | ICD-10-CM

## 2022-04-24 MED ORDER — AMLODIPINE BESYLATE 10 MG PO TABS: 10.0000 mg | ORAL_TABLET | Freq: Every day | ORAL | 1 refills | Status: DC

## 2022-04-24 MED ORDER — BENAZEPRIL HCL 20 MG PO TABS: 20.0000 mg | ORAL_TABLET | Freq: Every day | ORAL | 1 refills | Status: DC

## 2022-04-24 NOTE — Progress Notes (Signed)
? ?Subjective:  ?Patient ID: Corey [name], male    DOB: 12-30-1950  Age: 71 y.o. MRN: [id number] ? ?CC: Hypertension ? ? ?HPI ?Corey [name] presents for f/up - ? ?He walks his dog and does not experience chest pain, shortness of breath, diaphoresis, or edema. ? ?Outpatient Medications Prior to Visit  ?Medication Sig Dispense Refill  ? atorvastatin (LIPITOR) 10 MG tablet TAKE 1 TABLET BY MOUTH EVERY DAY 90 tablet 1  ? Multiple Vitamin (MULTIVITAMIN WITH MINERALS) TABS tablet Take 1 tablet by mouth daily.    ? pioglitazone (ACTOS) 15 MG tablet TAKE 1 TABLET BY MOUTH EVERY DAY 90 tablet 1  ? amLODipine (NORVASC) 10 MG tablet Take 1 tablet (10 mg total) by mouth daily. 90 tablet 1  ? benazepril (LOTENSIN) 20 MG tablet Take 1 tablet (20 mg total) by mouth daily. 90 tablet 1  ? ?No facility-administered medications prior to visit.  ? ? ?ROS ?Review of Systems  ?Constitutional:  Negative for diaphoresis and fatigue.  ?HENT: Negative.    ?Eyes: Negative.   ?Respiratory: Negative.  Negative for cough, shortness of breath and wheezing.   ?Cardiovascular:  Negative for chest pain, palpitations and leg swelling.  ?Gastrointestinal:  Negative for abdominal pain, diarrhea, nausea and vomiting.  ?Endocrine: Negative.   ?Genitourinary: Negative.  Negative for difficulty urinating.  ?Musculoskeletal: Negative.   ?Skin: Negative.   ?Neurological: Negative.  Negative for dizziness and weakness.  ?Hematological:  Negative for adenopathy. Does not bruise/bleed easily.  ?Psychiatric/Behavioral: Negative.    ? ?Objective:  ?BP 136/74 (BP Location: Left Arm, Patient Position: Sitting, Cuff Size: Large)   Pulse 76   Temp 97.6 ?F (36.4 ?C) (Oral)   Resp 16   Ht 5' 10"  (1.778 m)   Wt 213 lb (96.6 kg)   SpO2 98%   BMI 30.56 kg/m?  ? ?BP Readings from Last 3 Encounters:  ?04/24/22 136/74  ?04/09/22 124/76  ?10/22/21 128/84  ? ? ?Wt Readings from Last 3 Encounters:  ?04/24/22 213 lb (96.6 kg)  ?04/09/22 217 lb (98.4 kg)  ?10/22/21 203 lb  (92.1 kg)  ? ? ?Physical Exam ?Vitals reviewed.  ?HENT:  ?   Nose: Nose normal.  ?Eyes:  ?   General: No scleral icterus. ?   Conjunctiva/sclera: Conjunctivae normal.  ?Cardiovascular:  ?   Rate and Rhythm: Normal rate and regular rhythm.  ?   Heart sounds: No murmur heard. ?Pulmonary:  ?   Effort: Pulmonary effort is normal.  ?   Breath sounds: No stridor. No wheezing, rhonchi or rales.  ?Abdominal:  ?   General: Abdomen is flat.  ?   Palpations: There is no mass.  ?   Tenderness: There is no abdominal tenderness. There is no guarding.  ?   Hernia: No hernia is present.  ?Musculoskeletal:     ?   General: Normal range of motion.  ?   Cervical back: Neck supple.  ?   Right lower leg: No edema.  ?   Left lower leg: No edema.  ?Lymphadenopathy:  ?   Cervical: No cervical adenopathy.  ?Skin: ?   General: Skin is warm and dry.  ?Neurological:  ?   General: No focal deficit present.  ?   Mental Status: He is alert.  ?Psychiatric:     ?   Mood and Affect: Mood normal.     ?   Behavior: Behavior normal.  ? ? ?Lab Results  ?Component Value Date  ? WBC 6.4 03/25/2022  ?  HGB 14.1 03/25/2022  ? HCT 41 03/25/2022  ? PLT 148 (A) 03/25/2022  ? GLUCOSE 86 10/22/2021  ? CHOL 135 10/22/2021  ? TRIG 59.0 10/22/2021  ? HDL 59.90 10/22/2021  ? [place] 63 10/22/2021  ? ALT 31 03/25/2022  ? AST 82 (A) 03/25/2022  ? NA 138 03/25/2022  ? K 4.1 03/25/2022  ? CL 105 03/25/2022  ? CREATININE 0.9 03/25/2022  ? BUN 21 03/25/2022  ? CO2 26 (A) 03/25/2022  ? TSH 2.03 10/22/2021  ? PSA 0.08 (L) 10/22/2021  ? INR 1.0 10/22/2020  ? HGBA1C 5.6 09/17/2020  ? ? ?US Abdomen Limited RUQ ? ?Result Date: 06/13/2019 ?CLINICAL DATA:  Elevated LFTs EXAM: ULTRASOUND ABDOMEN LIMITED RIGHT UPPER QUADRANT COMPARISON:  None. FINDINGS: Gallbladder: No gallstones or wall thickening visualized. No sonographic Murphy sign noted by sonographer. Common bile duct: Diameter: 5 mm Liver: No solid lesion identified. Simple cyst measuring 2.1 cm of the right lobe.  Heterogeneous echotexture. Portal vein is patent on color Doppler imaging with normal direction of blood flow towards the liver. IMPRESSION: Heterogeneous hepatic echotexture suggestive of chronic parenchymal liver disease. No overt stigmata of cirrhosis. No other findings to explain elevated LFTs. Electronically Signed   By: Eddie Candle M.D.   On: 06/13/2019 08:33  ? ? ?Assessment & Plan:  ? ?Tyreese was seen today for hypertension. ? ?Diagnoses and all orders for this visit: ? ?Essential hypertension, benign- I reviewed labs done elsewhere about a month ago.  His blood pressure is well controlled.  Will continue the combination of a CCB and ACE inhibitor. ?-     amLODipine (NORVASC) 10 MG tablet; Take 1 tablet (10 mg total) by mouth daily. ?-     benazepril (LOTENSIN) 20 MG tablet; Take 1 tablet (20 mg total) by mouth daily. ? ? ?I am having [name] maintain his multivitamin with minerals, pioglitazone, atorvastatin, amLODipine, and benazepril. ? ?Meds ordered this encounter  ?Medications  ? amLODipine (NORVASC) 10 MG tablet  ?  Sig: Take 1 tablet (10 mg total) by mouth daily.  ?  Dispense:  90 tablet  ?  Refill:  1  ? benazepril (LOTENSIN) 20 MG tablet  ?  Sig: Take 1 tablet (20 mg total) by mouth daily.  ?  Dispense:  90 tablet  ?  Refill:  1  ? ? ? ?Follow-up: No follow-ups on file. ? ?Scarlette Calico, MD ?

## 2022-04-29 DIAGNOSIS — R1031 Right lower quadrant pain: Secondary | ICD-10-CM | POA: Diagnosis not present

## 2022-04-29 DIAGNOSIS — Z8719 Personal history of other diseases of the digestive system: Secondary | ICD-10-CM | POA: Diagnosis not present

## 2022-04-29 DIAGNOSIS — R109 Unspecified abdominal pain: Secondary | ICD-10-CM | POA: Diagnosis not present

## 2022-04-29 DIAGNOSIS — N3040 Irradiation cystitis without hematuria: Secondary | ICD-10-CM | POA: Diagnosis not present

## 2022-04-29 DIAGNOSIS — R1084 Generalized abdominal pain: Secondary | ICD-10-CM | POA: Diagnosis not present

## 2022-04-29 DIAGNOSIS — Z9889 Other specified postprocedural states: Secondary | ICD-10-CM | POA: Diagnosis not present

## 2022-04-29 DIAGNOSIS — Z8546 Personal history of malignant neoplasm of prostate: Secondary | ICD-10-CM | POA: Diagnosis not present

## 2022-05-05 DIAGNOSIS — R339 Retention of urine, unspecified: Secondary | ICD-10-CM | POA: Diagnosis not present

## 2022-05-05 LAB — CBC AND DIFFERENTIAL
HCT: 40 — AB (ref 41–53)
Hemoglobin: 13.8 (ref 13.5–17.5)
Platelets: 218 10*3/uL (ref 150–400)
WBC: 6.6

## 2022-05-05 LAB — BASIC METABOLIC PANEL
BUN: 27 — AB (ref 4–21)
CO2: 19 (ref 13–22)
Chloride: 104 (ref 99–108)
Creatinine: 1.2 (ref 0.6–1.3)
Glucose: 117
Potassium: 4.5 mEq/L (ref 3.5–5.1)
Sodium: 135 — AB (ref 137–147)

## 2022-05-05 LAB — HEPATIC FUNCTION PANEL
ALT: 34 U/L (ref 10–40)
AST: 72 — AB (ref 14–40)
Alkaline Phosphatase: 112 (ref 25–125)
Bilirubin, Total: 0.3

## 2022-05-05 LAB — COMPREHENSIVE METABOLIC PANEL
Calcium: 9.2 (ref 8.7–10.7)
eGFR: 68

## 2022-05-06 DIAGNOSIS — R339 Retention of urine, unspecified: Secondary | ICD-10-CM | POA: Insufficient documentation

## 2022-05-07 ENCOUNTER — Ambulatory Visit (INDEPENDENT_AMBULATORY_CARE_PROVIDER_SITE_OTHER): Payer: Medicare HMO | Admitting: Internal Medicine

## 2022-05-07 ENCOUNTER — Encounter: Payer: Self-pay | Admitting: Internal Medicine

## 2022-05-07 ENCOUNTER — Ambulatory Visit (INDEPENDENT_AMBULATORY_CARE_PROVIDER_SITE_OTHER): Payer: Medicare HMO

## 2022-05-07 VITALS — BP 118/76 | HR 90 | Temp 98.3°F | Ht 70.0 in | Wt 205.0 lb

## 2022-05-07 DIAGNOSIS — R310 Gross hematuria: Secondary | ICD-10-CM

## 2022-05-07 DIAGNOSIS — R10814 Left lower quadrant abdominal tenderness: Secondary | ICD-10-CM | POA: Diagnosis not present

## 2022-05-07 DIAGNOSIS — R1030 Lower abdominal pain, unspecified: Secondary | ICD-10-CM | POA: Diagnosis not present

## 2022-05-07 LAB — URINALYSIS, ROUTINE W REFLEX MICROSCOPIC
Bilirubin Urine: NEGATIVE
Ketones, ur: NEGATIVE
Nitrite: NEGATIVE
Specific Gravity, Urine: 1.025 (ref 1.000–1.030)
Total Protein, Urine: 30 — AB
Urine Glucose: NEGATIVE
Urobilinogen, UA: 0.2 (ref 0.0–1.0)
pH: 5.5 (ref 5.0–8.0)

## 2022-05-07 NOTE — Progress Notes (Addendum)
? ?Subjective:  ?Patient ID: Corey [name], male    DOB: 1951/01/13  Age: 71 y.o. MRN: [id number] ? ?CC: Abdominal Pain ? ? ?HPI ?Corey [name] presents for f/up - ? ?He complains of a 10-day history of bilateral lower quadrant abdominal pain and intermittent gross hematuria.  He has been seen several times elsewhere and his urinalysis was suspicious for infection.  He has completed a course of antibiotics but says the abd/pelvic pain continues.  The pain can be so severe that it interferes with his ability to walk.  He has nocturia every 2 hours during the night but denies urinary straining.  He has mild dysuria. ? ?Outpatient Medications Prior to Visit  ?Medication Sig Dispense Refill  ? amLODipine (NORVASC) 10 MG tablet Take 1 tablet (10 mg total) by mouth daily. 90 tablet 1  ? atorvastatin (LIPITOR) 10 MG tablet TAKE 1 TABLET BY MOUTH EVERY DAY 90 tablet 1  ? benazepril (LOTENSIN) 20 MG tablet Take 1 tablet (20 mg total) by mouth daily. 90 tablet 1  ? Multiple Vitamin (MULTIVITAMIN WITH MINERALS) TABS tablet Take 1 tablet by mouth daily.    ? pioglitazone (ACTOS) 15 MG tablet TAKE 1 TABLET BY MOUTH EVERY DAY 90 tablet 1  ? ?No facility-administered medications prior to visit.  ? ? ?ROS ?Review of Systems  ?Constitutional:  Negative for chills, diaphoresis, fatigue and fever.  ?HENT: Negative.    ?Eyes: Negative.   ?Respiratory:  Negative for cough, shortness of breath and wheezing.   ?Cardiovascular:  Negative for chest pain, palpitations and leg swelling.  ?Gastrointestinal:  Positive for abdominal pain. Negative for blood in stool, constipation, diarrhea, nausea and vomiting.  ?Endocrine: Negative.   ?Genitourinary:  Positive for dysuria and hematuria. Negative for difficulty urinating, flank pain, frequency, penile discharge, penile swelling, scrotal swelling, testicular pain and urgency.  ?Musculoskeletal: Negative.   ?Skin: Negative.   ?Neurological:  Negative for dizziness, weakness, light-headedness and  headaches.  ?Hematological:  Negative for adenopathy. Does not bruise/bleed easily.  ?Psychiatric/Behavioral: Negative.    ? ?Objective:  ?BP 118/76 (BP Location: Left Arm, Patient Position: Sitting, Cuff Size: Large)   Pulse 90   Temp 98.3 ?F (36.8 ?C) (Oral)   Ht 5' 10"  (1.778 m)   Wt 205 lb (93 kg)   SpO2 95%   BMI 29.41 kg/m?  ? ?BP Readings from Last 3 Encounters:  ?05/07/22 118/76  ?04/24/22 136/74  ?04/09/22 124/76  ? ? ?Wt Readings from Last 3 Encounters:  ?05/07/22 205 lb (93 kg)  ?04/24/22 213 lb (96.6 kg)  ?04/09/22 217 lb (98.4 kg)  ? ? ?Physical Exam ?Vitals reviewed.  ?Constitutional:   ?   General: He is not in acute distress. ?   Appearance: He is not ill-appearing, toxic-appearing or diaphoretic.  ?HENT:  ?   Nose: Nose normal.  ?   Mouth/Throat:  ?   Mouth: Mucous membranes are moist.  ?Eyes:  ?   General: No scleral icterus. ?   Conjunctiva/sclera: Conjunctivae normal.  ?Cardiovascular:  ?   Rate and Rhythm: Normal rate and regular rhythm.  ?   Heart sounds: No murmur heard. ?Pulmonary:  ?   Effort: Pulmonary effort is normal.  ?   Breath sounds: No stridor. No wheezing, rhonchi or rales.  ?Abdominal:  ?   General: Abdomen is flat. Bowel sounds are decreased.  ?   Palpations: There is no hepatomegaly, splenomegaly or mass.  ?   Tenderness: There is abdominal tenderness in the right lower  quadrant, suprapubic area and left lower quadrant. There is no guarding or rebound.  ?   Hernia: No hernia is present.  ?Musculoskeletal:     ?   General: Normal range of motion.  ?   Cervical back: Neck supple.  ?   Right lower leg: No edema.  ?Lymphadenopathy:  ?   Cervical: No cervical adenopathy.  ?Skin: ?   General: Skin is warm.  ?Neurological:  ?   General: No focal deficit present.  ?   Mental Status: He is alert. Mental status is at baseline.  ?Psychiatric:     ?   Mood and Affect: Mood normal.     ?   Behavior: Behavior normal.  ? ? ?Lab Results  ?Component Value Date  ? WBC 6.6 05/05/2022  ? HGB  13.8 05/05/2022  ? HCT 40 (A) 05/05/2022  ? PLT 218 05/05/2022  ? GLUCOSE 86 10/22/2021  ? CHOL 135 10/22/2021  ? TRIG 59.0 10/22/2021  ? HDL 59.90 10/22/2021  ? [place] 63 10/22/2021  ? ALT 34 05/05/2022  ? AST 72 (A) 05/05/2022  ? NA 135 (A) 05/05/2022  ? K 4.5 05/05/2022  ? CL 104 05/05/2022  ? CREATININE 1.2 05/05/2022  ? BUN 27 (A) 05/05/2022  ? CO2 19 05/05/2022  ? TSH 2.03 10/22/2021  ? PSA 0.08 (L) 10/22/2021  ? INR 1.0 10/22/2020  ? HGBA1C 5.6 09/17/2020  ? ? ?US Abdomen Limited RUQ ? ?Result Date: 06/13/2019 ?CLINICAL DATA:  Elevated LFTs EXAM: ULTRASOUND ABDOMEN LIMITED RIGHT UPPER QUADRANT COMPARISON:  None. FINDINGS: Gallbladder: No gallstones or wall thickening visualized. No sonographic Murphy sign noted by sonographer. Common bile duct: Diameter: 5 mm Liver: No solid lesion identified. Simple cyst measuring 2.1 cm of the right lobe. Heterogeneous echotexture. Portal vein is patent on color Doppler imaging with normal direction of blood flow towards the liver. IMPRESSION: Heterogeneous hepatic echotexture suggestive of chronic parenchymal liver disease. No overt stigmata of cirrhosis. No other findings to explain elevated LFTs. Electronically Signed   By: Eddie Candle M.D.   On: 06/13/2019 08:33  ? ?DG ABD ACUTE 2+V W 1V CHEST ? ?Result Date: 05/07/2022 ?CLINICAL DATA:  Bilateral lower quadrant pain and hematuria 1 year EXAM: DG ABDOMEN ACUTE WITH 1 VIEW CHEST COMPARISON:  Chest two-view 02/23/2017 FINDINGS: There is no evidence of dilated bowel loops or free intraperitoneal air. No radiopaque calculi or other significant radiographic abnormality is seen. Heart size and mediastinal contours are within normal limits. Both lungs are clear. IMPRESSION: Negative abdominal radiographs.  No acute cardiopulmonary disease. Electronically Signed   By: Franchot Gallo M.D.   On: 05/07/2022 13:41    ? ?Assessment & Plan:  ? ?Corey [name] was seen today for abdominal pain. ? ?Diagnoses and all orders for this  visit: ? ?Left lower quadrant abdominal tenderness without rebound tenderness- His plain films are reassuring.  He continues to have hematuria and pyuria.  I recommended that he undergo a CT scan with contrast to screen for occult infection/abscess in the abdomen and pelvis.  His recent labs showed a slight decrease in the bicarb so will evaluate for ischemic bowel. ?-     Urinalysis, Routine w reflex microscopic; Future ?-     CULTURE, URINE COMPREHENSIVE; Future ?-     DG ABD ACUTE 2+V W 1V CHEST; Future ?-     CULTURE, URINE COMPREHENSIVE ?-     Urinalysis, Routine w reflex microscopic ?-     CT Abdomen Pelvis W Contrast; Future ? ?  Gross hematuria- I recommended a CT scan of the abdomen and pelvis with contrast to evaluate for structural lesions in the kidneys and bladder. ?-     Urinalysis, Routine w reflex microscopic; Future ?-     CULTURE, URINE COMPREHENSIVE; Future ?-     DG ABD ACUTE 2+V W 1V CHEST; Future ?-     CULTURE, URINE COMPREHENSIVE ?-     Urinalysis, Routine w reflex microscopic ?-     CT Abdomen Pelvis W Contrast; Future ? ? ?I am having [name] maintain his multivitamin with minerals, pioglitazone, atorvastatin, amLODipine, and benazepril. ? ?No orders of the defined types were placed in this encounter. ? ? ? ?Follow-up: No follow-ups on file. ? ?Scarlette Calico, MD ?

## 2022-05-08 ENCOUNTER — Other Ambulatory Visit: Payer: Self-pay | Admitting: Internal Medicine

## 2022-05-08 ENCOUNTER — Encounter: Payer: Self-pay | Admitting: Internal Medicine

## 2022-05-08 ENCOUNTER — Ambulatory Visit (INDEPENDENT_AMBULATORY_CARE_PROVIDER_SITE_OTHER)
Admission: RE | Admit: 2022-05-08 | Discharge: 2022-05-08 | Disposition: A | Discharge status: Home / Self Care | Payer: Medicare HMO | Source: Ambulatory Visit | Attending: Internal Medicine | Admitting: Internal Medicine

## 2022-05-08 DIAGNOSIS — N3091 Cystitis, unspecified with hematuria: Secondary | ICD-10-CM

## 2022-05-08 DIAGNOSIS — R10814 Left lower quadrant abdominal tenderness: Secondary | ICD-10-CM | POA: Diagnosis not present

## 2022-05-08 DIAGNOSIS — Z8546 Personal history of malignant neoplasm of prostate: Secondary | ICD-10-CM | POA: Diagnosis not present

## 2022-05-08 DIAGNOSIS — K4020 Bilateral inguinal hernia, without obstruction or gangrene, not specified as recurrent: Secondary | ICD-10-CM | POA: Diagnosis not present

## 2022-05-08 DIAGNOSIS — R319 Hematuria, unspecified: Secondary | ICD-10-CM | POA: Diagnosis not present

## 2022-05-08 DIAGNOSIS — N3040 Irradiation cystitis without hematuria: Secondary | ICD-10-CM | POA: Diagnosis not present

## 2022-05-08 DIAGNOSIS — Z0489 Encounter for examination and observation for other specified reasons: Secondary | ICD-10-CM | POA: Diagnosis not present

## 2022-05-08 DIAGNOSIS — K5730 Diverticulosis of large intestine without perforation or abscess without bleeding: Secondary | ICD-10-CM | POA: Diagnosis not present

## 2022-05-08 DIAGNOSIS — R310 Gross hematuria: Secondary | ICD-10-CM | POA: Diagnosis not present

## 2022-05-08 DIAGNOSIS — N3041 Irradiation cystitis with hematuria: Secondary | ICD-10-CM | POA: Diagnosis not present

## 2022-05-08 DIAGNOSIS — I498 Other specified cardiac arrhythmias: Secondary | ICD-10-CM | POA: Diagnosis not present

## 2022-05-08 DIAGNOSIS — K429 Umbilical hernia without obstruction or gangrene: Secondary | ICD-10-CM | POA: Diagnosis not present

## 2022-05-08 DIAGNOSIS — N3289 Other specified disorders of bladder: Secondary | ICD-10-CM | POA: Diagnosis not present

## 2022-05-08 DIAGNOSIS — R161 Splenomegaly, not elsewhere classified: Secondary | ICD-10-CM | POA: Diagnosis not present

## 2022-05-08 MED ORDER — IOHEXOL 300 MG/ML  SOLN
100.0000 mL | Freq: Once | INTRAMUSCULAR | Status: AC | PRN
  Administered 2022-05-08: 100 mL via INTRAVENOUS

## 2022-05-08 MED ORDER — SULFAMETHOXAZOLE-TRIMETHOPRIM 800-160 MG PO TABS: 1.0000 | ORAL_TABLET | Freq: Two times a day (BID) | ORAL | 0 refills | Status: AC

## 2022-05-09 LAB — CULTURE, URINE COMPREHENSIVE: RESULT:: NO GROWTH

## 2022-05-13 DIAGNOSIS — H353221 Exudative age-related macular degeneration, left eye, with active choroidal neovascularization: Secondary | ICD-10-CM | POA: Diagnosis not present

## 2022-05-27 ENCOUNTER — Ambulatory Visit (INDEPENDENT_AMBULATORY_CARE_PROVIDER_SITE_OTHER): Payer: Medicare HMO

## 2022-05-27 ENCOUNTER — Telehealth: Payer: Medicare HMO

## 2022-05-27 DIAGNOSIS — R739 Hyperglycemia, unspecified: Secondary | ICD-10-CM

## 2022-05-27 DIAGNOSIS — K7581 Nonalcoholic steatohepatitis (NASH): Secondary | ICD-10-CM

## 2022-05-27 DIAGNOSIS — I10 Essential (primary) hypertension: Secondary | ICD-10-CM

## 2022-05-27 DIAGNOSIS — E785 Hyperlipidemia, unspecified: Secondary | ICD-10-CM

## 2022-05-27 NOTE — Progress Notes (Signed)
Chronic Care Management Pharmacy Note  05/27/2022 Name:  Corey [name] MRN:  [id number] DOB:  1951/03/19  Summary: -Pt endorses compliance with current medications at this time, denies any issues with concerning his medications  -BP controlled with most recent office visit, last LDL at goal -Following closely with urology and wound care regarding hematuria and abdominal pains  Recommendations/Changes made from today's visit: -Recommending no changes to current medications -Pt to reach out with any issues or concerns   Plan: -F/u in 6 months    Subjective: Corey [name] is an 71 y.o. year old male who is a primary patient of Janith Lima, MD.  The CCM team was consulted for assistance with disease management and care coordination needs.    Engaged with patient by telephone for follow up visit in response to provider referral for pharmacy case management and/or care coordination services.   Consent to Services:  The patient was given information about Chronic Care Management services, agreed to services, and gave verbal consent prior to initiation of services.  Please see initial visit note for detailed documentation.   Patient Care Team: Janith Lima, MD as PCP - General (Internal Medicine) Remer Macho, MD as Attending Physician (Ophthalmology) Delice Bison Darnelle Maffucci, [facility] as Pharmacist (Pharmacist)   Patient grew up in [place], job transferred to [employer] in 2011. 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 He has a daughter in [place].   Recent office visits: 05/07/2022 - Dr. Ronnald Ramp - pelvic pain / gross hematuria - CT scan ordered, no changes to medications  04/24/2022 - Dr. Ronnald Ramp - no changes to medications  04/09/2022 - Harland Dingwall NP-C - gross hematuria no changes to medications - follow up with urologist   Recent consult visits: 05/08/2022 - Dr. Zigmund Loomis Anacker - [facility] - no changes to medication - pt referred for hyperbaric oxygen treatments  04/29/2022 -  Evelina Dun NP - slow flow, intermittent dysuria, bilateral inguinal pain x 3 weeks, no changes to medications- consider referred for hyperbaric oxygen treatment  04/18/2022 Azucena Fallen FNP - [facility] - gross hematuria - referral for consideration of hyperbaric oxygen therapy 03/27/2022 - Dr. Odis Luster - [facility] - gross hematuria - no changes to medication - f/u in 3 months   Hospital visits: 05/05/2022 - ED visit - urinary retention - pt eloped  04/29/2022 - ED visit - abdominal pain - possible UTI - bactrim rx'd  03/25/2022 - ED visit - hematuria - no evidence of UTI - no changes to medications, follow up with urology   Objective:  Lab Results  Component Value Date   NA 135 (A) 05/05/2022   K 4.5 05/05/2022   CO2 19 05/05/2022   GLUCOSE 86 10/22/2021   BUN 27 (A) 05/05/2022   CREATININE 1.2 05/05/2022   CALCIUM 9.2 05/05/2022   GFRNONAA >60 03/13/2017   GFRAA >60 03/13/2017    Lab Results  Component Value Date/Time   HGBA1C 5.6 09/17/2020 12:00 AM   HGBA1C 6.2 05/10/2019 04:16 PM   HGBA1C 5.6 06/29/2017 04:46 PM   GFR 88.53 10/22/2021 01:10 PM   GFR 83.95 10/22/2020 09:23 AM    Last diabetic Eye exam:  Lab Results  Component Value Date/Time   HMDIABEYEEXA No Retinopathy 09/06/2020 12:00 AM    Last diabetic Foot exam: No results found for: HMDIABFOOTEX   Lab Results  Component Value Date   CHOL 135 10/22/2021   HDL 59.90 10/22/2021  LDLCALC 63 10/22/2021   TRIG 59.0 10/22/2021   CHOLHDL 2 10/22/2021       Latest Ref Rng & Units 05/05/2022   12:00 AM 03/25/2022   12:00 AM 10/22/2021    1:10 PM  Hepatic Function  Total Protein 6.0 - 8.3 g/dL   6.8    Albumin 3.5 - 5.0  4.2      4.2    AST 14 - 40 72      82      73    ALT 10 - 40 U/L 34      31      31    Alk Phosphatase 25 - 125 112      86      74    Total Bilirubin 0.2 - 1.2 mg/dL   0.5    Bilirubin, Direct 0.0 - 0.3 mg/dL   0.1       This result is from an external source.    Lab Results  Component  Value Date/Time   TSH 2.03 10/22/2021 01:10 PM   TSH 2.60 10/22/2020 09:23 AM       Latest Ref Rng & Units 05/05/2022   12:00 AM 03/25/2022   12:00 AM 10/22/2021    1:10 PM  CBC  WBC  6.6      6.4      6.1    Hemoglobin 13.5 - 17.5 13.8      14.1      13.8    Hematocrit 41 - 53 40      41      41.6    Platelets 150 - 400 K/uL 218      148      171.0       This result is from an external source.    No results found for: VD25OH  Clinical ASCVD: No  The 10-year ASCVD risk score (Arnett DK, et al., 2019) is: 26.2%   Values used to calculate the score:     Age: 72 years     Sex: Male     Is Non-Hispanic African American: No     Diabetic: Yes     Tobacco smoker: No     Systolic Blood Pressure: 818 mmHg     Is BP treated: Yes     HDL Cholesterol: 59.9 mg/dL     Total Cholesterol: 135 mg/dL       04/09/2022   11:06 AM 06/03/2021    3:11 PM 04/09/2020    3:45 PM  Depression screen PHQ 2/9  Decreased Interest 0 0 0  Down, Depressed, Hopeless 0 0 0  PHQ - 2 Score 0 0 0     Social History   Tobacco Use  Smoking Status Never   Passive exposure: Never  Smokeless Tobacco Never   BP Readings from Last 3 Encounters:  05/07/22 118/76  04/24/22 136/74  04/09/22 124/76   Pulse Readings from Last 3 Encounters:  05/07/22 90  04/24/22 76  04/09/22 70   Wt Readings from Last 3 Encounters:  05/07/22 205 lb (93 kg)  04/24/22 213 lb (96.6 kg)  04/09/22 217 lb (98.4 kg)   BMI Readings from Last 3 Encounters:  05/07/22 29.41 kg/m  04/24/22 30.56 kg/m  04/09/22 31.14 kg/m   Assessment/Interventions: Review of patient past medical history, allergies, medications, health status, including review of consultants reports, laboratory and other test data, was performed as part of comprehensive evaluation and provision of chronic care management services.  SDOH:  (Social Determinants of Health) assessments and interventions performed: Yes  SDOH Screenings   Alcohol Screen: Low  Risk    Last Alcohol Screening Score (AUDIT): 4  Depression (PHQ2-9): Low Risk    PHQ-2 Score: 0  Financial Resource Strain: Low Risk    Difficulty of Paying Living Expenses: Not hard at all  Food Insecurity: No Food Insecurity   Worried About Charity fundraiser in the Last Year: Never true   Ran Out of Food in the Last Year: Never true  Housing: Low Risk    Last Housing Risk Score: 0  Physical Activity: Sufficiently Active   Days of Exercise per Week: 5 days   Minutes of Exercise per Session: 30 min  Social Connections: Engineer, building services of Communication with Friends and Family: More than three times a week   Frequency of Social Gatherings with Friends and Family: More than three times a week   Attends Religious Services: More than 4 times per year   Active Member of [employer] or Organizations: No   Attends Music therapist: More than 4 times per year   Marital Status: Married  Stress: No Stress Concern Present   Feeling of Stress : Not at all  Tobacco Use: Low Risk    Smoking Tobacco Use: Never   Smokeless Tobacco Use: Never   Passive Exposure: Never  Transportation Needs: No Transportation Needs   Lack of Transportation (Medical): No   Lack of Transportation (Non-Medical): No    CCM Care Plan  No Known Allergies  Medications Reviewed Today     Reviewed by Janith Lima, MD (Physician) on 05/07/22 at [place]  Med List Status: <None>   Medication Order Taking? Sig Documenting Provider Last Dose Status Informant  amLODipine (NORVASC) 10 MG tablet [id number] Yes Take 1 tablet (10 mg total) by mouth daily. Janith Lima, MD Taking Active   atorvastatin (LIPITOR) 10 MG tablet [id number] Yes TAKE 1 TABLET BY MOUTH EVERY DAY Janith Lima, MD Taking Active   benazepril (LOTENSIN) 20 MG tablet [id number] Yes Take 1 tablet (20 mg total) by mouth daily. Janith Lima, MD Taking Active   Multiple Vitamin (MULTIVITAMIN WITH MINERALS) TABS tablet [id number]  Yes Take 1 tablet by mouth daily. [provider] Taking Active Self  pioglitazone (ACTOS) 15 MG tablet [id number] Yes TAKE 1 TABLET BY MOUTH EVERY DAY Janith Lima, MD Taking Active             Patient Active Problem List   Diagnosis Date Noted   Gross hematuria 05/07/2022   Left lower quadrant abdominal tenderness without rebound tenderness 05/07/2022   H/O prostate cancer 12/08/2019   Male urinary stress incontinence 05/27/2019   Bladder neck contracture 02/03/2019   Radiation cystitis 02/03/2019   Cataract, nuclear sclerotic, left eye 01/14/2019   Hyperlipidemia with target LDL less than 130 07/11/2016   Essential hypertension, benign 08/05/2013   Prostate cancer ([place]) 08/05/2013   Routine general medical examination at a health care facility 08/05/2013    Immunization History  Administered Date(s) Administered   Fluad Quad(high Dose 65+) 10/22/2020   Influenza, High Dose Seasonal PF 02/10/2018   Influenza-Unspecified 09/27/2019, 10/15/2021   PFIZER(Purple Top)SARS-COV-2 Vaccination 03/15/2020, 04/10/2020, 10/04/2020, 04/04/2021   Pneumococcal Conjugate-13 06/29/2017   Pneumococcal Polysaccharide-23 08/05/2013, 05/10/2019   Tdap 08/05/2013   Zoster Recombinat (Shingrix) 10/29/2020, 06/20/2021    Conditions to be addressed/monitored:  Hypertension, Hyperlipidemia and NASH  Care Plan : [place]  Plan  Updates made by Tomasa Blase, RPH since 05/27/2022 12:00 AM     Problem: Hypertension, Hyperlipidemia and NASH   Priority: High     Long-Range Goal: Disease management   Start Date: 03/13/2021  Expected End Date: 05/28/2023  This Visit's Progress: On track  Recent Progress: On track  Priority: High  Note:   Current Barriers:  Unable to independently monitor therapeutic efficacy  Pharmacist Clinical Goal(s):  Patient will achieve adherence to monitoring guidelines and medication adherence to achieve therapeutic efficacy through collaboration  with PharmD and provider.   Interventions: 1:1 collaboration with Janith Lima, MD regarding development and update of comprehensive plan of care as evidenced by provider attestation and co-signature Inter-disciplinary care team collaboration (see longitudinal plan of care) Comprehensive medication review performed; medication list updated in electronic medical record  Hypergylcemia/NASH    Lab Results  Component Value Date   HGBA1C 5.6 09/17/2020  Patient has failed these meds in past: n/a Patient is currently controlled on the following medications:  pioglitazone 15 mg daily    Plan: Continue current medications and control with diet and exercise    Hypertension    BP goal < 130/80 Patient does not check BP at home but has frequent office visits with urologist and BP is always "normal" BP Readings from Last 3 Encounters:  05/07/22 118/76  04/24/22 136/74  04/09/22 124/76   Patient has tried/failed these meds in the past: HCTZ  Patient is currently controlled on the following medications:  amlodipine 10 mg daily benazepril 20 mg daily   Plan: Continue current medications and control with diet and exercise     Hyperlipidemia    LDL goal < 100 Lab Results  Component Value Date   [facility] 63 10/22/2021  Patient has failed these meds in past: n/a Patient is currently controlled on the following medications:  atorvastatin 10 mg daily   Plan: Continue current medications and control with diet and exercise   Patient Goals/Self-Care Activities Patient will:  - take medications as prescribed focus on medication adherence by pill box check blood pressure as needed, document, and provide at future appointments target a minimum of 150 minutes of moderate intensity exercise weekly  Follow Up Plan: Telephone follow up appointment with care management team member scheduled for: 6 months       Medication Assistance: None required.  Patient affirms current coverage meets  needs.  Patient's preferred pharmacy is:  [id number] [place] [address] [place] [place]Phone: [phone]: [phone] Uses pill box? Yes Pt endorses 100% compliance  Care Plan and Follow Up Patient Decision:  Patient agrees to Care Plan and Follow-up.  Plan: Telephone follow up appointment with care management team member scheduled for:  6 months  DTomasa Blase PharmD Clinical Pharmacist, [place]

## 2022-05-27 NOTE — Patient Instructions (Signed)
Visit Information  Following are the goals we discussed today:   Manage My Medicine   Timeframe:  Long-Range Goal Priority:  Medium Start Date:   05/27/2022                          Expected End Date:   05/28/2023                    Follow Up Date 10/2022   - call for medicine refill 2 or 3 days before it runs out - call if I am sick and can't take my medicine - keep a list of all the medicines I take; vitamins and herbals too - learn to read medicine labels    Why is this important?   These steps will help you keep on track with your medicines.  Plan: Telephone follow up appointment with care management team member scheduled for:  6 months The patient has been provided with contact information for the care management team and has been advised to call with any health related questions or concerns.   Tomasa Blase, PharmD Clinical Pharmacist, [facility]   Please call the care guide team at [phone] if you need to cancel or reschedule your appointment.   Patient verbalizes understanding of instructions and care plan provided today and agrees to view in [place]. Active MyChart status and patient understanding of how to access instructions and care plan via MyChart confirmed with patient.

## 2022-05-28 DIAGNOSIS — E785 Hyperlipidemia, unspecified: Secondary | ICD-10-CM | POA: Diagnosis not present

## 2022-05-28 DIAGNOSIS — I10 Essential (primary) hypertension: Secondary | ICD-10-CM

## 2022-05-29 ENCOUNTER — Telehealth: Payer: Self-pay | Admitting: Internal Medicine

## 2022-05-29 NOTE — Telephone Encounter (Signed)
Left message for patient to call back to schedule Medicare Annual Wellness Visit   Last AWV  06/03/21  Please schedule at anytime with [facility] if patient calls the office back.     Any questions, please call me at [phone]

## 2022-06-11 ENCOUNTER — Ambulatory Visit (INDEPENDENT_AMBULATORY_CARE_PROVIDER_SITE_OTHER): Payer: Medicare HMO

## 2022-06-11 DIAGNOSIS — Z0000 Encounter for general adult medical examination without abnormal findings: Secondary | ICD-10-CM | POA: Diagnosis not present

## 2022-06-11 NOTE — Progress Notes (Signed)
Subjective:   Corey [name] is a 71 y.o. male who presents for Medicare Annual/Subsequent preventive examination.  I connected with Corey [name] today by telephone and verified that I am speaking with the correct person using two identifiers. Location patient: home Location provider: work  Review of Systems    No ROS. Medicare Wellness Telephone Visit. Additional risk factors are reflected in social history.       Objective:    Today's Vitals   06/11/22 1546  PainSc: 6    There is no height or weight on file to calculate BMI.     06/03/2021    3:12 PM 04/09/2020    3:45 PM 07/19/2018    9:34 AM 07/02/2017   11:33 AM 03/12/2017    6:30 PM 03/10/2017   11:07 AM  Advanced Directives  Does Patient Have a Medical Advance Directive? No No No No No No  Would patient like information on creating a medical advance directive? No - Patient declined Yes (ED - Information included in AVS) Yes (ED - Information included in AVS) Yes (Inpatient - patient defers creating a medical advance directive at this time) No - Patient declined Yes (MAU/Ambulatory/Procedural Areas - Information given)    Current Medications (verified) Outpatient Encounter Medications as of 06/11/2022  Medication Sig   amLODipine (NORVASC) 10 MG tablet Take 1 tablet (10 mg total) by mouth daily.   atorvastatin (LIPITOR) 10 MG tablet TAKE 1 TABLET BY MOUTH EVERY DAY   benazepril (LOTENSIN) 20 MG tablet Take 1 tablet (20 mg total) by mouth daily.   Multiple Vitamin (MULTIVITAMIN WITH MINERALS) TABS tablet Take 1 tablet by mouth daily.   pioglitazone (ACTOS) 15 MG tablet TAKE 1 TABLET BY MOUTH EVERY DAY   No facility-administered encounter medications on file as of 06/11/2022.    Allergies (verified) Patient has no known allergies.   History: Past Medical History:  Diagnosis Date   Arthritis    "minor; elbows, wrists" (03/12/2017)   BPH (benign prostatic hyperplasia) 2011   Hypertension    Incisional hernia    Prostate  cancer ([place]) 2016   S/P prostatectomy, TURP, radiation in 2017   Past Surgical History:  Procedure Laterality Date   [place] N/A 03/12/2017   Procedure: LAPAROSCOPIC [place];  Surgeon: Erroll Luna, MD;  Location: [place];  Service: General;  Laterality: N/A;   INGUINAL HERNIA REPAIR Right ~ 2007   INSERTION OF MESH N/A 03/12/2017   Procedure: INSERTION OF MESH;  Surgeon: Erroll Luna, MD;  Location: [facility];  Service: General;  Laterality: N/A;   LAPAROSCOPIC INCISIONAL / UMBILICAL / [place]  03/12/2017   IHR w/mesh   PROSTATECTOMY  2017   TRANSURETHRAL RESECTION OF PROSTATE  2017   "after they removed my prostate"   [place]  ~ 2012   Family History  Problem Relation Age of Onset   Cancer Mother    Alcohol abuse Sister    Early death Sister    Stroke Neg Hx    Kidney disease Neg Hx    Hypertension Neg Hx    Heart disease Neg Hx    Hyperlipidemia Neg Hx    Diabetes Neg Hx    Depression Neg Hx    COPD Neg Hx    Asthma Neg Hx    Social History   Socioeconomic History   Marital status: Married    Spouse name: Not on file   Number of children:  3   Years of education: Not on file   Highest education level: 12th grade  Occupational History   Not on file  Tobacco Use   Smoking status: Never    Passive exposure: Never   Smokeless tobacco: Never  Vaping Use   Vaping Use: Never used  Substance and Sexual Activity   Alcohol use: Yes    Alcohol/week: 5.0 standard drinks of alcohol    Types: 5 Glasses of wine per week   Drug use: No   Sexual activity: Yes    Partners: Male    Birth control/protection: None  Other Topics Concern   Not on file  Social History Narrative   Not on file   Social Determinants of Health   Financial Resource Strain: Low Risk  (06/11/2022)   Overall Financial Resource Strain (CARDIA)    Difficulty of Paying Living Expenses: Not hard at all  Food Insecurity: No  Food Insecurity (06/11/2022)   Hunger Vital Sign    Worried About Running Out of Food in the Last Year: Never true    Ran Out of Food in the Last Year: Never true  Transportation Needs: No Transportation Needs (06/11/2022)   PRAPARE - Hydrologist (Medical): No    Lack of Transportation (Non-Medical): No  Physical Activity: Sufficiently Active (06/11/2022)   Exercise Vital Sign    Days of Exercise per Week: 7 days    Minutes of Exercise per Session: 60 min  Stress: No Stress Concern Present (06/11/2022)   [place]    Feeling of Stress : Not at all  Social Connections: [place] (06/11/2022)   Social Connection and Isolation Panel [NHANES]    Frequency of Communication with Friends and Family: More than three times a week    Frequency of Social Gatherings with Friends and Family: More than three times a week    Attends Religious Services: More than 4 times per year    Active Member of [employer] or Organizations: Yes    Attends Music therapist: More than 4 times per year    Marital Status: Married    Tobacco Counseling Counseling given: Not Answered   Clinical Intake:  Pre-visit preparation completed: Yes  Pain : 0-10 Pain Score: 6  Pain Type: Chronic pain (blood clots in bladder due to radiation from cancer treatments) Pain Location: Bladder     Nutritional Status: BMI 25 -29 Overweight Nutritional Risks: None Diabetes: No  How often do you need to have someone help you when you read instructions, pamphlets, or other written materials from your doctor or pharmacy?: 1 - Never What is the last grade level you completed in school?: 1 year of college  Diabetic? No  Interpreter Needed?: No  Information entered by :: Jillene Bucks, CMA   Activities of Daily Living    06/11/2022    3:53 PM  In your present state of health, do you have any difficulty  performing the following activities:  Hearing? 0  Vision? 0  Difficulty concentrating or making decisions? 0  Walking or climbing stairs? 0  Dressing or bathing? 0  Doing errands, shopping? 0    Patient Care Team: Janith Lima, MD as PCP - General (Internal Medicine) Remer Macho, MD as Attending Physician (Ophthalmology) [place], Darnelle Maffucci, [facility] (Inactive) as Pharmacist (Pharmacist)  Indicate any recent Medical Services you may have received from other than [facility] providers in the past year (date may  be approximate).     Assessment:   This is a routine wellness examination for Corey [name].  Hearing/Vision screen Patient denied any hearing difficulty. No hearing aids. Patient wears glasses.  Dietary issues and exercise activities discussed:     Goals Addressed               This Visit's Progress     Patient Stated (pt-stated)        I would like to stay in my status quo       Depression Screen    06/11/2022    3:51 PM 04/09/2022   11:06 AM 06/03/2021    3:11 PM 04/09/2020    3:45 PM 07/19/2018    9:34 AM 07/02/2017   11:35 AM  PHQ 2/9 Scores  PHQ - 2 Score 0 0 0 0 0 0  PHQ- 9 Score     0     Fall Risk    06/11/2022    3:53 PM 04/09/2022   11:06 AM 06/03/2021    3:12 PM 04/09/2020    3:45 PM 07/19/2018    9:34 AM  [place] in the past year? 0 0 0 0 No  Number falls in past yr: 0 0 0 0   Injury with Fall? 0 0 0 0   Risk for fall due to : No Fall Risks No Fall Risks No Fall Risks No Fall Risks   Follow up Falls evaluation completed Falls evaluation completed Falls evaluation completed Falls evaluation completed;Education provided;Falls prevention discussed     FALL RISK PREVENTION PERTAINING TO THE HOME:  Any stairs in or around the home? Yes  If so, are there any without handrails? Yes  Home free of loose throw rugs in walkways, pet beds, electrical cords, etc? Yes  Adequate lighting in your home to reduce risk of falls? Yes   ASSISTIVE DEVICES  UTILIZED TO PREVENT FALLS:  Life alert? No  Use of a cane, walker or w/c? No  Grab bars in the bathroom? No  Shower chair or bench in shower? No  Elevated toilet seat or a handicapped toilet? No   TIMED UP AND GO:  Was the test performed? No this was phone visit.  Length of time to ambulate 10 feet: N/A sec.    Cognitive Function:  Patient is cogitatively intact.      04/09/2020    3:47 PM  6CIT Screen  What Year? 0 points  What month? 0 points  What time? 0 points  Count back from 20 0 points  Months in reverse 0 points  Repeat phrase 0 points  Total Score 0 points    Immunizations Immunization History  Administered Date(s) Administered   Fluad Quad(high Dose 65+) 10/22/2020   Influenza, High Dose Seasonal PF 02/10/2018   Influenza-Unspecified 09/27/2019, 10/15/2021   PFIZER(Purple Top)SARS-COV-2 Vaccination 03/15/2020, 04/10/2020, 10/04/2020, 04/04/2021   Pneumococcal Conjugate-13 06/29/2017   Pneumococcal Polysaccharide-23 08/05/2013, 05/10/2019   Tdap 08/05/2013   Zoster Recombinat (Shingrix) 10/28/2020, 10/29/2020, 06/20/2021    TDAP status: Up to date  Flu Vaccine status: Up to date  Pneumococcal vaccine status: Up to date  Covid-19 vaccine status: Completed vaccines  Qualifies for Shingles Vaccine? Yes   Zostavax completed No   Shingrix Completed?: Yes  Screening Tests Health Maintenance  Topic Date Due   COVID-19 Vaccine (5 - Booster for [place] series) 06/27/2022 (Originally 05/30/2021)   INFLUENZA VACCINE  07/29/2022   TETANUS/TDAP  08/06/2023   COLONOSCOPY (Pts 45-72yr Insurance coverage  will need to be confirmed)  03/11/2026   Pneumonia Vaccine 65+ Years old  Completed   Hepatitis C Screening  Completed   Zoster Vaccines- Shingrix  Completed   HPV VACCINES  Aged Out    Health Maintenance  There are no preventive care reminders to display for this patient.   Colorectal cancer screening: Type of screening: Colonoscopy. Completed  03/11/2016. Repeat every 10 years  Lung Cancer Screening: (Low Dose CT Chest recommended if Age 73-80 years, 30 pack-year currently smoking OR have quit w/in 15years.) does not qualify.   Lung Cancer Screening Referral: N/A  Additional Screening:  Hepatitis C Screening: does qualify; Completed 08/05/2013  Vision Screening: Recommended annual ophthalmology exams for early detection of glaucoma and other disorders of the eye. Is the patient up to date with their annual eye exam?  Yes  Who is the provider or what is the name of the office in which the patient attends annual eye exams? He could  not remember the clinic name as they have changed it recently If pt is not established with a provider, would they like to be referred to a provider to establish care? No .   Dental Screening: Recommended annual dental exams for proper oral hygiene  Community Resource Referral / Chronic Care Management: CRR required this visit?  No   CCM required this visit?  No      Plan:     I have personally reviewed and noted the following in the patient's chart:   Medical and social history Use of alcohol, tobacco or illicit drugs  Current medications and supplements including opioid prescriptions. Patient is not currently taking opioid prescriptions. Functional ability and status Nutritional status Physical activity Advanced directives List of other physicians Hospitalizations, surgeries, and ER visits in previous 12 months Vitals Screenings to include cognitive, depression, and falls Referrals and appointments  In addition, I have reviewed and discussed with patient certain preventive protocols, quality metrics, and best practice recommendations. A written personalized care plan for preventive services as well as general preventive health recommendations were provided to patient.     Rossie Muskrat, [place]   06/11/2022   Nurse Notes:   Reviewed health maintenance screenings with patient today  and relevant education, vaccines, and/or referrals were provided. Please continue to do your personal lifestyle choices by: daily care of teeth and gums, regular physical activity (goal should be 5 days a week for 30 minutes), eat a healthy diet, avoid tobacco and drug use, limiting any alcohol intake, taking a low-dose aspirin (if not allergic or have been advised by your provider otherwise) and taking vitamins and minerals as recommended by your provider. Continue doing brain stimulating activities (puzzles, reading, adult coloring books, staying active) to keep memory sharp. Continue to eat heart healthy diet (full of fruits, vegetables, whole grains, lean protein, water--limit salt, fat, and sugar intake) and increase physical activity as tolerated.

## 2022-06-11 NOTE — Patient Instructions (Signed)
It was great speaking with you today!  Please schedule your next Medicare Wellness Visit with your Nurse Health Advisor in 1 year by calling [phone]. 

## 2022-06-19 DIAGNOSIS — N3040 Irradiation cystitis without hematuria: Secondary | ICD-10-CM | POA: Diagnosis not present

## 2022-06-19 DIAGNOSIS — Z8546 Personal history of malignant neoplasm of prostate: Secondary | ICD-10-CM | POA: Diagnosis not present

## 2022-06-19 DIAGNOSIS — R310 Gross hematuria: Secondary | ICD-10-CM | POA: Diagnosis not present

## 2022-07-08 DIAGNOSIS — H353221 Exudative age-related macular degeneration, left eye, with active choroidal neovascularization: Secondary | ICD-10-CM | POA: Diagnosis not present

## 2022-07-10 DIAGNOSIS — Z8546 Personal history of malignant neoplasm of prostate: Secondary | ICD-10-CM | POA: Diagnosis not present

## 2022-07-10 DIAGNOSIS — E119 Type 2 diabetes mellitus without complications: Secondary | ICD-10-CM | POA: Diagnosis not present

## 2022-07-10 DIAGNOSIS — N3041 Irradiation cystitis with hematuria: Secondary | ICD-10-CM | POA: Diagnosis not present

## 2022-07-14 ENCOUNTER — Encounter: Payer: Self-pay | Admitting: Internal Medicine

## 2022-07-14 DIAGNOSIS — N3000 Acute cystitis without hematuria: Secondary | ICD-10-CM

## 2022-07-14 NOTE — Telephone Encounter (Signed)
Pt called requesting a refill on sulfamethoxazole-trimethoprim (BACTRIM DS) 800-160 MG tablet. He was prescribed that rx on 05/08/22 for cystitis. Pt stated he is having a flare up and would like to know if he can get a refill on the antibiotic.   Please advise.

## 2022-07-15 ENCOUNTER — Encounter: Payer: Self-pay | Admitting: Internal Medicine

## 2022-07-15 ENCOUNTER — Ambulatory Visit (INDEPENDENT_AMBULATORY_CARE_PROVIDER_SITE_OTHER): Payer: Medicare HMO | Admitting: Internal Medicine

## 2022-07-15 ENCOUNTER — Other Ambulatory Visit: Payer: Self-pay | Admitting: Internal Medicine

## 2022-07-15 VITALS — BP 132/80 | HR 71 | Temp 98.3°F | Ht 70.0 in | Wt 194.5 lb

## 2022-07-15 DIAGNOSIS — I10 Essential (primary) hypertension: Secondary | ICD-10-CM | POA: Diagnosis not present

## 2022-07-15 DIAGNOSIS — K4021 Bilateral inguinal hernia, without obstruction or gangrene, recurrent: Secondary | ICD-10-CM | POA: Diagnosis not present

## 2022-07-15 DIAGNOSIS — R350 Frequency of micturition: Secondary | ICD-10-CM

## 2022-07-15 DIAGNOSIS — N3040 Irradiation cystitis without hematuria: Secondary | ICD-10-CM

## 2022-07-15 DIAGNOSIS — M5416 Radiculopathy, lumbar region: Secondary | ICD-10-CM

## 2022-07-15 DIAGNOSIS — E785 Hyperlipidemia, unspecified: Secondary | ICD-10-CM

## 2022-07-15 DIAGNOSIS — N3000 Acute cystitis without hematuria: Secondary | ICD-10-CM | POA: Insufficient documentation

## 2022-07-15 DIAGNOSIS — K4020 Bilateral inguinal hernia, without obstruction or gangrene, not specified as recurrent: Secondary | ICD-10-CM | POA: Insufficient documentation

## 2022-07-15 LAB — URINALYSIS, ROUTINE W REFLEX MICROSCOPIC
Bilirubin Urine: NEGATIVE
Hgb urine dipstick: NEGATIVE
Ketones, ur: NEGATIVE
Nitrite: NEGATIVE
Specific Gravity, Urine: <=1.005 — AB (ref 1.000–1.030)
Total Protein, Urine: NEGATIVE
Urine Glucose: NEGATIVE
Urobilinogen, UA: 0.2 (ref 0.0–1.0)
pH: 6.5 (ref 5.0–8.0)

## 2022-07-15 MED ORDER — SULFAMETHOXAZOLE-TRIMETHOPRIM 800-160 MG PO TABS: 1.0000 | ORAL_TABLET | Freq: Two times a day (BID) | ORAL | 0 refills | Status: AC

## 2022-07-15 NOTE — Assessment & Plan Note (Signed)
Mild to mod pain x 3 wks improving but with persistent LLE weakness mild - ok for pt to f/u with Sports Medicine, likely needs MRI but he states will f/u there

## 2022-07-15 NOTE — Assessment & Plan Note (Signed)
To begin hyperbaric tx soon - 40 tx per urology

## 2022-07-15 NOTE — Progress Notes (Signed)
Patient ID: Corey [name], male   DOB: [birth year], 71 y.o.   MRN: [id number]        Chief Complaint: follow up HTN, HLD and urinary frequency       HPI:  [name] is a 71 y.o. male here with c/o 2-3 days onset worsening urinary frequency again, was tx late April at ED visit for UTI, then had second antibx for quickly recurring symptoms with Dr Ronnald Ramp, now 2 mo later with similar symptoms start again - Denies urinary symptoms such as dysuria, urgency, flank pain, hematuria or n/v, fever, chills.  Has complicated hx of prostate ca, hx of radiation cystitis, recurring UTI, stress urinary incontinence, and renal cyst.  Next urology appt not able for several wks, but has hyperbaric tx for bladder starting soon.   Pt denies chest pain, increased sob or doe, wheezing, orthopnea, PND, increased LE swelling, palpitations, dizziness or syncope.   Pt denies polydipsia, polyuria, or new focal neuro s/s.   Incidentally, pt found out on arrival here that Dr Ronnald Ramp had graciously already rx antibx therapy.  He is wanting urine study today as well.  Does also have 3 wks onset incidental left sciatic neuritic pain mild to mod with worsening upper left leg weakness, has been trying to avoid MRI and evaluation recently but now willing.       Wt Readings from Last 3 Encounters:  07/15/22 194 lb 8 oz (88.2 kg)  05/07/22 205 lb (93 kg)  04/24/22 213 lb (96.6 kg)   BP Readings from Last 3 Encounters:  07/15/22 132/80  05/07/22 118/76  04/24/22 136/74         Past Medical History:  Diagnosis Date   Arthritis    "minor; elbows, wrists" (03/12/2017)   BPH (benign prostatic hyperplasia) 2011   Hypertension    Incisional hernia    Prostate cancer ([place]) 2016   S/P prostatectomy, TURP, radiation in 2017   Past Surgical History:  Procedure Laterality Date   [place] N/A 03/12/2017   Procedure: LAPAROSCOPIC [place];  Surgeon: Erroll Luna, MD;  Location: [place];   Service: General;  Laterality: N/A;   INGUINAL HERNIA REPAIR Right ~ 2007   INSERTION OF MESH N/A 03/12/2017   Procedure: INSERTION OF MESH;  Surgeon: Erroll Luna, MD;  Location: [place];  Service: General;  Laterality: N/A;   LAPAROSCOPIC INCISIONAL / UMBILICAL / [place]  03/12/2017   IHR w/mesh   PROSTATECTOMY  2017   TRANSURETHRAL RESECTION OF PROSTATE  2017   "after they removed my prostate"   UMBILICAL HERNIA REPAIR  ~ 2012    reports that he has never smoked. He has never been exposed to tobacco smoke. He has never used smokeless tobacco. He reports current alcohol use of about 5.0 standard drinks of alcohol per week. He reports that he does not use drugs. family history includes Alcohol abuse in his sister; Cancer in his mother; Early death in his sister. No Known Allergies Current Outpatient Medications on File Prior to Visit  Medication Sig Dispense Refill   amLODipine (NORVASC) 10 MG tablet Take 1 tablet (10 mg total) by mouth daily. 90 tablet 1   atorvastatin (LIPITOR) 10 MG tablet TAKE 1 TABLET BY MOUTH EVERY DAY 90 tablet 1   benazepril (LOTENSIN) 20 MG tablet Take 1 tablet (20 mg total) by mouth daily. 90 tablet 1   Multiple Vitamin (MULTIVITAMIN WITH MINERALS) TABS tablet Take 1 tablet  by mouth daily.     pioglitazone (ACTOS) 15 MG tablet TAKE 1 TABLET BY MOUTH EVERY DAY 90 tablet 1   No current facility-administered medications on file prior to visit.        ROS:  All others reviewed and negative.  Objective        PE:  BP 132/80   Pulse 71   Temp 98.3 F (36.8 C) (Oral)   Ht 5' 10"  (1.778 m)   Wt 194 lb 8 oz (88.2 kg)   SpO2 95%   BMI 27.91 kg/m                 Constitutional: Pt appears in NAD               HENT: Head: NCAT.                Right Ear: External ear normal.                 Left Ear: External ear normal.                Eyes: . Pupils are equal, round, and reactive to light. Conjunctivae and EOM are normal               Nose:  without d/c or deformity               Neck: Neck supple. Gross normal ROM               Cardiovascular: Normal rate and regular rhythm.                 Pulmonary/Chest: Effort normal and breath sounds without rales or wheezing.                Abd:  Soft, mild low mid abd tender, ND, + BS, no organomegaly               Neurological: Pt is alert. At baseline orientation, motor with mild distal left upper leg 4/5 motor               Skin: Skin is warm. No rashes, no other new lesions, LE edema - none               Psychiatric: Pt behavior is normal without agitation   Micro: none  Cardiac tracings I have personally interpreted today:  none  Pertinent Radiological findings (summarize): none   Lab Results  Component Value Date   WBC 6.6 05/05/2022   HGB 13.8 05/05/2022   HCT 40 (A) 05/05/2022   PLT 218 05/05/2022   GLUCOSE 86 10/22/2021   CHOL 135 10/22/2021   TRIG 59.0 10/22/2021   HDL 59.90 10/22/2021   LDLCALC 63 10/22/2021   ALT 34 05/05/2022   AST 72 (A) 05/05/2022   NA 135 (A) 05/05/2022   K 4.5 05/05/2022   CL 104 05/05/2022   CREATININE 1.2 05/05/2022   BUN 27 (A) 05/05/2022   CO2 19 05/05/2022   TSH 2.03 10/22/2021   PSA 0.08 (L) 10/22/2021   INR 1.0 10/22/2020   HGBA1C 5.6 09/17/2020   Assessment/Plan:  Atlas [place] is a 71 y.o. White or Caucasian [1] male with  has a past medical history of Arthritis, BPH (benign prostatic hyperplasia) (2011), Hypertension, Incisional hernia, and Prostate cancer ([place]) (2016).  Radiation cystitis To begin hyperbaric tx soon - 40 tx per urology  Left lumbar radiculopathy Mild to mod pain x 3 wks improving but  with persistent LLE weakness mild - ok for pt to f/u with Sports Medicine, likely needs MRI but he states will f/u there  Urinary frequency Mild to mod, non toxic, for urine culture, and ok to start antibx per Dr Ronnald Ramp already called in  Bilateral inguinal hernia asympt recurrent, pt made aware based on may 2023 Ct  abd/pelvis - pt will hold on further eval and tx for now  Essential hypertension, benign BP Readings from Last 3 Encounters:  07/15/22 132/80  05/07/22 118/76  04/24/22 136/74   Stable, pt to continue medical treatment norvasc 10 qd, lotensin 20 qd   Hyperlipidemia with target LDL less than 130 Lab Results  Component Value Date   LDLCALC 63 10/22/2021   Stable, pt to continue current statin liptior 10  Followup: No follow-ups on file.  Cathlean Cower, MD 07/19/2022 4:18 PM [facility]

## 2022-07-15 NOTE — Assessment & Plan Note (Signed)
asympt recurrent, pt made aware based on may 2023 Ct abd/pelvis - pt will hold on further eval and tx for now

## 2022-07-15 NOTE — Assessment & Plan Note (Signed)
Mild to mod, non toxic, for urine culture, and ok to start antibx per Dr Ronnald Ramp already called in

## 2022-07-15 NOTE — Telephone Encounter (Signed)
Please see the MyChart message reply(ies) for my assessment and plan.  The patient gave consent for this Medical Advice Message and is aware that it may result in a bill to their insurance company as well as the possibility that this may result in a co-payment or deductible. They are an established patient, but are not seeking medical advice exclusively about a problem treated during an in person or video visit in the last 7 days. I did not recommend an in person or video visit within 7 days of my reply.  I spent a total of 10 minutes cumulative time within 7 days through MyChart messaging Scarlette Calico, MD

## 2022-07-15 NOTE — Patient Instructions (Signed)
Please take all new medication as prescribed per Dr Ronnald Ramp   Please see Sports medicine on the first floor for your left lumbar radiculopathy (left leg weakness)  Please continue all other medications as before, and refills have been done if requested.  Please have the pharmacy call with any other refills you may need.  Please keep your appointments with your specialists as you may have planned

## 2022-07-16 LAB — URINE CULTURE: Result:: NO GROWTH

## 2022-07-19 NOTE — Assessment & Plan Note (Signed)
Lab Results  Component Value Date   LDLCALC 63 10/22/2021   Stable, pt to continue current statin liptior 10

## 2022-07-19 NOTE — Assessment & Plan Note (Signed)
BP Readings from Last 3 Encounters:  07/15/22 132/80  05/07/22 118/76  04/24/22 136/74   Stable, pt to continue medical treatment norvasc 10 qd, lotensin 20 qd

## 2022-07-22 DIAGNOSIS — E119 Type 2 diabetes mellitus without complications: Secondary | ICD-10-CM | POA: Diagnosis not present

## 2022-07-22 DIAGNOSIS — Z8546 Personal history of malignant neoplasm of prostate: Secondary | ICD-10-CM | POA: Diagnosis not present

## 2022-07-22 DIAGNOSIS — N3041 Irradiation cystitis with hematuria: Secondary | ICD-10-CM | POA: Diagnosis not present

## 2022-08-19 DIAGNOSIS — Z8546 Personal history of malignant neoplasm of prostate: Secondary | ICD-10-CM | POA: Diagnosis not present

## 2022-08-19 DIAGNOSIS — E119 Type 2 diabetes mellitus without complications: Secondary | ICD-10-CM | POA: Diagnosis not present

## 2022-08-19 DIAGNOSIS — N3041 Irradiation cystitis with hematuria: Secondary | ICD-10-CM | POA: Diagnosis not present

## 2022-08-20 DIAGNOSIS — E119 Type 2 diabetes mellitus without complications: Secondary | ICD-10-CM | POA: Diagnosis not present

## 2022-08-20 DIAGNOSIS — Z8546 Personal history of malignant neoplasm of prostate: Secondary | ICD-10-CM | POA: Diagnosis not present

## 2022-08-20 DIAGNOSIS — N3041 Irradiation cystitis with hematuria: Secondary | ICD-10-CM | POA: Diagnosis not present

## 2022-08-21 DIAGNOSIS — E119 Type 2 diabetes mellitus without complications: Secondary | ICD-10-CM | POA: Diagnosis not present

## 2022-08-21 DIAGNOSIS — Z8546 Personal history of malignant neoplasm of prostate: Secondary | ICD-10-CM | POA: Diagnosis not present

## 2022-08-21 DIAGNOSIS — N3041 Irradiation cystitis with hematuria: Secondary | ICD-10-CM | POA: Diagnosis not present

## 2022-08-22 DIAGNOSIS — N3041 Irradiation cystitis with hematuria: Secondary | ICD-10-CM | POA: Diagnosis not present

## 2022-08-22 DIAGNOSIS — H43812 Vitreous degeneration, left eye: Secondary | ICD-10-CM | POA: Diagnosis not present

## 2022-08-22 DIAGNOSIS — H353113 Nonexudative age-related macular degeneration, right eye, advanced atrophic without subfoveal involvement: Secondary | ICD-10-CM | POA: Diagnosis not present

## 2022-08-22 DIAGNOSIS — H353221 Exudative age-related macular degeneration, left eye, with active choroidal neovascularization: Secondary | ICD-10-CM | POA: Diagnosis not present

## 2022-08-22 DIAGNOSIS — E119 Type 2 diabetes mellitus without complications: Secondary | ICD-10-CM | POA: Diagnosis not present

## 2022-08-22 DIAGNOSIS — Z8546 Personal history of malignant neoplasm of prostate: Secondary | ICD-10-CM | POA: Diagnosis not present

## 2022-08-25 DIAGNOSIS — N3041 Irradiation cystitis with hematuria: Secondary | ICD-10-CM | POA: Diagnosis not present

## 2022-08-25 DIAGNOSIS — E119 Type 2 diabetes mellitus without complications: Secondary | ICD-10-CM | POA: Diagnosis not present

## 2022-08-25 DIAGNOSIS — Z8546 Personal history of malignant neoplasm of prostate: Secondary | ICD-10-CM | POA: Diagnosis not present

## 2022-08-26 DIAGNOSIS — N3041 Irradiation cystitis with hematuria: Secondary | ICD-10-CM | POA: Diagnosis not present

## 2022-08-26 DIAGNOSIS — E119 Type 2 diabetes mellitus without complications: Secondary | ICD-10-CM | POA: Diagnosis not present

## 2022-08-26 DIAGNOSIS — Z8546 Personal history of malignant neoplasm of prostate: Secondary | ICD-10-CM | POA: Diagnosis not present

## 2022-08-27 DIAGNOSIS — Z8546 Personal history of malignant neoplasm of prostate: Secondary | ICD-10-CM | POA: Diagnosis not present

## 2022-08-27 DIAGNOSIS — N3041 Irradiation cystitis with hematuria: Secondary | ICD-10-CM | POA: Diagnosis not present

## 2022-08-27 DIAGNOSIS — E119 Type 2 diabetes mellitus without complications: Secondary | ICD-10-CM | POA: Diagnosis not present

## 2022-08-28 DIAGNOSIS — Z8546 Personal history of malignant neoplasm of prostate: Secondary | ICD-10-CM | POA: Diagnosis not present

## 2022-08-28 DIAGNOSIS — N3041 Irradiation cystitis with hematuria: Secondary | ICD-10-CM | POA: Diagnosis not present

## 2022-08-28 DIAGNOSIS — E119 Type 2 diabetes mellitus without complications: Secondary | ICD-10-CM | POA: Diagnosis not present

## 2022-08-29 ENCOUNTER — Other Ambulatory Visit: Payer: Self-pay | Admitting: Internal Medicine

## 2022-08-29 DIAGNOSIS — N3041 Irradiation cystitis with hematuria: Secondary | ICD-10-CM | POA: Diagnosis not present

## 2022-08-29 DIAGNOSIS — E119 Type 2 diabetes mellitus without complications: Secondary | ICD-10-CM | POA: Diagnosis not present

## 2022-08-29 DIAGNOSIS — Z8546 Personal history of malignant neoplasm of prostate: Secondary | ICD-10-CM | POA: Diagnosis not present

## 2022-08-29 DIAGNOSIS — K7581 Nonalcoholic steatohepatitis (NASH): Secondary | ICD-10-CM

## 2022-08-29 DIAGNOSIS — E785 Hyperlipidemia, unspecified: Secondary | ICD-10-CM

## 2022-09-02 DIAGNOSIS — N3041 Irradiation cystitis with hematuria: Secondary | ICD-10-CM | POA: Diagnosis not present

## 2022-09-02 DIAGNOSIS — E119 Type 2 diabetes mellitus without complications: Secondary | ICD-10-CM | POA: Diagnosis not present

## 2022-09-02 DIAGNOSIS — Z8546 Personal history of malignant neoplasm of prostate: Secondary | ICD-10-CM | POA: Diagnosis not present

## 2022-09-03 ENCOUNTER — Encounter: Payer: Self-pay | Admitting: Internal Medicine

## 2022-09-03 ENCOUNTER — Other Ambulatory Visit: Payer: Self-pay | Admitting: Internal Medicine

## 2022-09-03 DIAGNOSIS — Z8546 Personal history of malignant neoplasm of prostate: Secondary | ICD-10-CM | POA: Diagnosis not present

## 2022-09-03 DIAGNOSIS — E119 Type 2 diabetes mellitus without complications: Secondary | ICD-10-CM | POA: Diagnosis not present

## 2022-09-03 DIAGNOSIS — N3041 Irradiation cystitis with hematuria: Secondary | ICD-10-CM | POA: Diagnosis not present

## 2022-09-04 DIAGNOSIS — E119 Type 2 diabetes mellitus without complications: Secondary | ICD-10-CM | POA: Diagnosis not present

## 2022-09-04 DIAGNOSIS — N3041 Irradiation cystitis with hematuria: Secondary | ICD-10-CM | POA: Diagnosis not present

## 2022-09-04 DIAGNOSIS — Z8546 Personal history of malignant neoplasm of prostate: Secondary | ICD-10-CM | POA: Diagnosis not present

## 2022-09-05 DIAGNOSIS — N3041 Irradiation cystitis with hematuria: Secondary | ICD-10-CM | POA: Diagnosis not present

## 2022-09-05 DIAGNOSIS — Z8546 Personal history of malignant neoplasm of prostate: Secondary | ICD-10-CM | POA: Diagnosis not present

## 2022-09-05 DIAGNOSIS — E119 Type 2 diabetes mellitus without complications: Secondary | ICD-10-CM | POA: Diagnosis not present

## 2022-09-08 DIAGNOSIS — R310 Gross hematuria: Secondary | ICD-10-CM | POA: Diagnosis not present

## 2022-09-08 DIAGNOSIS — R82998 Other abnormal findings in urine: Secondary | ICD-10-CM | POA: Diagnosis not present

## 2022-09-08 DIAGNOSIS — D7289 Other specified disorders of white blood cells: Secondary | ICD-10-CM | POA: Diagnosis not present

## 2022-09-08 DIAGNOSIS — N3041 Irradiation cystitis with hematuria: Secondary | ICD-10-CM | POA: Diagnosis not present

## 2022-09-08 DIAGNOSIS — E119 Type 2 diabetes mellitus without complications: Secondary | ICD-10-CM | POA: Diagnosis not present

## 2022-09-08 DIAGNOSIS — N393 Stress incontinence (female) (male): Secondary | ICD-10-CM | POA: Diagnosis not present

## 2022-09-08 DIAGNOSIS — R102 Pelvic and perineal pain: Secondary | ICD-10-CM | POA: Diagnosis not present

## 2022-09-08 DIAGNOSIS — Z8546 Personal history of malignant neoplasm of prostate: Secondary | ICD-10-CM | POA: Diagnosis not present

## 2022-09-08 DIAGNOSIS — R3989 Other symptoms and signs involving the genitourinary system: Secondary | ICD-10-CM | POA: Diagnosis not present

## 2022-09-08 DIAGNOSIS — R800 Isolated proteinuria: Secondary | ICD-10-CM | POA: Diagnosis not present

## 2022-09-09 DIAGNOSIS — N3041 Irradiation cystitis with hematuria: Secondary | ICD-10-CM | POA: Diagnosis not present

## 2022-09-09 DIAGNOSIS — Z8546 Personal history of malignant neoplasm of prostate: Secondary | ICD-10-CM | POA: Diagnosis not present

## 2022-09-09 DIAGNOSIS — E119 Type 2 diabetes mellitus without complications: Secondary | ICD-10-CM | POA: Diagnosis not present

## 2022-09-10 DIAGNOSIS — Z8546 Personal history of malignant neoplasm of prostate: Secondary | ICD-10-CM | POA: Diagnosis not present

## 2022-09-10 DIAGNOSIS — N3041 Irradiation cystitis with hematuria: Secondary | ICD-10-CM | POA: Diagnosis not present

## 2022-09-10 DIAGNOSIS — E119 Type 2 diabetes mellitus without complications: Secondary | ICD-10-CM | POA: Diagnosis not present

## 2022-09-11 DIAGNOSIS — N3041 Irradiation cystitis with hematuria: Secondary | ICD-10-CM | POA: Diagnosis not present

## 2022-09-11 DIAGNOSIS — Z8546 Personal history of malignant neoplasm of prostate: Secondary | ICD-10-CM | POA: Diagnosis not present

## 2022-09-11 DIAGNOSIS — E119 Type 2 diabetes mellitus without complications: Secondary | ICD-10-CM | POA: Diagnosis not present

## 2022-09-12 DIAGNOSIS — N3041 Irradiation cystitis with hematuria: Secondary | ICD-10-CM | POA: Diagnosis not present

## 2022-09-12 DIAGNOSIS — E119 Type 2 diabetes mellitus without complications: Secondary | ICD-10-CM | POA: Diagnosis not present

## 2022-09-12 DIAGNOSIS — Z8546 Personal history of malignant neoplasm of prostate: Secondary | ICD-10-CM | POA: Diagnosis not present

## 2022-09-15 DIAGNOSIS — E119 Type 2 diabetes mellitus without complications: Secondary | ICD-10-CM | POA: Diagnosis not present

## 2022-09-15 DIAGNOSIS — N3041 Irradiation cystitis with hematuria: Secondary | ICD-10-CM | POA: Diagnosis not present

## 2022-09-15 DIAGNOSIS — Z8546 Personal history of malignant neoplasm of prostate: Secondary | ICD-10-CM | POA: Diagnosis not present

## 2022-09-16 DIAGNOSIS — Z8546 Personal history of malignant neoplasm of prostate: Secondary | ICD-10-CM | POA: Diagnosis not present

## 2022-09-16 DIAGNOSIS — N3041 Irradiation cystitis with hematuria: Secondary | ICD-10-CM | POA: Diagnosis not present

## 2022-09-16 DIAGNOSIS — E119 Type 2 diabetes mellitus without complications: Secondary | ICD-10-CM | POA: Diagnosis not present

## 2022-09-17 DIAGNOSIS — Z8546 Personal history of malignant neoplasm of prostate: Secondary | ICD-10-CM | POA: Diagnosis not present

## 2022-09-17 DIAGNOSIS — E119 Type 2 diabetes mellitus without complications: Secondary | ICD-10-CM | POA: Diagnosis not present

## 2022-09-17 DIAGNOSIS — N3041 Irradiation cystitis with hematuria: Secondary | ICD-10-CM | POA: Diagnosis not present

## 2022-09-18 DIAGNOSIS — E119 Type 2 diabetes mellitus without complications: Secondary | ICD-10-CM | POA: Diagnosis not present

## 2022-09-18 DIAGNOSIS — Z8546 Personal history of malignant neoplasm of prostate: Secondary | ICD-10-CM | POA: Diagnosis not present

## 2022-09-18 DIAGNOSIS — N3041 Irradiation cystitis with hematuria: Secondary | ICD-10-CM | POA: Diagnosis not present

## 2022-09-19 DIAGNOSIS — Z8546 Personal history of malignant neoplasm of prostate: Secondary | ICD-10-CM | POA: Diagnosis not present

## 2022-09-19 DIAGNOSIS — E119 Type 2 diabetes mellitus without complications: Secondary | ICD-10-CM | POA: Diagnosis not present

## 2022-09-19 DIAGNOSIS — N3041 Irradiation cystitis with hematuria: Secondary | ICD-10-CM | POA: Diagnosis not present

## 2022-09-22 DIAGNOSIS — Z8546 Personal history of malignant neoplasm of prostate: Secondary | ICD-10-CM | POA: Diagnosis not present

## 2022-09-22 DIAGNOSIS — N3041 Irradiation cystitis with hematuria: Secondary | ICD-10-CM | POA: Diagnosis not present

## 2022-09-22 DIAGNOSIS — E119 Type 2 diabetes mellitus without complications: Secondary | ICD-10-CM | POA: Diagnosis not present

## 2022-09-23 DIAGNOSIS — Z8546 Personal history of malignant neoplasm of prostate: Secondary | ICD-10-CM | POA: Diagnosis not present

## 2022-09-23 DIAGNOSIS — E119 Type 2 diabetes mellitus without complications: Secondary | ICD-10-CM | POA: Diagnosis not present

## 2022-09-23 DIAGNOSIS — N3041 Irradiation cystitis with hematuria: Secondary | ICD-10-CM | POA: Diagnosis not present

## 2022-09-24 DIAGNOSIS — N3041 Irradiation cystitis with hematuria: Secondary | ICD-10-CM | POA: Diagnosis not present

## 2022-09-24 DIAGNOSIS — E119 Type 2 diabetes mellitus without complications: Secondary | ICD-10-CM | POA: Diagnosis not present

## 2022-09-25 DIAGNOSIS — N3041 Irradiation cystitis with hematuria: Secondary | ICD-10-CM | POA: Diagnosis not present

## 2022-09-25 DIAGNOSIS — E119 Type 2 diabetes mellitus without complications: Secondary | ICD-10-CM | POA: Diagnosis not present

## 2022-09-26 DIAGNOSIS — E119 Type 2 diabetes mellitus without complications: Secondary | ICD-10-CM | POA: Diagnosis not present

## 2022-09-26 DIAGNOSIS — N3041 Irradiation cystitis with hematuria: Secondary | ICD-10-CM | POA: Diagnosis not present

## 2022-09-26 DIAGNOSIS — Z8546 Personal history of malignant neoplasm of prostate: Secondary | ICD-10-CM | POA: Diagnosis not present

## 2022-09-29 DIAGNOSIS — Z8546 Personal history of malignant neoplasm of prostate: Secondary | ICD-10-CM | POA: Diagnosis not present

## 2022-09-29 DIAGNOSIS — E119 Type 2 diabetes mellitus without complications: Secondary | ICD-10-CM | POA: Diagnosis not present

## 2022-09-29 DIAGNOSIS — N3041 Irradiation cystitis with hematuria: Secondary | ICD-10-CM | POA: Diagnosis not present

## 2022-09-30 DIAGNOSIS — N3041 Irradiation cystitis with hematuria: Secondary | ICD-10-CM | POA: Diagnosis not present

## 2022-09-30 DIAGNOSIS — Y842 Radiological procedure and radiotherapy as the cause of abnormal reaction of the patient, or of later complication, without mention of misadventure at the time of the procedure: Secondary | ICD-10-CM | POA: Diagnosis not present

## 2022-09-30 DIAGNOSIS — E119 Type 2 diabetes mellitus without complications: Secondary | ICD-10-CM | POA: Diagnosis not present

## 2022-09-30 DIAGNOSIS — Z8546 Personal history of malignant neoplasm of prostate: Secondary | ICD-10-CM | POA: Diagnosis not present

## 2022-10-01 DIAGNOSIS — Z8546 Personal history of malignant neoplasm of prostate: Secondary | ICD-10-CM | POA: Diagnosis not present

## 2022-10-01 DIAGNOSIS — E119 Type 2 diabetes mellitus without complications: Secondary | ICD-10-CM | POA: Diagnosis not present

## 2022-10-01 DIAGNOSIS — N3041 Irradiation cystitis with hematuria: Secondary | ICD-10-CM | POA: Diagnosis not present

## 2022-10-02 DIAGNOSIS — N3041 Irradiation cystitis with hematuria: Secondary | ICD-10-CM | POA: Diagnosis not present

## 2022-10-02 DIAGNOSIS — E119 Type 2 diabetes mellitus without complications: Secondary | ICD-10-CM | POA: Diagnosis not present

## 2022-10-02 DIAGNOSIS — Z8546 Personal history of malignant neoplasm of prostate: Secondary | ICD-10-CM | POA: Diagnosis not present

## 2022-10-03 DIAGNOSIS — N3041 Irradiation cystitis with hematuria: Secondary | ICD-10-CM | POA: Diagnosis not present

## 2022-10-03 DIAGNOSIS — E119 Type 2 diabetes mellitus without complications: Secondary | ICD-10-CM | POA: Diagnosis not present

## 2022-10-03 DIAGNOSIS — Z8546 Personal history of malignant neoplasm of prostate: Secondary | ICD-10-CM | POA: Diagnosis not present

## 2022-10-06 DIAGNOSIS — H353113 Nonexudative age-related macular degeneration, right eye, advanced atrophic without subfoveal involvement: Secondary | ICD-10-CM | POA: Diagnosis not present

## 2022-10-06 DIAGNOSIS — N3041 Irradiation cystitis with hematuria: Secondary | ICD-10-CM | POA: Diagnosis not present

## 2022-10-06 DIAGNOSIS — Z8546 Personal history of malignant neoplasm of prostate: Secondary | ICD-10-CM | POA: Diagnosis not present

## 2022-10-06 DIAGNOSIS — E119 Type 2 diabetes mellitus without complications: Secondary | ICD-10-CM | POA: Diagnosis not present

## 2022-10-06 DIAGNOSIS — H353221 Exudative age-related macular degeneration, left eye, with active choroidal neovascularization: Secondary | ICD-10-CM | POA: Diagnosis not present

## 2022-10-07 DIAGNOSIS — R102 Pelvic and perineal pain: Secondary | ICD-10-CM | POA: Diagnosis not present

## 2022-10-07 DIAGNOSIS — E119 Type 2 diabetes mellitus without complications: Secondary | ICD-10-CM | POA: Diagnosis not present

## 2022-10-07 DIAGNOSIS — N393 Stress incontinence (female) (male): Secondary | ICD-10-CM | POA: Diagnosis not present

## 2022-10-07 DIAGNOSIS — R82998 Other abnormal findings in urine: Secondary | ICD-10-CM | POA: Diagnosis not present

## 2022-10-07 DIAGNOSIS — R3989 Other symptoms and signs involving the genitourinary system: Secondary | ICD-10-CM | POA: Diagnosis not present

## 2022-10-07 DIAGNOSIS — R800 Isolated proteinuria: Secondary | ICD-10-CM | POA: Diagnosis not present

## 2022-10-07 DIAGNOSIS — N3041 Irradiation cystitis with hematuria: Secondary | ICD-10-CM | POA: Diagnosis not present

## 2022-10-07 DIAGNOSIS — R310 Gross hematuria: Secondary | ICD-10-CM | POA: Diagnosis not present

## 2022-10-07 DIAGNOSIS — Z8546 Personal history of malignant neoplasm of prostate: Secondary | ICD-10-CM | POA: Diagnosis not present

## 2022-10-08 DIAGNOSIS — E119 Type 2 diabetes mellitus without complications: Secondary | ICD-10-CM | POA: Diagnosis not present

## 2022-10-08 DIAGNOSIS — Z8546 Personal history of malignant neoplasm of prostate: Secondary | ICD-10-CM | POA: Diagnosis not present

## 2022-10-08 DIAGNOSIS — N3041 Irradiation cystitis with hematuria: Secondary | ICD-10-CM | POA: Diagnosis not present

## 2022-10-09 DIAGNOSIS — E119 Type 2 diabetes mellitus without complications: Secondary | ICD-10-CM | POA: Diagnosis not present

## 2022-10-09 DIAGNOSIS — N3041 Irradiation cystitis with hematuria: Secondary | ICD-10-CM | POA: Diagnosis not present

## 2022-10-09 DIAGNOSIS — Z8546 Personal history of malignant neoplasm of prostate: Secondary | ICD-10-CM | POA: Diagnosis not present

## 2022-10-13 DIAGNOSIS — E119 Type 2 diabetes mellitus without complications: Secondary | ICD-10-CM | POA: Diagnosis not present

## 2022-10-13 DIAGNOSIS — N3041 Irradiation cystitis with hematuria: Secondary | ICD-10-CM | POA: Diagnosis not present

## 2022-10-13 DIAGNOSIS — Z8546 Personal history of malignant neoplasm of prostate: Secondary | ICD-10-CM | POA: Diagnosis not present

## 2022-10-14 DIAGNOSIS — N3041 Irradiation cystitis with hematuria: Secondary | ICD-10-CM | POA: Diagnosis not present

## 2022-10-14 DIAGNOSIS — Z8546 Personal history of malignant neoplasm of prostate: Secondary | ICD-10-CM | POA: Diagnosis not present

## 2022-10-14 DIAGNOSIS — E119 Type 2 diabetes mellitus without complications: Secondary | ICD-10-CM | POA: Diagnosis not present

## 2022-10-15 DIAGNOSIS — Z8546 Personal history of malignant neoplasm of prostate: Secondary | ICD-10-CM | POA: Diagnosis not present

## 2022-10-15 DIAGNOSIS — E119 Type 2 diabetes mellitus without complications: Secondary | ICD-10-CM | POA: Diagnosis not present

## 2022-10-15 DIAGNOSIS — N3041 Irradiation cystitis with hematuria: Secondary | ICD-10-CM | POA: Diagnosis not present

## 2022-10-16 DIAGNOSIS — N3041 Irradiation cystitis with hematuria: Secondary | ICD-10-CM | POA: Diagnosis not present

## 2022-10-16 DIAGNOSIS — E119 Type 2 diabetes mellitus without complications: Secondary | ICD-10-CM | POA: Diagnosis not present

## 2022-10-16 DIAGNOSIS — Z8546 Personal history of malignant neoplasm of prostate: Secondary | ICD-10-CM | POA: Diagnosis not present

## 2022-10-17 ENCOUNTER — Other Ambulatory Visit: Payer: Self-pay | Admitting: Internal Medicine

## 2022-10-17 DIAGNOSIS — Z8546 Personal history of malignant neoplasm of prostate: Secondary | ICD-10-CM | POA: Diagnosis not present

## 2022-10-17 DIAGNOSIS — N3041 Irradiation cystitis with hematuria: Secondary | ICD-10-CM | POA: Diagnosis not present

## 2022-10-17 DIAGNOSIS — E119 Type 2 diabetes mellitus without complications: Secondary | ICD-10-CM | POA: Diagnosis not present

## 2022-10-17 DIAGNOSIS — I10 Essential (primary) hypertension: Secondary | ICD-10-CM

## 2022-10-20 DIAGNOSIS — E119 Type 2 diabetes mellitus without complications: Secondary | ICD-10-CM | POA: Diagnosis not present

## 2022-10-20 DIAGNOSIS — N3041 Irradiation cystitis with hematuria: Secondary | ICD-10-CM | POA: Diagnosis not present

## 2022-10-20 DIAGNOSIS — Z8546 Personal history of malignant neoplasm of prostate: Secondary | ICD-10-CM | POA: Diagnosis not present

## 2022-10-21 DIAGNOSIS — N3041 Irradiation cystitis with hematuria: Secondary | ICD-10-CM | POA: Diagnosis not present

## 2022-10-21 DIAGNOSIS — E119 Type 2 diabetes mellitus without complications: Secondary | ICD-10-CM | POA: Diagnosis not present

## 2022-10-21 DIAGNOSIS — Z8546 Personal history of malignant neoplasm of prostate: Secondary | ICD-10-CM | POA: Diagnosis not present

## 2022-10-22 DIAGNOSIS — E119 Type 2 diabetes mellitus without complications: Secondary | ICD-10-CM | POA: Diagnosis not present

## 2022-10-22 DIAGNOSIS — Z8546 Personal history of malignant neoplasm of prostate: Secondary | ICD-10-CM | POA: Diagnosis not present

## 2022-10-22 DIAGNOSIS — N3041 Irradiation cystitis with hematuria: Secondary | ICD-10-CM | POA: Diagnosis not present

## 2022-10-23 DIAGNOSIS — N3041 Irradiation cystitis with hematuria: Secondary | ICD-10-CM | POA: Diagnosis not present

## 2022-10-23 DIAGNOSIS — Z8546 Personal history of malignant neoplasm of prostate: Secondary | ICD-10-CM | POA: Diagnosis not present

## 2022-10-23 DIAGNOSIS — E119 Type 2 diabetes mellitus without complications: Secondary | ICD-10-CM | POA: Diagnosis not present

## 2022-10-24 DIAGNOSIS — N3041 Irradiation cystitis with hematuria: Secondary | ICD-10-CM | POA: Diagnosis not present

## 2022-10-24 DIAGNOSIS — Z8546 Personal history of malignant neoplasm of prostate: Secondary | ICD-10-CM | POA: Diagnosis not present

## 2022-10-24 DIAGNOSIS — E119 Type 2 diabetes mellitus without complications: Secondary | ICD-10-CM | POA: Diagnosis not present

## 2022-10-27 DIAGNOSIS — E119 Type 2 diabetes mellitus without complications: Secondary | ICD-10-CM | POA: Diagnosis not present

## 2022-10-27 DIAGNOSIS — Z8546 Personal history of malignant neoplasm of prostate: Secondary | ICD-10-CM | POA: Diagnosis not present

## 2022-10-27 DIAGNOSIS — N3041 Irradiation cystitis with hematuria: Secondary | ICD-10-CM | POA: Diagnosis not present

## 2022-10-28 ENCOUNTER — Ambulatory Visit: Payer: Medicare HMO | Admitting: Internal Medicine

## 2022-10-28 DIAGNOSIS — E119 Type 2 diabetes mellitus without complications: Secondary | ICD-10-CM | POA: Diagnosis not present

## 2022-10-28 DIAGNOSIS — Z8546 Personal history of malignant neoplasm of prostate: Secondary | ICD-10-CM | POA: Diagnosis not present

## 2022-10-28 DIAGNOSIS — N3041 Irradiation cystitis with hematuria: Secondary | ICD-10-CM | POA: Diagnosis not present

## 2022-10-29 DIAGNOSIS — E119 Type 2 diabetes mellitus without complications: Secondary | ICD-10-CM | POA: Diagnosis not present

## 2022-10-29 DIAGNOSIS — Y842 Radiological procedure and radiotherapy as the cause of abnormal reaction of the patient, or of later complication, without mention of misadventure at the time of the procedure: Secondary | ICD-10-CM | POA: Diagnosis not present

## 2022-10-29 DIAGNOSIS — Z8546 Personal history of malignant neoplasm of prostate: Secondary | ICD-10-CM | POA: Diagnosis not present

## 2022-10-29 DIAGNOSIS — N3041 Irradiation cystitis with hematuria: Secondary | ICD-10-CM | POA: Diagnosis not present

## 2022-10-30 DIAGNOSIS — Y842 Radiological procedure and radiotherapy as the cause of abnormal reaction of the patient, or of later complication, without mention of misadventure at the time of the procedure: Secondary | ICD-10-CM | POA: Diagnosis not present

## 2022-10-30 DIAGNOSIS — N3041 Irradiation cystitis with hematuria: Secondary | ICD-10-CM | POA: Diagnosis not present

## 2022-10-30 DIAGNOSIS — Z8546 Personal history of malignant neoplasm of prostate: Secondary | ICD-10-CM | POA: Diagnosis not present

## 2022-10-30 DIAGNOSIS — E119 Type 2 diabetes mellitus without complications: Secondary | ICD-10-CM | POA: Diagnosis not present

## 2022-10-31 DIAGNOSIS — Z8546 Personal history of malignant neoplasm of prostate: Secondary | ICD-10-CM | POA: Diagnosis not present

## 2022-10-31 DIAGNOSIS — N3041 Irradiation cystitis with hematuria: Secondary | ICD-10-CM | POA: Diagnosis not present

## 2022-10-31 DIAGNOSIS — E119 Type 2 diabetes mellitus without complications: Secondary | ICD-10-CM | POA: Diagnosis not present

## 2022-10-31 DIAGNOSIS — Y842 Radiological procedure and radiotherapy as the cause of abnormal reaction of the patient, or of later complication, without mention of misadventure at the time of the procedure: Secondary | ICD-10-CM | POA: Diagnosis not present

## 2022-11-03 DIAGNOSIS — N3041 Irradiation cystitis with hematuria: Secondary | ICD-10-CM | POA: Diagnosis not present

## 2022-11-03 DIAGNOSIS — E119 Type 2 diabetes mellitus without complications: Secondary | ICD-10-CM | POA: Diagnosis not present

## 2022-11-03 DIAGNOSIS — Z8546 Personal history of malignant neoplasm of prostate: Secondary | ICD-10-CM | POA: Diagnosis not present

## 2022-11-03 DIAGNOSIS — Y842 Radiological procedure and radiotherapy as the cause of abnormal reaction of the patient, or of later complication, without mention of misadventure at the time of the procedure: Secondary | ICD-10-CM | POA: Diagnosis not present

## 2022-11-04 DIAGNOSIS — Z8546 Personal history of malignant neoplasm of prostate: Secondary | ICD-10-CM | POA: Diagnosis not present

## 2022-11-04 DIAGNOSIS — Y842 Radiological procedure and radiotherapy as the cause of abnormal reaction of the patient, or of later complication, without mention of misadventure at the time of the procedure: Secondary | ICD-10-CM | POA: Diagnosis not present

## 2022-11-04 DIAGNOSIS — E119 Type 2 diabetes mellitus without complications: Secondary | ICD-10-CM | POA: Diagnosis not present

## 2022-11-04 DIAGNOSIS — N3041 Irradiation cystitis with hematuria: Secondary | ICD-10-CM | POA: Diagnosis not present

## 2022-11-05 DIAGNOSIS — Z8546 Personal history of malignant neoplasm of prostate: Secondary | ICD-10-CM | POA: Diagnosis not present

## 2022-11-05 DIAGNOSIS — N3041 Irradiation cystitis with hematuria: Secondary | ICD-10-CM | POA: Diagnosis not present

## 2022-11-05 DIAGNOSIS — E119 Type 2 diabetes mellitus without complications: Secondary | ICD-10-CM | POA: Diagnosis not present

## 2022-11-05 DIAGNOSIS — Y842 Radiological procedure and radiotherapy as the cause of abnormal reaction of the patient, or of later complication, without mention of misadventure at the time of the procedure: Secondary | ICD-10-CM | POA: Diagnosis not present

## 2022-11-06 DIAGNOSIS — N3041 Irradiation cystitis with hematuria: Secondary | ICD-10-CM | POA: Diagnosis not present

## 2022-11-06 DIAGNOSIS — E119 Type 2 diabetes mellitus without complications: Secondary | ICD-10-CM | POA: Diagnosis not present

## 2022-11-06 DIAGNOSIS — Z8546 Personal history of malignant neoplasm of prostate: Secondary | ICD-10-CM | POA: Diagnosis not present

## 2022-11-06 DIAGNOSIS — Y842 Radiological procedure and radiotherapy as the cause of abnormal reaction of the patient, or of later complication, without mention of misadventure at the time of the procedure: Secondary | ICD-10-CM | POA: Diagnosis not present

## 2022-11-07 DIAGNOSIS — Z8546 Personal history of malignant neoplasm of prostate: Secondary | ICD-10-CM | POA: Diagnosis not present

## 2022-11-07 DIAGNOSIS — N3041 Irradiation cystitis with hematuria: Secondary | ICD-10-CM | POA: Diagnosis not present

## 2022-11-07 DIAGNOSIS — E119 Type 2 diabetes mellitus without complications: Secondary | ICD-10-CM | POA: Diagnosis not present

## 2022-11-07 DIAGNOSIS — Y842 Radiological procedure and radiotherapy as the cause of abnormal reaction of the patient, or of later complication, without mention of misadventure at the time of the procedure: Secondary | ICD-10-CM | POA: Diagnosis not present

## 2022-11-10 DIAGNOSIS — Z8546 Personal history of malignant neoplasm of prostate: Secondary | ICD-10-CM | POA: Diagnosis not present

## 2022-11-10 DIAGNOSIS — E119 Type 2 diabetes mellitus without complications: Secondary | ICD-10-CM | POA: Diagnosis not present

## 2022-11-10 DIAGNOSIS — N3041 Irradiation cystitis with hematuria: Secondary | ICD-10-CM | POA: Diagnosis not present

## 2022-11-10 DIAGNOSIS — Y842 Radiological procedure and radiotherapy as the cause of abnormal reaction of the patient, or of later complication, without mention of misadventure at the time of the procedure: Secondary | ICD-10-CM | POA: Diagnosis not present

## 2022-11-11 DIAGNOSIS — Z8546 Personal history of malignant neoplasm of prostate: Secondary | ICD-10-CM | POA: Diagnosis not present

## 2022-11-11 DIAGNOSIS — E119 Type 2 diabetes mellitus without complications: Secondary | ICD-10-CM | POA: Diagnosis not present

## 2022-11-11 DIAGNOSIS — Y842 Radiological procedure and radiotherapy as the cause of abnormal reaction of the patient, or of later complication, without mention of misadventure at the time of the procedure: Secondary | ICD-10-CM | POA: Diagnosis not present

## 2022-11-11 DIAGNOSIS — N3041 Irradiation cystitis with hematuria: Secondary | ICD-10-CM | POA: Diagnosis not present

## 2022-11-12 DIAGNOSIS — E119 Type 2 diabetes mellitus without complications: Secondary | ICD-10-CM | POA: Diagnosis not present

## 2022-11-12 DIAGNOSIS — N3041 Irradiation cystitis with hematuria: Secondary | ICD-10-CM | POA: Diagnosis not present

## 2022-11-12 DIAGNOSIS — Y842 Radiological procedure and radiotherapy as the cause of abnormal reaction of the patient, or of later complication, without mention of misadventure at the time of the procedure: Secondary | ICD-10-CM | POA: Diagnosis not present

## 2022-11-12 DIAGNOSIS — Z8546 Personal history of malignant neoplasm of prostate: Secondary | ICD-10-CM | POA: Diagnosis not present

## 2022-11-13 DIAGNOSIS — R310 Gross hematuria: Secondary | ICD-10-CM | POA: Diagnosis not present

## 2022-11-13 DIAGNOSIS — N393 Stress incontinence (female) (male): Secondary | ICD-10-CM | POA: Diagnosis not present

## 2022-11-13 DIAGNOSIS — N281 Cyst of kidney, acquired: Secondary | ICD-10-CM | POA: Diagnosis not present

## 2022-11-13 DIAGNOSIS — R102 Pelvic and perineal pain: Secondary | ICD-10-CM | POA: Diagnosis not present

## 2022-11-13 DIAGNOSIS — N320 Bladder-neck obstruction: Secondary | ICD-10-CM | POA: Diagnosis not present

## 2022-11-13 DIAGNOSIS — N5231 Erectile dysfunction following radical prostatectomy: Secondary | ICD-10-CM | POA: Diagnosis not present

## 2022-11-13 DIAGNOSIS — N3040 Irradiation cystitis without hematuria: Secondary | ICD-10-CM | POA: Diagnosis not present

## 2022-11-13 DIAGNOSIS — R3989 Other symptoms and signs involving the genitourinary system: Secondary | ICD-10-CM | POA: Diagnosis not present

## 2022-11-13 DIAGNOSIS — Z8546 Personal history of malignant neoplasm of prostate: Secondary | ICD-10-CM | POA: Diagnosis not present

## 2022-11-17 DIAGNOSIS — H353221 Exudative age-related macular degeneration, left eye, with active choroidal neovascularization: Secondary | ICD-10-CM | POA: Diagnosis not present

## 2022-11-17 DIAGNOSIS — H353113 Nonexudative age-related macular degeneration, right eye, advanced atrophic without subfoveal involvement: Secondary | ICD-10-CM | POA: Diagnosis not present

## 2022-11-17 DIAGNOSIS — H43812 Vitreous degeneration, left eye: Secondary | ICD-10-CM | POA: Diagnosis not present

## 2022-11-25 ENCOUNTER — Telehealth: Payer: Medicare HMO

## 2022-12-13 DIAGNOSIS — R339 Retention of urine, unspecified: Secondary | ICD-10-CM | POA: Diagnosis not present

## 2022-12-13 DIAGNOSIS — I10 Essential (primary) hypertension: Secondary | ICD-10-CM | POA: Diagnosis not present

## 2022-12-13 DIAGNOSIS — Z923 Personal history of irradiation: Secondary | ICD-10-CM | POA: Diagnosis not present

## 2022-12-13 DIAGNOSIS — Z8546 Personal history of malignant neoplasm of prostate: Secondary | ICD-10-CM | POA: Diagnosis not present

## 2022-12-13 DIAGNOSIS — R300 Dysuria: Secondary | ICD-10-CM | POA: Diagnosis not present

## 2022-12-13 DIAGNOSIS — R1030 Lower abdominal pain, unspecified: Secondary | ICD-10-CM | POA: Diagnosis not present

## 2022-12-13 DIAGNOSIS — Z9079 Acquired absence of other genital organ(s): Secondary | ICD-10-CM | POA: Diagnosis not present

## 2022-12-13 DIAGNOSIS — R310 Gross hematuria: Secondary | ICD-10-CM | POA: Diagnosis not present

## 2022-12-14 DIAGNOSIS — R310 Gross hematuria: Secondary | ICD-10-CM | POA: Diagnosis not present

## 2022-12-15 DIAGNOSIS — R310 Gross hematuria: Secondary | ICD-10-CM | POA: Diagnosis not present

## 2022-12-15 DIAGNOSIS — R338 Other retention of urine: Secondary | ICD-10-CM | POA: Diagnosis not present

## 2022-12-15 DIAGNOSIS — R339 Retention of urine, unspecified: Secondary | ICD-10-CM | POA: Diagnosis not present

## 2022-12-19 DIAGNOSIS — N3041 Irradiation cystitis with hematuria: Secondary | ICD-10-CM | POA: Diagnosis not present

## 2022-12-19 DIAGNOSIS — N281 Cyst of kidney, acquired: Secondary | ICD-10-CM | POA: Diagnosis not present

## 2022-12-19 DIAGNOSIS — N393 Stress incontinence (female) (male): Secondary | ICD-10-CM | POA: Diagnosis not present

## 2022-12-19 DIAGNOSIS — Z8546 Personal history of malignant neoplasm of prostate: Secondary | ICD-10-CM | POA: Diagnosis not present

## 2022-12-19 DIAGNOSIS — N5231 Erectile dysfunction following radical prostatectomy: Secondary | ICD-10-CM | POA: Diagnosis not present

## 2023-01-06 DIAGNOSIS — M5386 Other specified dorsopathies, lumbar region: Secondary | ICD-10-CM | POA: Diagnosis not present

## 2023-01-06 DIAGNOSIS — R531 Weakness: Secondary | ICD-10-CM | POA: Diagnosis not present

## 2023-01-06 DIAGNOSIS — M62838 Other muscle spasm: Secondary | ICD-10-CM | POA: Diagnosis not present

## 2023-01-06 DIAGNOSIS — M25652 Stiffness of left hip, not elsewhere classified: Secondary | ICD-10-CM | POA: Diagnosis not present

## 2023-01-06 DIAGNOSIS — M25651 Stiffness of right hip, not elsewhere classified: Secondary | ICD-10-CM | POA: Diagnosis not present

## 2023-01-06 DIAGNOSIS — R102 Pelvic and perineal pain: Secondary | ICD-10-CM | POA: Diagnosis not present

## 2023-01-08 DIAGNOSIS — H353221 Exudative age-related macular degeneration, left eye, with active choroidal neovascularization: Secondary | ICD-10-CM | POA: Diagnosis not present

## 2023-01-08 DIAGNOSIS — H43821 Vitreomacular adhesion, right eye: Secondary | ICD-10-CM | POA: Diagnosis not present

## 2023-01-08 DIAGNOSIS — H43812 Vitreous degeneration, left eye: Secondary | ICD-10-CM | POA: Diagnosis not present

## 2023-01-08 DIAGNOSIS — H353113 Nonexudative age-related macular degeneration, right eye, advanced atrophic without subfoveal involvement: Secondary | ICD-10-CM | POA: Diagnosis not present

## 2023-01-08 DIAGNOSIS — H35033 Hypertensive retinopathy, bilateral: Secondary | ICD-10-CM | POA: Diagnosis not present

## 2023-01-14 ENCOUNTER — Other Ambulatory Visit: Payer: Self-pay | Admitting: Internal Medicine

## 2023-01-14 DIAGNOSIS — I10 Essential (primary) hypertension: Secondary | ICD-10-CM

## 2023-02-27 ENCOUNTER — Other Ambulatory Visit: Payer: Self-pay | Admitting: Internal Medicine

## 2023-02-27 DIAGNOSIS — E785 Hyperlipidemia, unspecified: Secondary | ICD-10-CM

## 2023-02-27 DIAGNOSIS — K7581 Nonalcoholic steatohepatitis (NASH): Secondary | ICD-10-CM

## 2023-02-27 MED ORDER — ATORVASTATIN CALCIUM 10 MG PO TABS: 10.0000 mg | ORAL_TABLET | Freq: Every day | ORAL | 0 refills | Status: DC

## 2023-03-16 DIAGNOSIS — H353221 Exudative age-related macular degeneration, left eye, with active choroidal neovascularization: Secondary | ICD-10-CM | POA: Diagnosis not present

## 2023-03-16 DIAGNOSIS — H353113 Nonexudative age-related macular degeneration, right eye, advanced atrophic without subfoveal involvement: Secondary | ICD-10-CM | POA: Diagnosis not present

## 2023-03-20 DIAGNOSIS — Z8546 Personal history of malignant neoplasm of prostate: Secondary | ICD-10-CM | POA: Diagnosis not present

## 2023-03-20 DIAGNOSIS — R310 Gross hematuria: Secondary | ICD-10-CM | POA: Diagnosis not present

## 2023-03-20 LAB — PSA: PSA: 0.13

## 2023-03-25 LAB — HM DIABETES EYE EXAM

## 2023-04-08 ENCOUNTER — Other Ambulatory Visit: Payer: Self-pay | Admitting: Internal Medicine

## 2023-04-08 DIAGNOSIS — K7581 Nonalcoholic steatohepatitis (NASH): Secondary | ICD-10-CM

## 2023-04-10 ENCOUNTER — Other Ambulatory Visit: Payer: Self-pay | Admitting: Internal Medicine

## 2023-04-10 DIAGNOSIS — I10 Essential (primary) hypertension: Secondary | ICD-10-CM

## 2023-04-11 ENCOUNTER — Other Ambulatory Visit: Payer: Self-pay | Admitting: Internal Medicine

## 2023-04-11 DIAGNOSIS — I10 Essential (primary) hypertension: Secondary | ICD-10-CM

## 2023-04-22 ENCOUNTER — Telehealth: Payer: Self-pay | Admitting: Internal Medicine

## 2023-04-22 NOTE — Telephone Encounter (Signed)
Contacted Corey [name] to schedule their annual wellness visit. Appointment made for 04/27/2023.  [facility] Care Guide [facility] AW[facility] TEAM Direct Dial: [phone]

## 2023-04-23 ENCOUNTER — Ambulatory Visit (INDEPENDENT_AMBULATORY_CARE_PROVIDER_SITE_OTHER): Payer: Medicare HMO | Admitting: Internal Medicine

## 2023-04-23 ENCOUNTER — Encounter: Payer: Self-pay | Admitting: Internal Medicine

## 2023-04-23 VITALS — BP 120/70 | HR 72 | Temp 98.0°F | Ht 70.0 in | Wt 211.0 lb

## 2023-04-23 DIAGNOSIS — F321 Major depressive disorder, single episode, moderate: Secondary | ICD-10-CM

## 2023-04-23 DIAGNOSIS — M545 Low back pain, unspecified: Secondary | ICD-10-CM

## 2023-04-23 DIAGNOSIS — Z0001 Encounter for general adult medical examination with abnormal findings: Secondary | ICD-10-CM

## 2023-04-23 DIAGNOSIS — R1031 Right lower quadrant pain: Secondary | ICD-10-CM | POA: Diagnosis not present

## 2023-04-23 DIAGNOSIS — E611 Iron deficiency: Secondary | ICD-10-CM | POA: Diagnosis not present

## 2023-04-23 DIAGNOSIS — R7989 Other specified abnormal findings of blood chemistry: Secondary | ICD-10-CM

## 2023-04-23 DIAGNOSIS — E785 Hyperlipidemia, unspecified: Secondary | ICD-10-CM | POA: Diagnosis not present

## 2023-04-23 DIAGNOSIS — I10 Essential (primary) hypertension: Secondary | ICD-10-CM | POA: Diagnosis not present

## 2023-04-23 DIAGNOSIS — G8929 Other chronic pain: Secondary | ICD-10-CM | POA: Insufficient documentation

## 2023-04-23 DIAGNOSIS — R1032 Left lower quadrant pain: Secondary | ICD-10-CM | POA: Diagnosis not present

## 2023-04-23 DIAGNOSIS — E1165 Type 2 diabetes mellitus with hyperglycemia: Secondary | ICD-10-CM | POA: Insufficient documentation

## 2023-04-23 DIAGNOSIS — D539 Nutritional anemia, unspecified: Secondary | ICD-10-CM | POA: Insufficient documentation

## 2023-04-23 LAB — CBC WITH DIFFERENTIAL/PLATELET
Basophils Absolute: 0 10*3/uL (ref 0.0–0.1)
Basophils Relative: 0.4 % (ref 0.0–3.0)
Eosinophils Absolute: 0.2 10*3/uL (ref 0.0–0.7)
Eosinophils Relative: 3.8 % (ref 0.0–5.0)
HCT: 43 % (ref 39.0–52.0)
Hemoglobin: 14.6 g/dL (ref 13.0–17.0)
Lymphocytes Relative: 18.9 % (ref 12.0–46.0)
Lymphs Abs: 1 10*3/uL (ref 0.7–4.0)
MCHC: 33.9 g/dL (ref 30.0–36.0)
MCV: 84 fl (ref 78.0–100.0)
Monocytes Absolute: 0.5 10*3/uL (ref 0.1–1.0)
Monocytes Relative: 9.7 % (ref 3.0–12.0)
Neutro Abs: 3.4 10*3/uL (ref 1.4–7.7)
Neutrophils Relative %: 67.2 % (ref 43.0–77.0)
Platelets: 155 10*3/uL (ref 150.0–400.0)
RBC: 5.13 Mil/uL (ref 4.22–5.81)
RDW: 14.6 % (ref 11.5–15.5)
WBC: 5.1 10*3/uL (ref 4.0–10.5)

## 2023-04-23 LAB — IBC + FERRITIN
Ferritin: 44.4 ng/mL (ref 22.0–322.0)
Iron: 34 ug/dL — ABNORMAL LOW (ref 42–165)
Saturation Ratios: 11.1 % — ABNORMAL LOW (ref 20.0–50.0)
TIBC: 305.2 ug/dL (ref 250.0–450.0)
Transferrin: 218 mg/dL (ref 212.0–360.0)

## 2023-04-23 LAB — HEMOGLOBIN A1C: Hgb A1c MFr Bld: 6 % (ref 4.6–6.5)

## 2023-04-23 LAB — LIPID PANEL
Cholesterol: 110 mg/dL (ref 0–200)
HDL: 44.4 mg/dL (ref >39.00)
LDL Cholesterol: 54 mg/dL (ref 0–99)
NonHDL: 65.44
Total CHOL/HDL Ratio: 2
Triglycerides: 58 mg/dL (ref 0.0–149.0)
VLDL: 11.6 mg/dL (ref 0.0–40.0)

## 2023-04-23 LAB — MICROALBUMIN / CREATININE URINE RATIO
Creatinine,U: 133.7 mg/dL
Microalb Creat Ratio: 42.2 mg/g — ABNORMAL HIGH (ref 0.0–30.0)
Microalb, Ur: 56.5 mg/dL — ABNORMAL HIGH (ref 0.0–1.9)

## 2023-04-23 LAB — HEPATIC FUNCTION PANEL
ALT: 27 U/L (ref 0–53)
AST: 61 U/L — ABNORMAL HIGH (ref 0–37)
Albumin: 4.3 g/dL (ref 3.5–5.2)
Alkaline Phosphatase: 78 U/L (ref 39–117)
Bilirubin, Direct: 0.1 mg/dL (ref 0.0–0.3)
Total Bilirubin: 0.4 mg/dL (ref 0.2–1.2)
Total Protein: 7.6 g/dL (ref 6.0–8.3)

## 2023-04-23 LAB — TSH: TSH: 2.85 u[IU]/mL (ref 0.35–5.50)

## 2023-04-23 LAB — BASIC METABOLIC PANEL
BUN: 16 mg/dL (ref 6–23)
CO2: 28 mEq/L (ref 19–32)
Calcium: 9.2 mg/dL (ref 8.4–10.5)
Chloride: 105 mEq/L (ref 96–112)
Creatinine, Ser: 0.96 mg/dL (ref 0.40–1.50)
GFR: 79.4 mL/min (ref >60.00)
Glucose, Bld: 92 mg/dL (ref 70–99)
Potassium: 4.1 mEq/L (ref 3.5–5.1)
Sodium: 140 mEq/L (ref 135–145)

## 2023-04-23 LAB — FOLATE: Folate: >23.5 ng/mL (ref >5.9)

## 2023-04-23 LAB — PROTIME-INR
INR: 1.1 ratio — ABNORMAL HIGH (ref 0.8–1.0)
Prothrombin Time: 11.2 s (ref 9.6–13.1)

## 2023-04-23 LAB — VITAMIN B12: Vitamin B-12: 515 pg/mL (ref 211–911)

## 2023-04-23 MED ORDER — DULOXETINE HCL 30 MG PO CPEP
30.0000 mg | ORAL_CAPSULE | Freq: Every day | ORAL | 0 refills | Status: DC
Start: 2023-04-23 — End: 2023-05-15

## 2023-04-23 MED ORDER — ATORVASTATIN CALCIUM 10 MG PO TABS: 10.0000 mg | ORAL_TABLET | Freq: Every day | ORAL | 1 refills | Status: DC

## 2023-04-23 MED ORDER — ACCRUFER 30 MG PO CAPS
1.0000 | ORAL_CAPSULE | Freq: Two times a day (BID) | ORAL | 1 refills | Status: AC
Start: 2023-04-23 — End: ?

## 2023-04-23 NOTE — Patient Instructions (Signed)
Health Maintenance, Male Adopting a healthy lifestyle and getting preventive care are important in promoting health and wellness. Ask your health care provider about: The right schedule for you to have regular tests and exams. Things you can do on your own to prevent diseases and keep yourself healthy. What should I know about diet, weight, and exercise? Eat a healthy diet  Eat a diet that includes plenty of vegetables, fruits, low-fat dairy products, and lean protein. Do not eat a lot of foods that are high in solid fats, added sugars, or sodium. Maintain a healthy weight Body mass index (BMI) is a measurement that can be used to identify possible weight problems. It estimates body fat based on height and weight. Your health care provider can help determine your BMI and help you achieve or maintain a healthy weight. Get regular exercise Get regular exercise. This is one of the most important things you can do for your health. Most adults should: Exercise for at least 150 minutes each week. The exercise should increase your heart rate and make you sweat (moderate-intensity exercise). Do strengthening exercises at least twice a week. This is in addition to the moderate-intensity exercise. Spend less time sitting. Even light physical activity can be beneficial. Watch cholesterol and blood lipids Have your blood tested for lipids and cholesterol at 72 years of age, then have this test every 5 years. You may need to have your cholesterol levels checked more often if: Your lipid or cholesterol levels are high. You are older than 72 years of age. You are at high risk for heart disease. What should I know about cancer screening? Many types of cancers can be detected early and may often be prevented. Depending on your health history and family history, you may need to have cancer screening at various ages. This may include screening for: Colorectal cancer. Prostate cancer. Skin cancer. Lung  cancer. What should I know about heart disease, diabetes, and high blood pressure? Blood pressure and heart disease High blood pressure causes heart disease and increases the risk of stroke. This is more likely to develop in people who have high blood pressure readings or are overweight. Talk with your health care provider about your target blood pressure readings. Have your blood pressure checked: Every 3-5 years if you are 18-39 years of age. Every year if you are 40 years old or older. If you are between the ages of 65 and 75 and are a current or former smoker, ask your health care provider if you should have a one-time screening for abdominal aortic aneurysm (AAA). Diabetes Have regular diabetes screenings. This checks your fasting blood sugar level. Have the screening done: Once every three years after age 45 if you are at a normal weight and have a low risk for diabetes. More often and at a younger age if you are overweight or have a high risk for diabetes. What should I know about preventing infection? Hepatitis B If you have a higher risk for hepatitis B, you should be screened for this virus. Talk with your health care provider to find out if you are at risk for hepatitis B infection. Hepatitis C Blood testing is recommended for: Everyone born from 1945 through 1965. Anyone with known risk factors for hepatitis C. Sexually transmitted infections (STIs) You should be screened each year for STIs, including gonorrhea and chlamydia, if: You are sexually active and are younger than 72 years of age. You are older than 72 years of age and your   health care provider tells you that you are at risk for this type of infection. Your sexual activity has changed since you were last screened, and you are at increased risk for chlamydia or gonorrhea. Ask your health care provider if you are at risk. Ask your health care provider about whether you are at high risk for HIV. Your health care provider  may recommend a prescription medicine to help prevent HIV infection. If you choose to take medicine to prevent HIV, you should first get tested for HIV. You should then be tested every 3 months for as long as you are taking the medicine. Follow these instructions at home: Alcohol use Do not drink alcohol if your health care provider tells you not to drink. If you drink alcohol: Limit how much you have to 0-2 drinks a day. Know how much alcohol is in your drink. In the [place] one drink equals one 12 oz bottle of beer (355 mL), one 5 oz glass of wine (148 mL), or one 1 oz glass of hard liquor (44 mL). Lifestyle Do not use any products that contain nicotine or tobacco. These products include cigarettes, chewing tobacco, and vaping devices, such as e-cigarettes. If you need help quitting, ask your health care provider. Do not use street drugs. Do not share needles. Ask your health care provider for help if you need support or information about quitting drugs. General instructions Schedule regular health, dental, and eye exams. Stay current with your vaccines. Tell your health care provider if: You often feel depressed. You have ever been abused or do not feel safe at home. Summary Adopting a healthy lifestyle and getting preventive care are important in promoting health and wellness. Follow your health care provider's instructions about healthy diet, exercising, and getting tested or screened for diseases. Follow your health care provider's instructions on monitoring your cholesterol and blood pressure. This information is not intended to replace advice given to you by your health care provider. Make sure you discuss any questions you have with your health care provider. Document Revised: 05/06/2021 Document Reviewed: 05/06/2021 Elsevier Patient Education  2023 [employer]  

## 2023-04-23 NOTE — Progress Notes (Signed)
Subjective:  Patient ID: Corey [name], male    DOB: Jun 09, 1951  Age: 72 y.o. MRN: [id number]  CC: Annual Exam, Hypertension, Hyperlipidemia, Diabetes, Back Pain, and Anemia   HPI Corey [name] presents for a CPX and f/up ----  I reviewed labs that he had done at [facility] about 4 months ago.  He had a mildly elevated AST, hyperglycemia, and anemia.  His PSA was normal.  He had hematuria but the culture was negative.  He continues to complain of low back pain without radiation or paresthesias and chronic bilateral lower quadrant abdominal pain.  He tells me he has had 2 CT scans done in the last year regarding the abdominal pain without any abnormal findings.  He is active and denies chest pain, shortness of breath, diaphoresis, or edema.   Outpatient Medications Prior to Visit  Medication Sig Dispense Refill   benazepril (LOTENSIN) 20 MG tablet TAKE 1 TABLET BY MOUTH EVERY DAY 90 tablet 0   Multiple Vitamin (MULTIVITAMIN WITH MINERALS) TABS tablet Take 1 tablet by mouth daily.     amLODipine (NORVASC) 10 MG tablet TAKE 1 TABLET BY MOUTH EVERY DAY 90 tablet 0   atorvastatin (LIPITOR) 10 MG tablet Take 1 tablet (10 mg total) by mouth daily. 90 tablet 0   No facility-administered medications prior to visit.    ROS Review of Systems  Constitutional:  Positive for unexpected weight change (wt gain). Negative for appetite change, chills, diaphoresis and fatigue.  HENT: Negative.    Eyes: Negative.   Respiratory:  Negative for cough, chest tightness, shortness of breath and wheezing.   Cardiovascular:  Negative for chest pain, palpitations and leg swelling.  Gastrointestinal:  Positive for abdominal pain. Negative for blood in stool, constipation, diarrhea, nausea and vomiting.  Endocrine: Negative.   Genitourinary: Negative.  Negative for difficulty urinating, dysuria and hematuria.  Musculoskeletal:  Positive for back pain. Negative for arthralgias and myalgias.  Skin: Negative.    Neurological: Negative.  Negative for dizziness and weakness.  Hematological:  Negative for adenopathy. Does not bruise/bleed easily.  Psychiatric/Behavioral:  Positive for dysphoric mood. Negative for decreased concentration, self-injury, sleep disturbance and suicidal ideas. The patient is not nervous/anxious.     Objective:  BP 120/70 (BP Location: Right Arm, Patient Position: Sitting, Cuff Size: Large)   Pulse 72   Temp 98 F (36.7 C) (Oral)   Ht 5\' 10"  (1.778 m)   Wt 211 lb (95.7 kg)   SpO2 95%   BMI 30.28 kg/m   BP Readings from Last 3 Encounters:  04/23/23 120/70  07/15/22 132/80  05/07/22 118/76    Wt Readings from Last 3 Encounters:  04/23/23 211 lb (95.7 kg)  07/15/22 194 lb 8 oz (88.2 kg)  05/07/22 205 lb (93 kg)    Physical Exam Vitals reviewed.  Constitutional:      Appearance: Normal appearance.  HENT:     Nose: Nose normal.     Mouth/Throat:     Mouth: Mucous membranes are moist.  Eyes:     General: No scleral icterus.    Conjunctiva/sclera: Conjunctivae normal.  Cardiovascular:     Rate and Rhythm: Normal rate and regular rhythm.     Heart sounds: Normal heart sounds, S1 normal and S2 normal. No murmur heard.    No friction rub. No gallop.     Comments: EKG- NSR, 64 bpm No LVH or Q waves Normal EKG Pulmonary:     Effort: Pulmonary effort is normal.  Breath sounds: No stridor. No wheezing, rhonchi or rales.  Abdominal:     General: Abdomen is flat.     Palpations: There is no mass.     Tenderness: There is no abdominal tenderness. There is no guarding or rebound.     Hernia: No hernia is present. There is no hernia in the umbilical area, ventral area, left inguinal area, right femoral area, left femoral area or right inguinal area.  Genitourinary:    Pubic Area: No rash.      Penis: Normal and circumcised.      Testes: Normal.     Epididymis:     Right: Normal.     Left: Normal.     Prostate: Normal. Not enlarged, not tender and no  nodules present.     Rectum: Normal. Guaiac result negative. No mass, tenderness, anal fissure, external hemorrhoid or internal hemorrhoid. Normal anal tone.  Musculoskeletal:     Cervical back: Neck supple.     Right lower leg: No edema.     Left lower leg: No edema.  Lymphadenopathy:     Cervical: No cervical adenopathy.     Lower Body: No right inguinal adenopathy. No left inguinal adenopathy.  Neurological:     General: No focal deficit present.     Mental Status: He is alert. Mental status is at baseline.  Psychiatric:        Attention and Perception: Attention and perception normal.        Mood and Affect: Mood is depressed. Mood is not anxious. Affect is flat.        Speech: Speech normal. He is communicative. Speech is not rapid and pressured, delayed, slurred or tangential.        Behavior: Behavior normal. Behavior is cooperative.        Thought Content: Thought content normal.        Cognition and Memory: Cognition normal.        Judgment: Judgment normal.     Lab Results  Component Value Date   WBC 5.1 04/23/2023   HGB 14.6 04/23/2023   HCT 43.0 04/23/2023   PLT 155.0 04/23/2023   GLUCOSE 92 04/23/2023   CHOL 110 04/23/2023   TRIG 58.0 04/23/2023   HDL 44.40 04/23/2023   LDLCALC 54 04/23/2023   ALT 27 04/23/2023   AST 61 (H) 04/23/2023   NA 140 04/23/2023   K 4.1 04/23/2023   CL 105 04/23/2023   CREATININE 0.96 04/23/2023   BUN 16 04/23/2023   CO2 28 04/23/2023   TSH 2.85 04/23/2023   PSA 0.13 03/20/2023   INR 1.1 (H) 04/23/2023   HGBA1C 6.0 04/23/2023   MICROALBUR 56.5 (H) 04/23/2023    CT Abdomen Pelvis W Contrast  Result Date: 05/08/2022 CLINICAL DATA:  Lower abdominal pain x 10 days, intermittent hematuria EXAM: CT ABDOMEN AND PELVIS WITH CONTRAST TECHNIQUE: Multidetector CT imaging of the abdomen and pelvis was performed using the standard protocol following bolus administration of intravenous contrast. RADIATION DOSE REDUCTION: This exam was  performed according to the departmental dose-optimization program which includes automated exposure control, adjustment of the mA and/or kV according to patient size and/or use of iterative reconstruction technique. CONTRAST:  OMNIPAQUE IOHEXOL 300 MG/ML  SOLN COMPARISON:  Radiograph of abdomen done on 05/07/2022 FINDINGS: Lower chest: There is no focal pulmonary consolidation in the lower lung fields. There is no pleural effusion. In image 6 of series 3, there is 4 mm faint nodule in the lingula. Hepatobiliary: No  focal abnormality is seen. There is no dilation of bile ducts. Gallbladder is unremarkable. Pancreas: No focal abnormality is seen Spleen: Spleen measures 14.3 cm in maximum diameter. Adrenals/Urinary Tract: Adrenals are unremarkable. There is no hydronephrosis. There is 1.5 cm low-density structure in the anterior midportion of left kidney. Density measurements are higher than usual for simple cyst. There are no renal or ureteral stones. There is diffuse wall thickening in the urinary bladder. There are small pockets of air in the anterior aspect of the urinary bladder. Stomach/Bowel: Small hiatal hernia is seen. Small bowel loops are not dilated. Appendix is not dilated. There is no significant wall thickening in colon. Diverticulosis is seen in colon without signs of focal acute diverticulitis. Vascular/Lymphatic: Unremarkable. Reproductive: Prostate is not seen. There are coarse calcifications adjacent to the prostatic urethra. Prominence of prostatic urethra suggests possible previous transurethral resection of prostate. There are fluid-filled structures immediately posterior to the rectus muscles in the suprapubic region possibly devices used for management of erectile dysfunction. Other: There is no ascites or pneumoperitoneum. Small umbilical hernia containing fat is seen. Small bilateral inguinal hernias containing fat are noted. Musculoskeletal: Degenerative changes are noted with disc  space narrowing and encroachment of neural foramina at L5-S1 level. There is minimal anterolisthesis at L4-L5 level along with disc space narrowing. IMPRESSION: There is no evidence of intestinal obstruction or pneumoperitoneum. There is no hydronephrosis. Appendix is not dilated. There is diffuse wall thickening in the urinary bladder. This finding suggests possible chronic cystitis. There are small pockets of air in the anterior aspect of the urinary bladder. This may be due to cystitis or suggest recent catheterization. Enlarged spleen. Diverticulosis of colon without signs of focal diverticulitis. Other findings as described in the body of the report. Electronically Signed   By: Ernie Avena M.D.   On: 05/08/2022 11:55    Assessment & Plan:   Essential hypertension, benign- His blood pressure is overcontrolled.  Will discontinue amlodipine and continue the ACEI. -     Basic metabolic panel; Future -     CBC with Differential/Platelet; Future -     TSH; Future -     EKG 12-Lead  Hyperlipidemia with target LDL less than 130 - LDL goal achieved. Doing well on the statin  -     Lipid panel; Future -     TSH; Future -     Atorvastatin Calcium; Take 1 tablet (10 mg total) by mouth daily.  Dispense: 90 tablet; Refill: 1  Encounter for general adult medical examination with abnormal findings- Exam completed, labs reviewed, vaccines reviewed and updated, cancer screenings are up-to-date, patient education was given.  Deficiency anemia- His H&H are normal but his iron level is low. -     IBC + Ferritin; Future -     Vitamin B12; Future -     CBC with Differential/Platelet; Future -     Reticulocytes; Future -     Vitamin B1; Future -     Zinc; Future -     Folate; Future  LFTs abnormal- This is consistent with NASH. -     Hepatic function panel; Future -     Protime-INR; Future  Type 2 diabetes mellitus with hyperglycemia, without long-term current use of insulin (HCC)- His blood  sugar is well-controlled. -     Microalbumin / creatinine urine ratio; Future -     Basic metabolic panel; Future -     Hemoglobin A1c; Future -     HM Diabetes  Foot Exam  Chronic bilateral low back pain without sciatica -     DULoxetine HCl; Take 1 capsule (30 mg total) by mouth daily.  Dispense: 30 capsule; Refill: 0  Chronic bilateral lower abdominal pain -     DULoxetine HCl; Take 1 capsule (30 mg total) by mouth daily.  Dispense: 30 capsule; Refill: 0  Current moderate episode of major depressive disorder without prior episode (HCC) -     DULoxetine HCl; Take 1 capsule (30 mg total) by mouth daily.  Dispense: 30 capsule; Refill: 0  Iron deficiency -     ACCRUFeR; Take 1 capsule (30 mg total) by mouth in the morning and at bedtime.  Dispense: 90 capsule; Refill: 1     Follow-up: Return in about 6 months (around 10/23/2023).  Sanda Linger, MD

## 2023-04-27 ENCOUNTER — Ambulatory Visit (INDEPENDENT_AMBULATORY_CARE_PROVIDER_SITE_OTHER): Payer: Medicare HMO

## 2023-04-27 VITALS — Ht 70.0 in | Wt 211.0 lb

## 2023-04-27 DIAGNOSIS — Z0000 Encounter for general adult medical examination without abnormal findings: Secondary | ICD-10-CM

## 2023-04-27 NOTE — Patient Instructions (Signed)
Corey [name] , Thank you for taking time to come for your Medicare Wellness Visit. I appreciate your ongoing commitment to your health goals. Please review the following plan we discussed and let me know if I can assist you in the future.   These are the goals we discussed:  Goals      Manage My Medicine     Timeframe:  Long-Range Goal Priority:  Medium Start Date:   05/27/2022                          Expected End Date:   05/28/2023                    Follow Up Date 10/2022   - call for medicine refill 2 or 3 days before it runs out - call if I am sick and can't take my medicine - keep a list of all the medicines I take; vitamins and herbals too - learn to read medicine labels    Why is this important?   These steps will help you keep on track with your medicines.   Notes:      My goal for 2024 is to get back to physical exercise and eliminating this back pain.        This is a list of the screening recommended for you and due dates:  Health Maintenance  Topic Date Due   COVID-19 Vaccine (5 - 2023-24 season) 08/29/2022   Flu Shot  07/30/2023   DTaP/Tdap/Td vaccine (2 - Td or Tdap) 08/06/2023   Hemoglobin A1C  10/23/2023   Eye exam for diabetics  03/24/2024   Yearly kidney function blood test for diabetes  04/22/2024   Yearly kidney health urinalysis for diabetes  04/22/2024   Complete foot exam   04/22/2024   Medicare Annual Wellness Visit  04/26/2024   Colon Cancer Screening  03/11/2026   Pneumonia Vaccine  Completed   Hepatitis C Screening: USPSTF Recommendation to screen - Ages 79-79 yo.  Completed   Zoster (Shingles) Vaccine  Completed   HPV Vaccine  Aged Out    Advanced directives: No  Conditions/risks identified: Yes  Next appointment: Follow up in one year for your annual wellness visit.   Preventive Care 55 Years and Older, Male  Preventive care refers to lifestyle choices and visits with your health care provider that can promote health and wellness. What  does preventive care include? A yearly physical exam. This is also called an annual well check. Dental exams once or twice a year. Routine eye exams. Ask your health care provider how often you should have your eyes checked. Personal lifestyle choices, including: Daily care of your teeth and gums. Regular physical activity. Eating a healthy diet. Avoiding tobacco and drug use. Limiting alcohol use. Practicing safe sex. Taking low doses of aspirin every day. Taking vitamin and mineral supplements as recommended by your health care provider. What happens during an annual well check? The services and screenings done by your health care provider during your annual well check will depend on your age, overall health, lifestyle risk factors, and family history of disease. Counseling  Your health care provider may ask you questions about your: Alcohol use. Tobacco use. Drug use. Emotional well-being. Home and relationship well-being. Sexual activity. Eating habits. History of falls. Memory and ability to understand (cognition). Work and work Astronomer. Screening  You may have the following tests or measurements: Height, weight, and BMI. Blood  pressure. Lipid and cholesterol levels. These may be checked every 5 years, or more frequently if you are over 42 years old. Skin check. Lung cancer screening. You may have this screening every year starting at age 72 if you have a 30-pack-year history of smoking and currently smoke or have quit within the past 15 years. Fecal occult blood test (FOBT) of the stool. You may have this test every year starting at age 74. Flexible sigmoidoscopy or colonoscopy. You may have a sigmoidoscopy every 5 years or a colonoscopy every 10 years starting at age 61. Prostate cancer screening. Recommendations will vary depending on your family history and other risks. Hepatitis C blood test. Hepatitis B blood test. Sexually transmitted disease (STD)  testing. Diabetes screening. This is done by checking your blood sugar (glucose) after you have not eaten for a while (fasting). You may have this done every 1-3 years. Abdominal aortic aneurysm (AAA) screening. You may need this if you are a current or former smoker. Osteoporosis. You may be screened starting at age 31 if you are at high risk. Talk with your health care provider about your test results, treatment options, and if necessary, the need for more tests. Vaccines  Your health care provider may recommend certain vaccines, such as: Influenza vaccine. This is recommended every year. Tetanus, diphtheria, and acellular pertussis (Tdap, Td) vaccine. You may need a Td booster every 10 years. Zoster vaccine. You may need this after age 71. Pneumococcal 13-valent conjugate (PCV13) vaccine. One dose is recommended after age 63. Pneumococcal polysaccharide (PPSV23) vaccine. One dose is recommended after age 2. Talk to your health care provider about which screenings and vaccines you need and how often you need them. This information is not intended to replace advice given to you by your health care provider. Make sure you discuss any questions you have with your health care provider. Document Released: 01/11/2016 Document Revised: 09/03/2016 Document Reviewed: 10/16/2015 Elsevier Interactive Patient Education  2017 [employer].  Fall Prevention in the Home Falls can cause injuries. They can happen to people of all ages. There are many things you can do to make your home safe and to help prevent falls. What can I do on the outside of my home? Regularly fix the edges of walkways and driveways and fix any cracks. Remove anything that might make you trip as you walk through a door, such as a raised step or threshold. Trim any bushes or trees on the path to your home. Use bright outdoor lighting. Clear any walking paths of anything that might make someone trip, such as rocks or  tools. Regularly check to see if handrails are loose or broken. Make sure that both sides of any steps have handrails. Any raised decks and porches should have guardrails on the edges. Have any leaves, snow, or ice cleared regularly. Use sand or salt on walking paths during winter. Clean up any spills in your garage right away. This includes oil or grease spills. What can I do in the bathroom? Use night lights. Install grab bars by the toilet and in the tub and shower. Do not use towel bars as grab bars. Use non-skid mats or decals in the tub or shower. If you need to sit down in the shower, use a plastic, non-slip stool. Keep the floor dry. Clean up any water that spills on the floor as soon as it happens. Remove soap buildup in the tub or shower regularly. Attach bath mats securely with double-sided non-slip rug  tape. Do not have throw rugs and other things on the floor that can make you trip. What can I do in the bedroom? Use night lights. Make sure that you have a light by your bed that is easy to reach. Do not use any sheets or blankets that are too big for your bed. They should not hang down onto the floor. Have a firm chair that has side arms. You can use this for support while you get dressed. Do not have throw rugs and other things on the floor that can make you trip. What can I do in the kitchen? Clean up any spills right away. Avoid walking on wet floors. Keep items that you use a lot in easy-to-reach places. If you need to reach something above you, use a strong step stool that has a grab bar. Keep electrical cords out of the way. Do not use floor polish or wax that makes floors slippery. If you must use wax, use non-skid floor wax. Do not have throw rugs and other things on the floor that can make you trip. What can I do with my stairs? Do not leave any items on the stairs. Make sure that there are handrails on both sides of the stairs and use them. Fix handrails that are  broken or loose. Make sure that handrails are as long as the stairways. Check any carpeting to make sure that it is firmly attached to the stairs. Fix any carpet that is loose or worn. Avoid having throw rugs at the top or bottom of the stairs. If you do have throw rugs, attach them to the floor with carpet tape. Make sure that you have a light switch at the top of the stairs and the bottom of the stairs. If you do not have them, ask someone to add them for you. What else can I do to help prevent falls? Wear shoes that: Do not have high heels. Have rubber bottoms. Are comfortable and fit you well. Are closed at the toe. Do not wear sandals. If you use a stepladder: Make sure that it is fully opened. Do not climb a closed stepladder. Make sure that both sides of the stepladder are locked into place. Ask someone to hold it for you, if possible. Clearly mark and make sure that you can see: Any grab bars or handrails. First and last steps. Where the edge of each step is. Use tools that help you move around (mobility aids) if they are needed. These include: Canes. Walkers. Scooters. Crutches. Turn on the lights when you go into a dark area. Replace any light bulbs as soon as they burn out. Set up your furniture so you have a clear path. Avoid moving your furniture around. If any of your floors are uneven, fix them. If there are any pets around you, be aware of where they are. Review your medicines with your doctor. Some medicines can make you feel dizzy. This can increase your chance of falling. Ask your doctor what other things that you can do to help prevent falls. This information is not intended to replace advice given to you by your health care provider. Make sure you discuss any questions you have with your health care provider. Document Released: 10/11/2009 Document Revised: 05/22/2016 Document Reviewed: 01/19/2015 Elsevier Interactive Patient Education  2017 [employer].

## 2023-04-27 NOTE — Progress Notes (Signed)
I connected with  Corey [name] on 04/27/23 by a audio enabled telemedicine application and verified that I am speaking with the correct person using two identifiers.  Patient Location: Home  Provider Location: Office/Clinic  I discussed the limitations of evaluation and management by telemedicine. The patient expressed understanding and agreed to proceed.  Patient Medicare AWV questionnaire was completed by the patient on 04/22/2023; I have confirmed that all information answered by patient is correct and no changes since this date.     Subjective:   Corey [name] is a 72 y.o. male who presents for Medicare Annual/Subsequent preventive examination.  Review of Systems     Cardiac Risk Factors include: advanced age (>94men, >60 women);dyslipidemia;hypertension;male gender;obesity (BMI >30kg/m2);sedentary lifestyle     Objective:    Today's Vitals   04/27/23 1436 04/27/23 1437  Weight: 211 lb (95.7 kg)   Height: 5\' 10"  (1.778 m)   PainSc: 8  8   PainLoc: Back    Body mass index is 30.28 kg/m.     04/27/2023    2:44 PM 06/03/2021    3:12 PM 04/09/2020    3:45 PM 07/19/2018    9:34 AM 07/02/2017   11:33 AM 03/12/2017    6:30 PM 03/10/2017   11:07 AM  Advanced Directives  Does Patient Have a Medical Advance Directive? No No No No No No No  Would patient like information on creating a medical advance directive? No - Patient declined No - Patient declined Yes (ED - Information included in AVS) Yes (ED - Information included in AVS) Yes (Inpatient - patient defers creating a medical advance directive at this time) No - Patient declined Yes (MAU/Ambulatory/Procedural Areas - Information given)    Current Medications (verified) Outpatient Encounter Medications as of 04/27/2023  Medication Sig   atorvastatin (LIPITOR) 10 MG tablet Take 1 tablet (10 mg total) by mouth daily.   benazepril (LOTENSIN) 20 MG tablet TAKE 1 TABLET BY MOUTH EVERY DAY   DULoxetine (CYMBALTA) 30 MG capsule Take 1  capsule (30 mg total) by mouth daily.   Ferric Maltol (ACCRUFER) 30 MG CAPS Take 1 capsule (30 mg total) by mouth in the morning and at bedtime.   Multiple Vitamin (MULTIVITAMIN WITH MINERALS) TABS tablet Take 1 tablet by mouth daily.   No facility-administered encounter medications on file as of 04/27/2023.    Allergies (verified) Patient has no known allergies.   History: Past Medical History:  Diagnosis Date   Arthritis    "minor; elbows, wrists" (03/12/2017)   BPH (benign prostatic hyperplasia) 2011   Hypertension    Incisional hernia    Prostate cancer (HCC) 2016   S/P prostatectomy, TURP, radiation in 2017   Past Surgical History:  Procedure Laterality Date   HERNIA REPAIR     INCISIONAL HERNIA REPAIR N/A 03/12/2017   Procedure: LAPAROSCOPIC INCISIONAL HERNIA REPAIR;  Surgeon: Harriette Bouillon, MD;  Location: [facility] OR;  Service: General;  Laterality: N/A;   INGUINAL HERNIA REPAIR Right ~ 2007   INSERTION OF MESH N/A 03/12/2017   Procedure: INSERTION OF MESH;  Surgeon: Harriette Bouillon, MD;  Location: [facility] OR;  Service: General;  Laterality: N/A;   LAPAROSCOPIC INCISIONAL / UMBILICAL / VENTRAL HERNIA REPAIR  03/12/2017   IHR w/mesh   PROSTATECTOMY  2017   TRANSURETHRAL RESECTION OF PROSTATE  2017   "after they removed my prostate"   UMBILICAL HERNIA REPAIR  ~ 2012   Family History  Problem Relation Age of Onset   Cancer Mother  Alcohol abuse Sister    Early death Sister    Stroke Neg Hx    Kidney disease Neg Hx    Hypertension Neg Hx    Heart disease Neg Hx    Hyperlipidemia Neg Hx    Diabetes Neg Hx    Depression Neg Hx    COPD Neg Hx    Asthma Neg Hx    Social History   Socioeconomic History   Marital status: Married    Spouse name: Not on file   Number of children: 3   Years of education: Not on file   Highest education level: 12th grade  Occupational History   Not on file  Tobacco Use   Smoking status: Never    Passive exposure: Never   Smokeless tobacco:  Never  Vaping Use   Vaping Use: Never used  Substance and Sexual Activity   Alcohol use: Yes    Alcohol/week: 5.0 standard drinks of alcohol    Types: 5 Glasses of wine per week   Drug use: No   Sexual activity: Yes    Partners: Male    Birth control/protection: None  Other Topics Concern   Not on file  Social History Narrative   Not on file   Social Determinants of Health   Financial Resource Strain: Low Risk  (04/27/2023)   Overall Financial Resource Strain (CARDIA)    Difficulty of Paying Living Expenses: Not hard at all  Food Insecurity: No Food Insecurity (04/27/2023)   Hunger Vital Sign    Worried About Running Out of Food in the Last Year: Never true    Ran Out of Food in the Last Year: Never true  Transportation Needs: No Transportation Needs (04/27/2023)   PRAPARE - Administrator, Civil Service (Medical): No    Lack of Transportation (Non-Medical): No  Physical Activity: Inactive (04/27/2023)   Exercise Vital Sign    Days of Exercise per Week: 0 days    Minutes of Exercise per Session: 0 min  Stress: No Stress Concern Present (04/27/2023)   [employer] of Occupational Health - Occupational Stress Questionnaire    Feeling of Stress : Only a little  Social Connections: Socially Integrated (04/27/2023)   Social Connection and Isolation Panel [NHANES]    Frequency of Communication with Friends and Family: More than three times a week    Frequency of Social Gatherings with Friends and Family: Once a week    Attends Religious Services: More than 4 times per year    Active Member of [employer] or Organizations: Yes    Attends Engineer, structural: More than 4 times per year    Marital Status: Married    Tobacco Counseling Counseling given: Not Answered   Clinical Intake:  Pre-visit preparation completed: Yes  Pain : 0-10 Pain Score: 8  Pain Type: Acute pain Pain Location: Back     BMI - recorded: 30.28 Nutritional Status: BMI > 30   Obese Nutritional Risks: None Diabetes: No  How often do you need to have someone help you when you read instructions, pamphlets, or other written materials from your doctor or pharmacy?: 2 - Rarely What is the last grade level you completed in school?: HSG  Diabetic? No  Interpreter Needed?: No  Information entered by :: Susie Cassette, LPN.   Activities of Daily Living    04/27/2023    2:45 PM 04/23/2023   10:44 AM  In your present state of health, do you have any difficulty performing  the following activities:  Hearing? 0 0  Vision? 0 0  Difficulty concentrating or making decisions? 0 0  Walking or climbing stairs? 0 0  Dressing or bathing? 0 0  Doing errands, shopping? 0 0  Preparing Food and eating ? N N  Using the Toilet? N N  In the past six months, have you accidently leaked urine? N N  Do you have problems with loss of bowel control? N N  Managing your Medications? N N  Managing your Finances? N N  Housekeeping or managing your Housekeeping? N N    Patient Care Team: Etta Grandchild, MD as PCP - General (Internal Medicine) Adelene Idler, MD as Attending Physician (Ophthalmology) Szabat, Vinnie Level, [facility] (Inactive) as Pharmacist (Pharmacist)  Indicate any recent Medical Services you may have received from other than [facility] providers in the past year (date may be approximate).     Assessment:   This is a routine wellness examination for Arben.  Hearing/Vision screen Hearing Screening - Comments:: Denies hearing difficulties   Vision Screening - Comments:: Wears rx glasses - up to date with routine eye exams with Reginal Lutes, MD.   Dietary issues and exercise activities discussed: Current Exercise Habits: The patient does not participate in regular exercise at present, Exercise limited by: orthopedic condition(s);Other - see comments (low back pain)   Goals Addressed             This Visit's Progress    My goal for 2024 is to get back to  physical exercise and eliminating this back pain.        Depression Screen    04/27/2023    2:40 PM 07/15/2022    9:49 AM 06/11/2022    3:51 PM 04/09/2022   11:06 AM 06/03/2021    3:11 PM 04/09/2020    3:45 PM 07/19/2018    9:34 AM  PHQ 2/9 Scores  PHQ - 2 Score 0 0 0 0 0 0 0  PHQ- 9 Score 0      0    Fall Risk    04/27/2023    2:45 PM 04/23/2023   10:44 AM 07/15/2022    9:49 AM 06/11/2022    3:53 PM 04/09/2022   11:06 AM  Fall Risk   Falls in the past year? 0 0 0 0 0  Number falls in past yr: 0   0 0  Injury with Fall? 0   0 0  Risk for fall due to : No Fall Risks   No Fall Risks No Fall Risks  Follow up Falls prevention discussed   Falls evaluation completed Falls evaluation completed    FALL RISK PREVENTION PERTAINING TO THE HOME:  Any stairs in or around the home? No  If so, are there any without handrails? No  Home free of loose throw rugs in walkways, pet beds, electrical cords, etc? Yes  Adequate lighting in your home to reduce risk of falls? Yes   ASSISTIVE DEVICES UTILIZED TO PREVENT FALLS:  Life alert? No  Use of a cane, walker or w/c? No  Grab bars in the bathroom? No  Shower chair or bench in shower? No  Elevated toilet seat or a handicapped toilet? No   TIMED UP AND GO:  Was the test performed? No . Telephonic Visit  Cognitive Function:        04/27/2023    2:46 PM 04/09/2020    3:47 PM  6CIT Screen  What Year? 0 points 0 points  What month? 0 points 0 points  What time? 0 points 0 points  Count back from 20 0 points 0 points  Months in reverse 0 points 0 points  Repeat phrase 0 points 0 points  Total Score 0 points 0 points    Immunizations Immunization History  Administered Date(s) Administered   Fluad Quad(high Dose 65+) 10/22/2020   Influenza, High Dose Seasonal PF 02/10/2018   Influenza-Unspecified 09/27/2019, 10/15/2021   PFIZER(Purple Top)SARS-COV-2 Vaccination 03/15/2020, 04/10/2020, 10/04/2020, 04/04/2021   Pneumococcal Conjugate-13  06/29/2017   Pneumococcal Polysaccharide-23 08/05/2013, 05/10/2019   Tdap 08/05/2013   Zoster Recombinat (Shingrix) 10/28/2020, 10/29/2020, 06/20/2021    TDAP status: Up to date  Flu Vaccine status: Due, Education has been provided regarding the importance of this vaccine. Advised may receive this vaccine at local pharmacy or Health Dept. Aware to provide a copy of the vaccination record if obtained from local pharmacy or Health Dept. Verbalized acceptance and understanding.  Pneumococcal vaccine status: Up to date  Covid-19 vaccine status: Completed vaccines  Qualifies for Shingles Vaccine? Yes   Zostavax completed No   Shingrix Completed?: Yes  Screening Tests Health Maintenance  Topic Date Due   COVID-19 Vaccine (5 - 2023-24 season) 08/29/2022   INFLUENZA VACCINE  07/30/2023   DTaP/Tdap/Td (2 - Td or Tdap) 08/06/2023   HEMOGLOBIN A1C  10/23/2023   OPHTHALMOLOGY EXAM  03/24/2024   Diabetic kidney evaluation - eGFR measurement  04/22/2024   Diabetic kidney evaluation - Urine ACR  04/22/2024   FOOT EXAM  04/22/2024   Medicare Annual Wellness (AWV)  04/26/2024   COLONOSCOPY (Pts 45-44yrs Insurance coverage will need to be confirmed)  03/11/2026   Pneumonia Vaccine 24+ Years old  Completed   Hepatitis C Screening  Completed   Zoster Vaccines- Shingrix  Completed   HPV VACCINES  Aged Out    Health Maintenance  Health Maintenance Due  Topic Date Due   COVID-19 Vaccine (5 - 2023-24 season) 08/29/2022    Colorectal cancer screening: Type of screening: Colonoscopy. Completed 03/11/2016. Repeat every 10 years  Lung Cancer Screening: (Low Dose CT Chest recommended if Age 80-80 years, 30 pack-year currently smoking OR have quit w/in 15years.) does not qualify.   Lung Cancer Screening Referral: no  Additional Screening:  Hepatitis C Screening: does qualify; Completed 08/05/2013  Vision Screening: Recommended annual ophthalmology exams for early detection of glaucoma and other  disorders of the eye. Is the patient up to date with their annual eye exam?  Yes  Who is the provider or what is the name of the office in which the patient attends annual eye exams? Reginal Lutes, MD. If pt is not established with a provider, would they like to be referred to a provider to establish care? No .   Dental Screening: Recommended annual dental exams for proper oral hygiene  Community Resource Referral / Chronic Care Management: CRR required this visit?  No   CCM required this visit?  No      Plan:     I have personally reviewed and noted the following in the patient's chart:   Medical and social history Use of alcohol, tobacco or illicit drugs  Current medications and supplements including opioid prescriptions. Patient is not currently taking opioid prescriptions. Functional ability and status Nutritional status Physical activity Advanced directives List of other physicians Hospitalizations, surgeries, and ER visits in previous 12 months Vitals Screenings to include cognitive, depression, and falls Referrals and appointments  In addition, I have reviewed and discussed with  patient certain preventive protocols, quality metrics, and best practice recommendations. A written personalized care plan for preventive services as well as general preventive health recommendations were provided to patient.     Mickeal Needy, LPN   [id number]/61/0960   Nurse Notes:  Normal cognitive status assessed by direct observation via phone conversation by this Nurse Health Advisor. No abnormalities found.

## 2023-04-29 ENCOUNTER — Other Ambulatory Visit: Payer: Self-pay | Admitting: Internal Medicine

## 2023-04-29 DIAGNOSIS — E60 Dietary zinc deficiency: Secondary | ICD-10-CM | POA: Insufficient documentation

## 2023-04-29 LAB — RETICULOCYTES
ABS Retic: 67990 cells/uL (ref 25000–90000)
Retic Ct Pct: 1.3 %

## 2023-04-29 LAB — VITAMIN B1: Vitamin B1 (Thiamine): 8 nmol/L (ref 8–30)

## 2023-04-29 LAB — ZINC: Zinc: 35 ug/dL — ABNORMAL LOW (ref 60–130)

## 2023-04-29 MED ORDER — ZINC GLUCONATE 50 MG PO TABS
50.0000 mg | ORAL_TABLET | Freq: Every day | ORAL | 1 refills | Status: AC
Start: 2023-04-29 — End: ?

## 2023-05-15 ENCOUNTER — Other Ambulatory Visit: Payer: Self-pay | Admitting: Internal Medicine

## 2023-05-15 DIAGNOSIS — M5416 Radiculopathy, lumbar region: Secondary | ICD-10-CM

## 2023-05-15 DIAGNOSIS — F321 Major depressive disorder, single episode, moderate: Secondary | ICD-10-CM

## 2023-05-15 DIAGNOSIS — G8929 Other chronic pain: Secondary | ICD-10-CM

## 2023-05-15 MED ORDER — DULOXETINE HCL 60 MG PO CPEP
60.0000 mg | ORAL_CAPSULE | Freq: Every day | ORAL | 0 refills | Status: DC
Start: 2023-05-15 — End: 2023-08-18

## 2023-05-20 DIAGNOSIS — H43812 Vitreous degeneration, left eye: Secondary | ICD-10-CM | POA: Diagnosis not present

## 2023-05-20 DIAGNOSIS — H353113 Nonexudative age-related macular degeneration, right eye, advanced atrophic without subfoveal involvement: Secondary | ICD-10-CM | POA: Diagnosis not present

## 2023-05-20 DIAGNOSIS — H35033 Hypertensive retinopathy, bilateral: Secondary | ICD-10-CM | POA: Diagnosis not present

## 2023-05-20 DIAGNOSIS — H43821 Vitreomacular adhesion, right eye: Secondary | ICD-10-CM | POA: Diagnosis not present

## 2023-05-20 DIAGNOSIS — H353221 Exudative age-related macular degeneration, left eye, with active choroidal neovascularization: Secondary | ICD-10-CM | POA: Diagnosis not present

## 2023-06-06 ENCOUNTER — Other Ambulatory Visit: Payer: Self-pay | Admitting: Internal Medicine

## 2023-06-06 DIAGNOSIS — K7581 Nonalcoholic steatohepatitis (NASH): Secondary | ICD-10-CM

## 2023-06-22 DIAGNOSIS — H2511 Age-related nuclear cataract, right eye: Secondary | ICD-10-CM | POA: Diagnosis not present

## 2023-06-22 DIAGNOSIS — H26492 Other secondary cataract, left eye: Secondary | ICD-10-CM | POA: Diagnosis not present

## 2023-06-30 DIAGNOSIS — I10 Essential (primary) hypertension: Secondary | ICD-10-CM | POA: Diagnosis not present

## 2023-06-30 DIAGNOSIS — E1136 Type 2 diabetes mellitus with diabetic cataract: Secondary | ICD-10-CM | POA: Diagnosis not present

## 2023-06-30 DIAGNOSIS — E785 Hyperlipidemia, unspecified: Secondary | ICD-10-CM | POA: Diagnosis not present

## 2023-06-30 DIAGNOSIS — H26492 Other secondary cataract, left eye: Secondary | ICD-10-CM | POA: Diagnosis not present

## 2023-06-30 DIAGNOSIS — H2511 Age-related nuclear cataract, right eye: Secondary | ICD-10-CM | POA: Diagnosis not present

## 2023-07-04 ENCOUNTER — Other Ambulatory Visit: Payer: Self-pay | Admitting: Internal Medicine

## 2023-07-04 DIAGNOSIS — I10 Essential (primary) hypertension: Secondary | ICD-10-CM

## 2023-07-06 ENCOUNTER — Other Ambulatory Visit: Payer: Self-pay | Admitting: Internal Medicine

## 2023-07-06 DIAGNOSIS — I10 Essential (primary) hypertension: Secondary | ICD-10-CM

## 2023-07-10 DIAGNOSIS — H26492 Other secondary cataract, left eye: Secondary | ICD-10-CM | POA: Diagnosis not present

## 2023-07-17 ENCOUNTER — Telehealth: Payer: Self-pay | Admitting: *Deleted

## 2023-07-17 NOTE — Telephone Encounter (Signed)
I connected with Corey [name] on 07/17/2023 at 1313 by telephone and verified that I am speaking with the correct person using two identifiers. According to the patient's chart they are due for follow up  with [facility]. Pt scheduled. There are no transportation issues at this time. Nothing further was needed at the end of our conversation.

## 2023-07-28 DIAGNOSIS — I10 Essential (primary) hypertension: Secondary | ICD-10-CM | POA: Diagnosis not present

## 2023-07-28 DIAGNOSIS — K219 Gastro-esophageal reflux disease without esophagitis: Secondary | ICD-10-CM | POA: Diagnosis not present

## 2023-07-28 DIAGNOSIS — E785 Hyperlipidemia, unspecified: Secondary | ICD-10-CM | POA: Diagnosis not present

## 2023-07-28 DIAGNOSIS — G8929 Other chronic pain: Secondary | ICD-10-CM | POA: Diagnosis not present

## 2023-07-28 DIAGNOSIS — M545 Low back pain, unspecified: Secondary | ICD-10-CM | POA: Diagnosis not present

## 2023-07-28 DIAGNOSIS — E669 Obesity, unspecified: Secondary | ICD-10-CM | POA: Diagnosis not present

## 2023-07-28 DIAGNOSIS — Z008 Encounter for other general examination: Secondary | ICD-10-CM | POA: Diagnosis not present

## 2023-07-28 DIAGNOSIS — N393 Stress incontinence (female) (male): Secondary | ICD-10-CM | POA: Diagnosis not present

## 2023-07-28 DIAGNOSIS — Z6830 Body mass index (BMI) 30.0-30.9, adult: Secondary | ICD-10-CM | POA: Diagnosis not present

## 2023-07-28 DIAGNOSIS — E119 Type 2 diabetes mellitus without complications: Secondary | ICD-10-CM | POA: Diagnosis not present

## 2023-07-28 DIAGNOSIS — N529 Male erectile dysfunction, unspecified: Secondary | ICD-10-CM | POA: Diagnosis not present

## 2023-07-28 DIAGNOSIS — I499 Cardiac arrhythmia, unspecified: Secondary | ICD-10-CM | POA: Diagnosis not present

## 2023-07-28 DIAGNOSIS — Z7984 Long term (current) use of oral hypoglycemic drugs: Secondary | ICD-10-CM | POA: Diagnosis not present

## 2023-07-30 DIAGNOSIS — H353221 Exudative age-related macular degeneration, left eye, with active choroidal neovascularization: Secondary | ICD-10-CM | POA: Diagnosis not present

## 2023-08-18 ENCOUNTER — Other Ambulatory Visit: Payer: Self-pay | Admitting: Internal Medicine

## 2023-08-18 DIAGNOSIS — G8929 Other chronic pain: Secondary | ICD-10-CM

## 2023-08-18 DIAGNOSIS — M5416 Radiculopathy, lumbar region: Secondary | ICD-10-CM

## 2023-09-01 DIAGNOSIS — R419 Unspecified symptoms and signs involving cognitive functions and awareness: Secondary | ICD-10-CM | POA: Diagnosis not present

## 2023-09-01 DIAGNOSIS — F0634 Mood disorder due to known physiological condition with mixed features: Secondary | ICD-10-CM | POA: Diagnosis not present

## 2023-09-03 DIAGNOSIS — F0634 Mood disorder due to known physiological condition with mixed features: Secondary | ICD-10-CM | POA: Diagnosis not present

## 2023-09-03 DIAGNOSIS — R419 Unspecified symptoms and signs involving cognitive functions and awareness: Secondary | ICD-10-CM | POA: Diagnosis not present

## 2023-09-04 DIAGNOSIS — F0634 Mood disorder due to known physiological condition with mixed features: Secondary | ICD-10-CM | POA: Diagnosis not present

## 2023-09-04 DIAGNOSIS — R419 Unspecified symptoms and signs involving cognitive functions and awareness: Secondary | ICD-10-CM | POA: Diagnosis not present

## 2023-09-05 DIAGNOSIS — R419 Unspecified symptoms and signs involving cognitive functions and awareness: Secondary | ICD-10-CM | POA: Diagnosis not present

## 2023-09-05 DIAGNOSIS — F0634 Mood disorder due to known physiological condition with mixed features: Secondary | ICD-10-CM | POA: Diagnosis not present

## 2023-09-10 DIAGNOSIS — F0634 Mood disorder due to known physiological condition with mixed features: Secondary | ICD-10-CM | POA: Diagnosis not present

## 2023-09-10 DIAGNOSIS — R419 Unspecified symptoms and signs involving cognitive functions and awareness: Secondary | ICD-10-CM | POA: Diagnosis not present

## 2023-09-11 DIAGNOSIS — F0634 Mood disorder due to known physiological condition with mixed features: Secondary | ICD-10-CM | POA: Diagnosis not present

## 2023-09-11 DIAGNOSIS — R419 Unspecified symptoms and signs involving cognitive functions and awareness: Secondary | ICD-10-CM | POA: Diagnosis not present

## 2023-09-14 DIAGNOSIS — R419 Unspecified symptoms and signs involving cognitive functions and awareness: Secondary | ICD-10-CM | POA: Diagnosis not present

## 2023-09-14 DIAGNOSIS — F0634 Mood disorder due to known physiological condition with mixed features: Secondary | ICD-10-CM | POA: Diagnosis not present

## 2023-09-21 DIAGNOSIS — R310 Gross hematuria: Secondary | ICD-10-CM | POA: Diagnosis not present

## 2023-09-21 DIAGNOSIS — Z960 Presence of urogenital implants: Secondary | ICD-10-CM | POA: Diagnosis not present

## 2023-09-21 DIAGNOSIS — Z8546 Personal history of malignant neoplasm of prostate: Secondary | ICD-10-CM | POA: Diagnosis not present

## 2023-09-21 LAB — PSA: PSA: 0.15

## 2023-09-24 DIAGNOSIS — F0634 Mood disorder due to known physiological condition with mixed features: Secondary | ICD-10-CM | POA: Diagnosis not present

## 2023-09-24 DIAGNOSIS — R419 Unspecified symptoms and signs involving cognitive functions and awareness: Secondary | ICD-10-CM | POA: Diagnosis not present

## 2023-09-25 DIAGNOSIS — R419 Unspecified symptoms and signs involving cognitive functions and awareness: Secondary | ICD-10-CM | POA: Diagnosis not present

## 2023-09-25 DIAGNOSIS — F0634 Mood disorder due to known physiological condition with mixed features: Secondary | ICD-10-CM | POA: Diagnosis not present

## 2023-09-26 ENCOUNTER — Other Ambulatory Visit: Payer: Self-pay | Admitting: Internal Medicine

## 2023-09-26 DIAGNOSIS — I10 Essential (primary) hypertension: Secondary | ICD-10-CM

## 2023-09-28 ENCOUNTER — Other Ambulatory Visit: Payer: Self-pay | Admitting: Internal Medicine

## 2023-09-28 ENCOUNTER — Encounter: Payer: Self-pay | Admitting: Internal Medicine

## 2023-09-28 DIAGNOSIS — F0634 Mood disorder due to known physiological condition with mixed features: Secondary | ICD-10-CM | POA: Diagnosis not present

## 2023-09-28 DIAGNOSIS — I10 Essential (primary) hypertension: Secondary | ICD-10-CM

## 2023-09-28 DIAGNOSIS — K7581 Nonalcoholic steatohepatitis (NASH): Secondary | ICD-10-CM

## 2023-09-28 DIAGNOSIS — R419 Unspecified symptoms and signs involving cognitive functions and awareness: Secondary | ICD-10-CM | POA: Diagnosis not present

## 2023-09-28 MED ORDER — AMLODIPINE BESYLATE 5 MG PO TABS
5.0000 mg | ORAL_TABLET | Freq: Every day | ORAL | 0 refills | Status: DC
Start: 2023-09-28 — End: 2023-10-21

## 2023-10-06 DIAGNOSIS — Z923 Personal history of irradiation: Secondary | ICD-10-CM | POA: Diagnosis not present

## 2023-10-06 DIAGNOSIS — Z8546 Personal history of malignant neoplasm of prostate: Secondary | ICD-10-CM | POA: Diagnosis not present

## 2023-10-06 DIAGNOSIS — Z9079 Acquired absence of other genital organ(s): Secondary | ICD-10-CM | POA: Diagnosis not present

## 2023-10-06 DIAGNOSIS — Z7984 Long term (current) use of oral hypoglycemic drugs: Secondary | ICD-10-CM | POA: Diagnosis not present

## 2023-10-06 DIAGNOSIS — Z79899 Other long term (current) drug therapy: Secondary | ICD-10-CM | POA: Diagnosis not present

## 2023-10-06 DIAGNOSIS — E1136 Type 2 diabetes mellitus with diabetic cataract: Secondary | ICD-10-CM | POA: Diagnosis not present

## 2023-10-06 DIAGNOSIS — Y842 Radiological procedure and radiotherapy as the cause of abnormal reaction of the patient, or of later complication, without mention of misadventure at the time of the procedure: Secondary | ICD-10-CM | POA: Diagnosis not present

## 2023-10-06 DIAGNOSIS — L598 Other specified disorders of the skin and subcutaneous tissue related to radiation: Secondary | ICD-10-CM | POA: Diagnosis not present

## 2023-10-06 DIAGNOSIS — N3041 Irradiation cystitis with hematuria: Secondary | ICD-10-CM | POA: Diagnosis not present

## 2023-10-08 DIAGNOSIS — H353113 Nonexudative age-related macular degeneration, right eye, advanced atrophic without subfoveal involvement: Secondary | ICD-10-CM | POA: Diagnosis not present

## 2023-10-08 DIAGNOSIS — H353221 Exudative age-related macular degeneration, left eye, with active choroidal neovascularization: Secondary | ICD-10-CM | POA: Diagnosis not present

## 2023-10-08 DIAGNOSIS — H43812 Vitreous degeneration, left eye: Secondary | ICD-10-CM | POA: Diagnosis not present

## 2023-10-08 DIAGNOSIS — H35033 Hypertensive retinopathy, bilateral: Secondary | ICD-10-CM | POA: Diagnosis not present

## 2023-10-08 DIAGNOSIS — H43821 Vitreomacular adhesion, right eye: Secondary | ICD-10-CM | POA: Diagnosis not present

## 2023-10-15 DIAGNOSIS — F0634 Mood disorder due to known physiological condition with mixed features: Secondary | ICD-10-CM | POA: Diagnosis not present

## 2023-10-15 DIAGNOSIS — R419 Unspecified symptoms and signs involving cognitive functions and awareness: Secondary | ICD-10-CM | POA: Diagnosis not present

## 2023-10-16 DIAGNOSIS — R419 Unspecified symptoms and signs involving cognitive functions and awareness: Secondary | ICD-10-CM | POA: Diagnosis not present

## 2023-10-16 DIAGNOSIS — F0634 Mood disorder due to known physiological condition with mixed features: Secondary | ICD-10-CM | POA: Diagnosis not present

## 2023-10-18 DIAGNOSIS — R419 Unspecified symptoms and signs involving cognitive functions and awareness: Secondary | ICD-10-CM | POA: Diagnosis not present

## 2023-10-18 DIAGNOSIS — F0634 Mood disorder due to known physiological condition with mixed features: Secondary | ICD-10-CM | POA: Diagnosis not present

## 2023-10-20 ENCOUNTER — Other Ambulatory Visit: Payer: Self-pay | Admitting: Internal Medicine

## 2023-10-20 DIAGNOSIS — I10 Essential (primary) hypertension: Secondary | ICD-10-CM

## 2023-10-27 ENCOUNTER — Ambulatory Visit: Payer: Medicare HMO | Admitting: Internal Medicine

## 2023-10-27 DIAGNOSIS — R310 Gross hematuria: Secondary | ICD-10-CM | POA: Diagnosis not present

## 2023-10-29 DIAGNOSIS — R419 Unspecified symptoms and signs involving cognitive functions and awareness: Secondary | ICD-10-CM | POA: Diagnosis not present

## 2023-10-29 DIAGNOSIS — F0634 Mood disorder due to known physiological condition with mixed features: Secondary | ICD-10-CM | POA: Diagnosis not present

## 2023-10-30 DIAGNOSIS — R419 Unspecified symptoms and signs involving cognitive functions and awareness: Secondary | ICD-10-CM | POA: Diagnosis not present

## 2023-10-30 DIAGNOSIS — F0634 Mood disorder due to known physiological condition with mixed features: Secondary | ICD-10-CM | POA: Diagnosis not present

## 2023-11-01 DIAGNOSIS — R419 Unspecified symptoms and signs involving cognitive functions and awareness: Secondary | ICD-10-CM | POA: Diagnosis not present

## 2023-11-01 DIAGNOSIS — F0634 Mood disorder due to known physiological condition with mixed features: Secondary | ICD-10-CM | POA: Diagnosis not present

## 2023-11-02 DIAGNOSIS — N3041 Irradiation cystitis with hematuria: Secondary | ICD-10-CM | POA: Diagnosis not present

## 2023-11-02 DIAGNOSIS — L598 Other specified disorders of the skin and subcutaneous tissue related to radiation: Secondary | ICD-10-CM | POA: Diagnosis not present

## 2023-11-02 DIAGNOSIS — Z79899 Other long term (current) drug therapy: Secondary | ICD-10-CM | POA: Diagnosis not present

## 2023-11-02 DIAGNOSIS — Y842 Radiological procedure and radiotherapy as the cause of abnormal reaction of the patient, or of later complication, without mention of misadventure at the time of the procedure: Secondary | ICD-10-CM | POA: Diagnosis not present

## 2023-11-03 DIAGNOSIS — Y842 Radiological procedure and radiotherapy as the cause of abnormal reaction of the patient, or of later complication, without mention of misadventure at the time of the procedure: Secondary | ICD-10-CM | POA: Diagnosis not present

## 2023-11-03 DIAGNOSIS — L598 Other specified disorders of the skin and subcutaneous tissue related to radiation: Secondary | ICD-10-CM | POA: Diagnosis not present

## 2023-11-03 DIAGNOSIS — Z79899 Other long term (current) drug therapy: Secondary | ICD-10-CM | POA: Diagnosis not present

## 2023-11-03 DIAGNOSIS — N3041 Irradiation cystitis with hematuria: Secondary | ICD-10-CM | POA: Diagnosis not present

## 2023-11-04 ENCOUNTER — Ambulatory Visit: Payer: Medicare HMO | Admitting: Internal Medicine

## 2023-11-04 ENCOUNTER — Encounter: Payer: Self-pay | Admitting: Internal Medicine

## 2023-11-04 VITALS — BP 142/86 | HR 79 | Temp 97.7°F | Resp 16 | Ht 70.0 in | Wt 206.6 lb

## 2023-11-04 DIAGNOSIS — E785 Hyperlipidemia, unspecified: Secondary | ICD-10-CM | POA: Diagnosis not present

## 2023-11-04 DIAGNOSIS — L598 Other specified disorders of the skin and subcutaneous tissue related to radiation: Secondary | ICD-10-CM | POA: Diagnosis not present

## 2023-11-04 DIAGNOSIS — Z23 Encounter for immunization: Secondary | ICD-10-CM

## 2023-11-04 DIAGNOSIS — E1165 Type 2 diabetes mellitus with hyperglycemia: Secondary | ICD-10-CM

## 2023-11-04 DIAGNOSIS — I10 Essential (primary) hypertension: Secondary | ICD-10-CM | POA: Diagnosis not present

## 2023-11-04 DIAGNOSIS — R7989 Other specified abnormal findings of blood chemistry: Secondary | ICD-10-CM

## 2023-11-04 DIAGNOSIS — N3041 Irradiation cystitis with hematuria: Secondary | ICD-10-CM | POA: Diagnosis not present

## 2023-11-04 DIAGNOSIS — Y842 Radiological procedure and radiotherapy as the cause of abnormal reaction of the patient, or of later complication, without mention of misadventure at the time of the procedure: Secondary | ICD-10-CM | POA: Diagnosis not present

## 2023-11-04 DIAGNOSIS — Z79899 Other long term (current) drug therapy: Secondary | ICD-10-CM | POA: Diagnosis not present

## 2023-11-04 LAB — CBC WITH DIFFERENTIAL/PLATELET
Basophils Absolute: 0 10*3/uL (ref 0.0–0.1)
Basophils Relative: 0.5 % (ref 0.0–3.0)
Eosinophils Absolute: 0.1 10*3/uL (ref 0.0–0.7)
Eosinophils Relative: 1.8 % (ref 0.0–5.0)
HCT: 42.4 % (ref 39.0–52.0)
Hemoglobin: 13.8 g/dL (ref 13.0–17.0)
Lymphocytes Relative: 11.8 % — ABNORMAL LOW (ref 12.0–46.0)
Lymphs Abs: 0.8 10*3/uL (ref 0.7–4.0)
MCHC: 32.6 g/dL (ref 30.0–36.0)
MCV: 86 fL (ref 78.0–100.0)
Monocytes Absolute: 0.7 10*3/uL (ref 0.1–1.0)
Monocytes Relative: 10.4 % (ref 3.0–12.0)
Neutro Abs: 4.9 10*3/uL (ref 1.4–7.7)
Neutrophils Relative %: 75.5 % (ref 43.0–77.0)
Platelets: 200 10*3/uL (ref 150.0–400.0)
RBC: 4.93 Mil/uL (ref 4.22–5.81)
RDW: 14.7 % (ref 11.5–15.5)
WBC: 6.4 10*3/uL (ref 4.0–10.5)

## 2023-11-04 LAB — HEPATIC FUNCTION PANEL
ALT: 33 U/L (ref 0–53)
AST: 77 U/L — ABNORMAL HIGH (ref 0–37)
Albumin: 4.3 g/dL (ref 3.5–5.2)
Alkaline Phosphatase: 96 U/L (ref 39–117)
Bilirubin, Direct: 0.1 mg/dL (ref 0.0–0.3)
Total Bilirubin: 0.4 mg/dL (ref 0.2–1.2)
Total Protein: 7.3 g/dL (ref 6.0–8.3)

## 2023-11-04 LAB — MICROALBUMIN / CREATININE URINE RATIO
Creatinine,U: 149.2 mg/dL
Microalb Creat Ratio: 45.8 mg/g — ABNORMAL HIGH (ref 0.0–30.0)
Microalb, Ur: 68.4 mg/dL — ABNORMAL HIGH (ref 0.0–1.9)

## 2023-11-04 LAB — BASIC METABOLIC PANEL
BUN: 16 mg/dL (ref 6–23)
CO2: 30 meq/L (ref 19–32)
Calcium: 9.5 mg/dL (ref 8.4–10.5)
Chloride: 103 meq/L (ref 96–112)
Creatinine, Ser: 0.92 mg/dL (ref 0.40–1.50)
GFR: 83.25 mL/min (ref >60.00)
Glucose, Bld: 123 mg/dL — ABNORMAL HIGH (ref 70–99)
Potassium: 4.4 meq/L (ref 3.5–5.1)
Sodium: 139 meq/L (ref 135–145)

## 2023-11-04 LAB — HEMOGLOBIN A1C: Hgb A1c MFr Bld: 6.1 % (ref 4.6–6.5)

## 2023-11-04 MED ORDER — AMLODIPINE BESYLATE 5 MG PO TABS: 5.0000 mg | ORAL_TABLET | Freq: Every day | ORAL | 1 refills | Status: DC

## 2023-11-04 MED ORDER — BOOSTRIX 5-2.5-18.5 LF-MCG/0.5 IM SUSP: 0.5000 mL | Freq: Once | INTRAMUSCULAR | 0 refills | Status: AC

## 2023-11-04 MED ORDER — ATORVASTATIN CALCIUM 10 MG PO TABS: 10.0000 mg | ORAL_TABLET | Freq: Every day | ORAL | 1 refills | Status: DC

## 2023-11-04 NOTE — Patient Instructions (Signed)
Hypertension, Adult High blood pressure (hypertension) is when the force of blood pumping through the arteries is too strong. The arteries are the blood vessels that carry blood from the heart throughout the body. Hypertension forces the heart to work harder to pump blood and may cause arteries to become narrow or stiff. Untreated or uncontrolled hypertension can lead to a heart attack, heart failure, a stroke, kidney disease, and other problems. A blood pressure reading consists of a higher number over a lower number. Ideally, your blood pressure should be below 120/80. The first ("top") number is called the systolic pressure. It is a measure of the pressure in your arteries as your heart beats. The second ("bottom") number is called the diastolic pressure. It is a measure of the pressure in your arteries as the heart relaxes. What are the causes? The exact cause of this condition is not known. There are some conditions that result in high blood pressure. What increases the risk? Certain factors may make you more likely to develop high blood pressure. Some of these risk factors are under your control, including: Smoking. Not getting enough exercise or physical activity. Being overweight. Having too much fat, sugar, calories, or salt (sodium) in your diet. Drinking too much alcohol. Other risk factors include: Having a personal history of heart disease, diabetes, high cholesterol, or kidney disease. Stress. Having a family history of high blood pressure and high cholesterol. Having obstructive sleep apnea. Age. The risk increases with age. What are the signs or symptoms? High blood pressure may not cause symptoms. Very high blood pressure (hypertensive crisis) may cause: Headache. Fast or irregular heartbeats (palpitations). Shortness of breath. Nosebleed. Nausea and vomiting. Vision changes. Severe chest pain, dizziness, and seizures. How is this diagnosed? This condition is diagnosed by  measuring your blood pressure while you are seated, with your arm resting on a flat surface, your legs uncrossed, and your feet flat on the floor. The cuff of the blood pressure monitor will be placed directly against the skin of your upper arm at the level of your heart. Blood pressure should be measured at least twice using the same arm. Certain conditions can cause a difference in blood pressure between your right and left arms. If you have a high blood pressure reading during one visit or you have normal blood pressure with other risk factors, you may be asked to: Return on a different day to have your blood pressure checked again. Monitor your blood pressure at home for 1 week or longer. If you are diagnosed with hypertension, you may have other blood or imaging tests to help your health care provider understand your overall risk for other conditions. How is this treated? This condition is treated by making healthy lifestyle changes, such as eating healthy foods, exercising more, and reducing your alcohol intake. You may be referred for counseling on a healthy diet and physical activity. Your health care provider may prescribe medicine if lifestyle changes are not enough to get your blood pressure under control and if: Your systolic blood pressure is above 130. Your diastolic blood pressure is above 80. Your personal target blood pressure may vary depending on your medical conditions, your age, and other factors. Follow these instructions at home: Eating and drinking  Eat a diet that is high in fiber and potassium, and low in sodium, added sugar, and fat. An example of this eating plan is called the DASH diet. DASH stands for Dietary Approaches to Stop Hypertension. To eat this way: Eat   plenty of fresh fruits and vegetables. Try to fill one half of your plate at each meal with fruits and vegetables. Eat whole grains, such as whole-wheat pasta, brown rice, or whole-grain bread. Fill about one  fourth of your plate with whole grains. Eat or drink low-fat dairy products, such as skim milk or low-fat yogurt. Avoid fatty cuts of meat, processed or cured meats, and poultry with skin. Fill about one fourth of your plate with lean proteins, such as fish, chicken without skin, beans, eggs, or tofu. Avoid pre-made and processed foods. These tend to be higher in sodium, added sugar, and fat. Reduce your daily sodium intake. Many people with hypertension should eat less than 1,500 mg of sodium a day. Do not drink alcohol if: Your health care provider tells you not to drink. You are pregnant, may be pregnant, or are planning to become pregnant. If you drink alcohol: Limit how much you have to: 0-1 drink a day for women. 0-2 drinks a day for men. Know how much alcohol is in your drink. In the [place] one drink equals one 12 oz bottle of beer (355 mL), one 5 oz glass of wine (148 mL), or one 1 oz glass of hard liquor (44 mL). Lifestyle  Work with your health care provider to maintain a healthy body weight or to lose weight. Ask what an ideal weight is for you. Get at least 30 minutes of exercise that causes your heart to beat faster (aerobic exercise) most days of the week. Activities may include walking, swimming, or biking. Include exercise to strengthen your muscles (resistance exercise), such as Pilates or lifting weights, as part of your weekly exercise routine. Try to do these types of exercises for 30 minutes at least 3 days a week. Do not use any products that contain nicotine or tobacco. These products include cigarettes, chewing tobacco, and vaping devices, such as e-cigarettes. If you need help quitting, ask your health care provider. Monitor your blood pressure at home as told by your health care provider. Keep all follow-up visits. This is important. Medicines Take over-the-counter and prescription medicines only as told by your health care provider. Follow directions carefully. Blood  pressure medicines must be taken as prescribed. Do not skip doses of blood pressure medicine. Doing this puts you at risk for problems and can make the medicine less effective. Ask your health care provider about side effects or reactions to medicines that you should watch for. Contact a health care provider if you: Think you are having a reaction to a medicine you are taking. Have headaches that keep coming back (recurring). Feel dizzy. Have swelling in your ankles. Have trouble with your vision. Get help right away if you: Develop a severe headache or confusion. Have unusual weakness or numbness. Feel faint. Have severe pain in your chest or abdomen. Vomit repeatedly. Have trouble breathing. These symptoms may be an emergency. Get help right away. Call 911. Do not wait to see if the symptoms will go away. Do not drive yourself to the hospital. Summary Hypertension is when the force of blood pumping through your arteries is too strong. If this condition is not controlled, it may put you at risk for serious complications. Your personal target blood pressure may vary depending on your medical conditions, your age, and other factors. For most people, a normal blood pressure is less than 120/80. Hypertension is treated with lifestyle changes, medicines, or a combination of both. Lifestyle changes include losing weight, eating a healthy,   low-sodium diet, exercising more, and limiting alcohol. This information is not intended to replace advice given to you by your health care provider. Make sure you discuss any questions you have with your health care provider. Document Revised: 10/22/2021 Document Reviewed: 10/22/2021 Elsevier Patient Education  2024 [employer]  

## 2023-11-04 NOTE — Progress Notes (Unsigned)
Subjective:  Patient ID: Corey [name], male    DOB: 1951/06/28  Age: 72 y.o. MRN: [id number]  CC: Diabetes   HPI Tamarick Kovalcik presents for f/up ----  Discussed the use of AI scribe software for clinical note transcription with the patient, who gave verbal consent to proceed.  History of Present Illness   The patient, with a history of hypertension, has been managing well with the addition of amlodipine to his regimen. He denies any symptoms of chest pain, shortness of breath, dizziness, or lightheadedness. He has been active, working two days a week at a physically demanding job without any cardiovascular symptoms.  He has recently started a second round of hyperbaric oxygen therapy due to the presence of blood clots in the urine, which were smaller than previous ones. He denies any painful urination. He has also undergone a CT scan and cystoscopy in the past two weeks.  The patient has been experiencing LBP, which has improved but is still present. He uses a cane for long walks. He has found relief from the use of THC cartridges, which he started using about a month ago.  He has been taking duloxetine (Cymbalta) and a multivitamin but denies taking any cholesterol medication such as atorvastatin (Lipitor), which was prescribed in April. He reports nocturnal thirst but denies any other diabetic symptoms.  The patient has recently undergone a significant life stressor, with his spouse asking for a divorce three months ago. He continues to live in the same house due to financial constraints.       Outpatient Medications Prior to Visit  Medication Sig Dispense Refill   benazepril (LOTENSIN) 20 MG tablet TAKE 1 TABLET BY MOUTH EVERY DAY 90 tablet 0   DULoxetine (CYMBALTA) 60 MG capsule TAKE 1 CAPSULE BY MOUTH EVERY DAY 90 capsule 0   Ferric Maltol (ACCRUFER) 30 MG CAPS Take 1 capsule (30 mg total) by mouth in the morning and at bedtime. (Patient taking differently: Take 1 capsule by mouth  daily.) 90 capsule 1   Multiple Vitamin (MULTIVITAMIN WITH MINERALS) TABS tablet Take 1 tablet by mouth daily.     zinc gluconate 50 MG tablet Take 1 tablet (50 mg total) by mouth daily. 90 tablet 1   amLODipine (NORVASC) 5 MG tablet TAKE 1 TABLET (5 MG TOTAL) BY MOUTH DAILY. 30 tablet 0   atorvastatin (LIPITOR) 10 MG tablet Take 1 tablet (10 mg total) by mouth daily. 90 tablet 1   No facility-administered medications prior to visit.    ROS Review of Systems  Constitutional:  Negative for appetite change, chills, diaphoresis, fatigue and fever.  HENT: Negative.    Eyes: Negative.   Respiratory: Negative.  Negative for chest tightness, shortness of breath and wheezing.   Cardiovascular:  Negative for chest pain, palpitations and leg swelling.  Gastrointestinal: Negative.  Negative for abdominal pain.  Endocrine: Negative.   Genitourinary:  Positive for hematuria. Negative for difficulty urinating, dysuria and flank pain.  Musculoskeletal:  Positive for back pain. Negative for arthralgias, gait problem, myalgias and neck pain.  Neurological: Negative.  Negative for dizziness, weakness, light-headedness and numbness.  Hematological:  Does not bruise/bleed easily.  Psychiatric/Behavioral: Negative.      Objective:  BP (!) 142/86 (BP Location: Left Arm, Patient Position: Sitting, Cuff Size: Normal)   Pulse 79   Temp 97.7 F (36.5 C) (Oral)   Resp 16   Ht 5\' 10"  (1.778 m)   Wt 206 lb 9.6 oz (93.7 kg)   SpO2  96%   BMI 29.64 kg/m   BP Readings from Last 3 Encounters:  11/04/23 (!) 142/86  04/23/23 120/70  07/15/22 132/80    Wt Readings from Last 3 Encounters:  11/04/23 206 lb 9.6 oz (93.7 kg)  04/27/23 211 lb (95.7 kg)  04/23/23 211 lb (95.7 kg)    Physical Exam Vitals reviewed.  Constitutional:      Appearance: Normal appearance.  HENT:     Mouth/Throat:     Mouth: Mucous membranes are moist.  Eyes:     General: No scleral icterus.    Conjunctiva/sclera:  Conjunctivae normal.  Cardiovascular:     Rate and Rhythm: Normal rate and regular rhythm.     Heart sounds: No murmur heard.    No gallop.  Pulmonary:     Effort: Pulmonary effort is normal.     Breath sounds: No stridor. No wheezing, rhonchi or rales.  Abdominal:     General: Abdomen is flat.     Palpations: There is no mass.     Tenderness: There is no abdominal tenderness. There is no guarding.     Hernia: No hernia is present.  Musculoskeletal:        General: Normal range of motion.     Cervical back: Neck supple.     Right lower leg: No edema.     Left lower leg: No edema.  Lymphadenopathy:     Cervical: No cervical adenopathy.  Skin:    General: Skin is warm and dry.     Coloration: Skin is not pale.  Neurological:     General: No focal deficit present.     Mental Status: He is alert. Mental status is at baseline.  Psychiatric:        Mood and Affect: Mood normal.        Behavior: Behavior normal.     Lab Results  Component Value Date   WBC 6.4 11/04/2023   HGB 13.8 11/04/2023   HCT 42.4 11/04/2023   PLT 200.0 11/04/2023   GLUCOSE 123 (H) 11/04/2023   CHOL 110 04/23/2023   TRIG 58.0 04/23/2023   HDL 44.40 04/23/2023   LDLCALC 54 04/23/2023   ALT 33 11/04/2023   AST 77 (H) 11/04/2023   NA 139 11/04/2023   K 4.4 11/04/2023   CL 103 11/04/2023   CREATININE 0.92 11/04/2023   BUN 16 11/04/2023   CO2 30 11/04/2023   TSH 2.85 04/23/2023   PSA 0.15 09/21/2023   INR 1.1 (H) 04/23/2023   HGBA1C 6.1 11/04/2023   MICROALBUR 68.4 (H) 11/04/2023    CT Abdomen Pelvis W Contrast  Result Date: 05/08/2022 CLINICAL DATA:  Lower abdominal pain x 10 days, intermittent hematuria EXAM: CT ABDOMEN AND PELVIS WITH CONTRAST TECHNIQUE: Multidetector CT imaging of the abdomen and pelvis was performed using the standard protocol following bolus administration of intravenous contrast. RADIATION DOSE REDUCTION: This exam was performed according to the departmental  dose-optimization program which includes automated exposure control, adjustment of the mA and/or kV according to patient size and/or use of iterative reconstruction technique. CONTRAST:  OMNIPAQUE IOHEXOL 300 MG/ML  SOLN COMPARISON:  Radiograph of abdomen done on 05/07/2022 FINDINGS: Lower chest: There is no focal pulmonary consolidation in the lower lung fields. There is no pleural effusion. In image 6 of series 3, there is 4 mm faint nodule in the lingula. Hepatobiliary: No focal abnormality is seen. There is no dilation of bile ducts. Gallbladder is unremarkable. Pancreas: No focal abnormality is seen Spleen:  Spleen measures 14.3 cm in maximum diameter. Adrenals/Urinary Tract: Adrenals are unremarkable. There is no hydronephrosis. There is 1.5 cm low-density structure in the anterior midportion of left kidney. Density measurements are higher than usual for simple cyst. There are no renal or ureteral stones. There is diffuse wall thickening in the urinary bladder. There are small pockets of air in the anterior aspect of the urinary bladder. Stomach/Bowel: Small hiatal hernia is seen. Small bowel loops are not dilated. Appendix is not dilated. There is no significant wall thickening in colon. Diverticulosis is seen in colon without signs of focal acute diverticulitis. Vascular/Lymphatic: Unremarkable. Reproductive: Prostate is not seen. There are coarse calcifications adjacent to the prostatic urethra. Prominence of prostatic urethra suggests possible previous transurethral resection of prostate. There are fluid-filled structures immediately posterior to the rectus muscles in the suprapubic region possibly devices used for management of erectile dysfunction. Other: There is no ascites or pneumoperitoneum. Small umbilical hernia containing fat is seen. Small bilateral inguinal hernias containing fat are noted. Musculoskeletal: Degenerative changes are noted with disc space narrowing and encroachment of neural  foramina at L5-S1 level. There is minimal anterolisthesis at L4-L5 level along with disc space narrowing. IMPRESSION: There is no evidence of intestinal obstruction or pneumoperitoneum. There is no hydronephrosis. Appendix is not dilated. There is diffuse wall thickening in the urinary bladder. This finding suggests possible chronic cystitis. There are small pockets of air in the anterior aspect of the urinary bladder. This may be due to cystitis or suggest recent catheterization. Enlarged spleen. Diverticulosis of colon without signs of focal diverticulitis. Other findings as described in the body of the report. Electronically Signed   By: Ernie Avena M.D.   On: 05/08/2022 11:55    Assessment & Plan:  Type 2 diabetes mellitus with hyperglycemia, without long-term current use of insulin (HCC)- His blood sugar is well controlled. -     Microalbumin / creatinine urine ratio; Future -     Urinalysis, Routine w reflex microscopic; Future -     Hemoglobin A1c; Future -     Basic metabolic panel; Future  Essential hypertension, benign- His BP is adequately well controlled. -     Microalbumin / creatinine urine ratio; Future -     Urinalysis, Routine w reflex microscopic; Future -     CBC with Differential/Platelet; Future -     Basic metabolic panel; Future -     amLODIPine Besylate; Take 1 tablet (5 mg total) by mouth daily.  Dispense: 90 tablet; Refill: 1  LFTs abnormal- AST is elevated. I have asked him to avoid alcohol. -     Hepatic function panel; Future  Need for immunization against influenza -     Flu Vaccine Trivalent High Dose (Fluad)  Need for prophylactic vaccination with combined diphtheria-tetanus-pertussis (DTP) vaccine -     Boostrix; Inject 0.5 mLs into the muscle once for 1 dose.  Dispense: 0.5 mL; Refill: 0  Hyperlipidemia with target LDL less than 130 -     Atorvastatin Calcium; Take 1 tablet (10 mg total) by mouth daily.  Dispense: 90 tablet; Refill: 1      Follow-up: Return in about 6 months (around 05/03/2024).  Sanda Linger, MD

## 2023-11-05 DIAGNOSIS — L598 Other specified disorders of the skin and subcutaneous tissue related to radiation: Secondary | ICD-10-CM | POA: Diagnosis not present

## 2023-11-05 DIAGNOSIS — Y842 Radiological procedure and radiotherapy as the cause of abnormal reaction of the patient, or of later complication, without mention of misadventure at the time of the procedure: Secondary | ICD-10-CM | POA: Diagnosis not present

## 2023-11-05 DIAGNOSIS — N3041 Irradiation cystitis with hematuria: Secondary | ICD-10-CM | POA: Diagnosis not present

## 2023-11-05 DIAGNOSIS — Z79899 Other long term (current) drug therapy: Secondary | ICD-10-CM | POA: Diagnosis not present

## 2023-11-05 LAB — URINALYSIS, ROUTINE W REFLEX MICROSCOPIC
Bilirubin Urine: NEGATIVE
Leukocytes,Ua: NEGATIVE
Nitrite: POSITIVE — AB
Specific Gravity, Urine: 1.025 (ref 1.000–1.030)
Total Protein, Urine: 100 — AB
Urine Glucose: 100 — AB
Urobilinogen, UA: 0.2 (ref 0.0–1.0)
WBC, UA: NONE SEEN (ref 0–?)
pH: 6.5 (ref 5.0–8.0)

## 2023-11-06 DIAGNOSIS — N3041 Irradiation cystitis with hematuria: Secondary | ICD-10-CM | POA: Diagnosis not present

## 2023-11-06 DIAGNOSIS — L598 Other specified disorders of the skin and subcutaneous tissue related to radiation: Secondary | ICD-10-CM | POA: Diagnosis not present

## 2023-11-06 DIAGNOSIS — Y842 Radiological procedure and radiotherapy as the cause of abnormal reaction of the patient, or of later complication, without mention of misadventure at the time of the procedure: Secondary | ICD-10-CM | POA: Diagnosis not present

## 2023-11-06 DIAGNOSIS — Z79899 Other long term (current) drug therapy: Secondary | ICD-10-CM | POA: Diagnosis not present

## 2023-11-09 DIAGNOSIS — L598 Other specified disorders of the skin and subcutaneous tissue related to radiation: Secondary | ICD-10-CM | POA: Diagnosis not present

## 2023-11-09 DIAGNOSIS — Y842 Radiological procedure and radiotherapy as the cause of abnormal reaction of the patient, or of later complication, without mention of misadventure at the time of the procedure: Secondary | ICD-10-CM | POA: Diagnosis not present

## 2023-11-09 DIAGNOSIS — Z79899 Other long term (current) drug therapy: Secondary | ICD-10-CM | POA: Diagnosis not present

## 2023-11-09 DIAGNOSIS — N3041 Irradiation cystitis with hematuria: Secondary | ICD-10-CM | POA: Diagnosis not present

## 2023-11-10 DIAGNOSIS — Z79899 Other long term (current) drug therapy: Secondary | ICD-10-CM | POA: Diagnosis not present

## 2023-11-10 DIAGNOSIS — N3041 Irradiation cystitis with hematuria: Secondary | ICD-10-CM | POA: Diagnosis not present

## 2023-11-10 DIAGNOSIS — Y842 Radiological procedure and radiotherapy as the cause of abnormal reaction of the patient, or of later complication, without mention of misadventure at the time of the procedure: Secondary | ICD-10-CM | POA: Diagnosis not present

## 2023-11-10 DIAGNOSIS — L598 Other specified disorders of the skin and subcutaneous tissue related to radiation: Secondary | ICD-10-CM | POA: Diagnosis not present

## 2023-11-11 DIAGNOSIS — Y842 Radiological procedure and radiotherapy as the cause of abnormal reaction of the patient, or of later complication, without mention of misadventure at the time of the procedure: Secondary | ICD-10-CM | POA: Diagnosis not present

## 2023-11-11 DIAGNOSIS — N3041 Irradiation cystitis with hematuria: Secondary | ICD-10-CM | POA: Diagnosis not present

## 2023-11-11 DIAGNOSIS — L598 Other specified disorders of the skin and subcutaneous tissue related to radiation: Secondary | ICD-10-CM | POA: Diagnosis not present

## 2023-11-11 DIAGNOSIS — Z79899 Other long term (current) drug therapy: Secondary | ICD-10-CM | POA: Diagnosis not present

## 2023-11-12 DIAGNOSIS — N3041 Irradiation cystitis with hematuria: Secondary | ICD-10-CM | POA: Diagnosis not present

## 2023-11-12 DIAGNOSIS — F0634 Mood disorder due to known physiological condition with mixed features: Secondary | ICD-10-CM | POA: Diagnosis not present

## 2023-11-12 DIAGNOSIS — R419 Unspecified symptoms and signs involving cognitive functions and awareness: Secondary | ICD-10-CM | POA: Diagnosis not present

## 2023-11-12 DIAGNOSIS — Y842 Radiological procedure and radiotherapy as the cause of abnormal reaction of the patient, or of later complication, without mention of misadventure at the time of the procedure: Secondary | ICD-10-CM | POA: Diagnosis not present

## 2023-11-12 DIAGNOSIS — Z79899 Other long term (current) drug therapy: Secondary | ICD-10-CM | POA: Diagnosis not present

## 2023-11-12 DIAGNOSIS — L598 Other specified disorders of the skin and subcutaneous tissue related to radiation: Secondary | ICD-10-CM | POA: Diagnosis not present

## 2023-11-13 DIAGNOSIS — Z79899 Other long term (current) drug therapy: Secondary | ICD-10-CM | POA: Diagnosis not present

## 2023-11-13 DIAGNOSIS — R419 Unspecified symptoms and signs involving cognitive functions and awareness: Secondary | ICD-10-CM | POA: Diagnosis not present

## 2023-11-13 DIAGNOSIS — Y842 Radiological procedure and radiotherapy as the cause of abnormal reaction of the patient, or of later complication, without mention of misadventure at the time of the procedure: Secondary | ICD-10-CM | POA: Diagnosis not present

## 2023-11-13 DIAGNOSIS — L598 Other specified disorders of the skin and subcutaneous tissue related to radiation: Secondary | ICD-10-CM | POA: Diagnosis not present

## 2023-11-13 DIAGNOSIS — N3041 Irradiation cystitis with hematuria: Secondary | ICD-10-CM | POA: Diagnosis not present

## 2023-11-13 DIAGNOSIS — F0634 Mood disorder due to known physiological condition with mixed features: Secondary | ICD-10-CM | POA: Diagnosis not present

## 2023-11-15 DIAGNOSIS — F0634 Mood disorder due to known physiological condition with mixed features: Secondary | ICD-10-CM | POA: Diagnosis not present

## 2023-11-15 DIAGNOSIS — R419 Unspecified symptoms and signs involving cognitive functions and awareness: Secondary | ICD-10-CM | POA: Diagnosis not present

## 2023-11-16 DIAGNOSIS — N3041 Irradiation cystitis with hematuria: Secondary | ICD-10-CM | POA: Diagnosis not present

## 2023-11-16 DIAGNOSIS — L598 Other specified disorders of the skin and subcutaneous tissue related to radiation: Secondary | ICD-10-CM | POA: Diagnosis not present

## 2023-11-16 DIAGNOSIS — Y842 Radiological procedure and radiotherapy as the cause of abnormal reaction of the patient, or of later complication, without mention of misadventure at the time of the procedure: Secondary | ICD-10-CM | POA: Diagnosis not present

## 2023-11-16 DIAGNOSIS — Z79899 Other long term (current) drug therapy: Secondary | ICD-10-CM | POA: Diagnosis not present

## 2023-11-17 DIAGNOSIS — Z79899 Other long term (current) drug therapy: Secondary | ICD-10-CM | POA: Diagnosis not present

## 2023-11-17 DIAGNOSIS — L598 Other specified disorders of the skin and subcutaneous tissue related to radiation: Secondary | ICD-10-CM | POA: Diagnosis not present

## 2023-11-17 DIAGNOSIS — N3041 Irradiation cystitis with hematuria: Secondary | ICD-10-CM | POA: Diagnosis not present

## 2023-11-17 DIAGNOSIS — Y842 Radiological procedure and radiotherapy as the cause of abnormal reaction of the patient, or of later complication, without mention of misadventure at the time of the procedure: Secondary | ICD-10-CM | POA: Diagnosis not present

## 2023-11-18 DIAGNOSIS — L598 Other specified disorders of the skin and subcutaneous tissue related to radiation: Secondary | ICD-10-CM | POA: Diagnosis not present

## 2023-11-18 DIAGNOSIS — Y842 Radiological procedure and radiotherapy as the cause of abnormal reaction of the patient, or of later complication, without mention of misadventure at the time of the procedure: Secondary | ICD-10-CM | POA: Diagnosis not present

## 2023-11-18 DIAGNOSIS — N3041 Irradiation cystitis with hematuria: Secondary | ICD-10-CM | POA: Diagnosis not present

## 2023-11-18 DIAGNOSIS — Z79899 Other long term (current) drug therapy: Secondary | ICD-10-CM | POA: Diagnosis not present

## 2023-11-19 DIAGNOSIS — L598 Other specified disorders of the skin and subcutaneous tissue related to radiation: Secondary | ICD-10-CM | POA: Diagnosis not present

## 2023-11-19 DIAGNOSIS — N3041 Irradiation cystitis with hematuria: Secondary | ICD-10-CM | POA: Diagnosis not present

## 2023-11-19 DIAGNOSIS — Y842 Radiological procedure and radiotherapy as the cause of abnormal reaction of the patient, or of later complication, without mention of misadventure at the time of the procedure: Secondary | ICD-10-CM | POA: Diagnosis not present

## 2023-11-19 DIAGNOSIS — Z79899 Other long term (current) drug therapy: Secondary | ICD-10-CM | POA: Diagnosis not present

## 2023-11-20 DIAGNOSIS — L598 Other specified disorders of the skin and subcutaneous tissue related to radiation: Secondary | ICD-10-CM | POA: Diagnosis not present

## 2023-11-20 DIAGNOSIS — Y842 Radiological procedure and radiotherapy as the cause of abnormal reaction of the patient, or of later complication, without mention of misadventure at the time of the procedure: Secondary | ICD-10-CM | POA: Diagnosis not present

## 2023-11-20 DIAGNOSIS — Z79899 Other long term (current) drug therapy: Secondary | ICD-10-CM | POA: Diagnosis not present

## 2023-11-20 DIAGNOSIS — N3041 Irradiation cystitis with hematuria: Secondary | ICD-10-CM | POA: Diagnosis not present

## 2023-11-23 DIAGNOSIS — N3041 Irradiation cystitis with hematuria: Secondary | ICD-10-CM | POA: Diagnosis not present

## 2023-11-23 DIAGNOSIS — Y842 Radiological procedure and radiotherapy as the cause of abnormal reaction of the patient, or of later complication, without mention of misadventure at the time of the procedure: Secondary | ICD-10-CM | POA: Diagnosis not present

## 2023-11-23 DIAGNOSIS — L598 Other specified disorders of the skin and subcutaneous tissue related to radiation: Secondary | ICD-10-CM | POA: Diagnosis not present

## 2023-11-23 DIAGNOSIS — Z79899 Other long term (current) drug therapy: Secondary | ICD-10-CM | POA: Diagnosis not present

## 2023-11-24 DIAGNOSIS — Z79899 Other long term (current) drug therapy: Secondary | ICD-10-CM | POA: Diagnosis not present

## 2023-11-24 DIAGNOSIS — Y842 Radiological procedure and radiotherapy as the cause of abnormal reaction of the patient, or of later complication, without mention of misadventure at the time of the procedure: Secondary | ICD-10-CM | POA: Diagnosis not present

## 2023-11-24 DIAGNOSIS — N3041 Irradiation cystitis with hematuria: Secondary | ICD-10-CM | POA: Diagnosis not present

## 2023-11-24 DIAGNOSIS — L598 Other specified disorders of the skin and subcutaneous tissue related to radiation: Secondary | ICD-10-CM | POA: Diagnosis not present

## 2023-11-30 DIAGNOSIS — N3041 Irradiation cystitis with hematuria: Secondary | ICD-10-CM | POA: Diagnosis not present

## 2023-11-30 DIAGNOSIS — L598 Other specified disorders of the skin and subcutaneous tissue related to radiation: Secondary | ICD-10-CM | POA: Diagnosis not present

## 2023-11-30 DIAGNOSIS — Y842 Radiological procedure and radiotherapy as the cause of abnormal reaction of the patient, or of later complication, without mention of misadventure at the time of the procedure: Secondary | ICD-10-CM | POA: Diagnosis not present

## 2023-11-30 DIAGNOSIS — Z79899 Other long term (current) drug therapy: Secondary | ICD-10-CM | POA: Diagnosis not present

## 2023-12-01 DIAGNOSIS — L598 Other specified disorders of the skin and subcutaneous tissue related to radiation: Secondary | ICD-10-CM | POA: Diagnosis not present

## 2023-12-01 DIAGNOSIS — Z79899 Other long term (current) drug therapy: Secondary | ICD-10-CM | POA: Diagnosis not present

## 2023-12-01 DIAGNOSIS — Y842 Radiological procedure and radiotherapy as the cause of abnormal reaction of the patient, or of later complication, without mention of misadventure at the time of the procedure: Secondary | ICD-10-CM | POA: Diagnosis not present

## 2023-12-01 DIAGNOSIS — N3041 Irradiation cystitis with hematuria: Secondary | ICD-10-CM | POA: Diagnosis not present

## 2023-12-02 ENCOUNTER — Other Ambulatory Visit: Payer: Self-pay | Admitting: Internal Medicine

## 2023-12-02 DIAGNOSIS — N3041 Irradiation cystitis with hematuria: Secondary | ICD-10-CM | POA: Diagnosis not present

## 2023-12-02 DIAGNOSIS — Y842 Radiological procedure and radiotherapy as the cause of abnormal reaction of the patient, or of later complication, without mention of misadventure at the time of the procedure: Secondary | ICD-10-CM | POA: Diagnosis not present

## 2023-12-02 DIAGNOSIS — L598 Other specified disorders of the skin and subcutaneous tissue related to radiation: Secondary | ICD-10-CM | POA: Diagnosis not present

## 2023-12-02 DIAGNOSIS — M545 Low back pain, unspecified: Secondary | ICD-10-CM

## 2023-12-02 DIAGNOSIS — Z79899 Other long term (current) drug therapy: Secondary | ICD-10-CM | POA: Diagnosis not present

## 2023-12-02 DIAGNOSIS — M5416 Radiculopathy, lumbar region: Secondary | ICD-10-CM

## 2023-12-03 DIAGNOSIS — Y842 Radiological procedure and radiotherapy as the cause of abnormal reaction of the patient, or of later complication, without mention of misadventure at the time of the procedure: Secondary | ICD-10-CM | POA: Diagnosis not present

## 2023-12-03 DIAGNOSIS — F0634 Mood disorder due to known physiological condition with mixed features: Secondary | ICD-10-CM | POA: Diagnosis not present

## 2023-12-03 DIAGNOSIS — N3041 Irradiation cystitis with hematuria: Secondary | ICD-10-CM | POA: Diagnosis not present

## 2023-12-03 DIAGNOSIS — Z79899 Other long term (current) drug therapy: Secondary | ICD-10-CM | POA: Diagnosis not present

## 2023-12-03 DIAGNOSIS — L598 Other specified disorders of the skin and subcutaneous tissue related to radiation: Secondary | ICD-10-CM | POA: Diagnosis not present

## 2023-12-03 DIAGNOSIS — R419 Unspecified symptoms and signs involving cognitive functions and awareness: Secondary | ICD-10-CM | POA: Diagnosis not present

## 2023-12-04 DIAGNOSIS — Y842 Radiological procedure and radiotherapy as the cause of abnormal reaction of the patient, or of later complication, without mention of misadventure at the time of the procedure: Secondary | ICD-10-CM | POA: Diagnosis not present

## 2023-12-04 DIAGNOSIS — R419 Unspecified symptoms and signs involving cognitive functions and awareness: Secondary | ICD-10-CM | POA: Diagnosis not present

## 2023-12-04 DIAGNOSIS — F0634 Mood disorder due to known physiological condition with mixed features: Secondary | ICD-10-CM | POA: Diagnosis not present

## 2023-12-04 DIAGNOSIS — L598 Other specified disorders of the skin and subcutaneous tissue related to radiation: Secondary | ICD-10-CM | POA: Diagnosis not present

## 2023-12-04 DIAGNOSIS — Z79899 Other long term (current) drug therapy: Secondary | ICD-10-CM | POA: Diagnosis not present

## 2023-12-04 DIAGNOSIS — N3041 Irradiation cystitis with hematuria: Secondary | ICD-10-CM | POA: Diagnosis not present

## 2023-12-06 DIAGNOSIS — R419 Unspecified symptoms and signs involving cognitive functions and awareness: Secondary | ICD-10-CM | POA: Diagnosis not present

## 2023-12-06 DIAGNOSIS — F0634 Mood disorder due to known physiological condition with mixed features: Secondary | ICD-10-CM | POA: Diagnosis not present

## 2023-12-07 DIAGNOSIS — Z79899 Other long term (current) drug therapy: Secondary | ICD-10-CM | POA: Diagnosis not present

## 2023-12-07 DIAGNOSIS — N3041 Irradiation cystitis with hematuria: Secondary | ICD-10-CM | POA: Diagnosis not present

## 2023-12-07 DIAGNOSIS — Y842 Radiological procedure and radiotherapy as the cause of abnormal reaction of the patient, or of later complication, without mention of misadventure at the time of the procedure: Secondary | ICD-10-CM | POA: Diagnosis not present

## 2023-12-07 DIAGNOSIS — L598 Other specified disorders of the skin and subcutaneous tissue related to radiation: Secondary | ICD-10-CM | POA: Diagnosis not present

## 2023-12-08 DIAGNOSIS — N3041 Irradiation cystitis with hematuria: Secondary | ICD-10-CM | POA: Diagnosis not present

## 2023-12-08 DIAGNOSIS — Z79899 Other long term (current) drug therapy: Secondary | ICD-10-CM | POA: Diagnosis not present

## 2023-12-08 DIAGNOSIS — L598 Other specified disorders of the skin and subcutaneous tissue related to radiation: Secondary | ICD-10-CM | POA: Diagnosis not present

## 2023-12-08 DIAGNOSIS — Y842 Radiological procedure and radiotherapy as the cause of abnormal reaction of the patient, or of later complication, without mention of misadventure at the time of the procedure: Secondary | ICD-10-CM | POA: Diagnosis not present

## 2023-12-09 DIAGNOSIS — L598 Other specified disorders of the skin and subcutaneous tissue related to radiation: Secondary | ICD-10-CM | POA: Diagnosis not present

## 2023-12-09 DIAGNOSIS — N3041 Irradiation cystitis with hematuria: Secondary | ICD-10-CM | POA: Diagnosis not present

## 2023-12-09 DIAGNOSIS — Z79899 Other long term (current) drug therapy: Secondary | ICD-10-CM | POA: Diagnosis not present

## 2023-12-09 DIAGNOSIS — Y842 Radiological procedure and radiotherapy as the cause of abnormal reaction of the patient, or of later complication, without mention of misadventure at the time of the procedure: Secondary | ICD-10-CM | POA: Diagnosis not present

## 2023-12-10 DIAGNOSIS — F0634 Mood disorder due to known physiological condition with mixed features: Secondary | ICD-10-CM | POA: Diagnosis not present

## 2023-12-10 DIAGNOSIS — R419 Unspecified symptoms and signs involving cognitive functions and awareness: Secondary | ICD-10-CM | POA: Diagnosis not present

## 2023-12-11 DIAGNOSIS — N3041 Irradiation cystitis with hematuria: Secondary | ICD-10-CM | POA: Diagnosis not present

## 2023-12-11 DIAGNOSIS — R419 Unspecified symptoms and signs involving cognitive functions and awareness: Secondary | ICD-10-CM | POA: Diagnosis not present

## 2023-12-11 DIAGNOSIS — Y842 Radiological procedure and radiotherapy as the cause of abnormal reaction of the patient, or of later complication, without mention of misadventure at the time of the procedure: Secondary | ICD-10-CM | POA: Diagnosis not present

## 2023-12-11 DIAGNOSIS — L598 Other specified disorders of the skin and subcutaneous tissue related to radiation: Secondary | ICD-10-CM | POA: Diagnosis not present

## 2023-12-11 DIAGNOSIS — F0634 Mood disorder due to known physiological condition with mixed features: Secondary | ICD-10-CM | POA: Diagnosis not present

## 2023-12-11 DIAGNOSIS — Z79899 Other long term (current) drug therapy: Secondary | ICD-10-CM | POA: Diagnosis not present

## 2023-12-13 DIAGNOSIS — R419 Unspecified symptoms and signs involving cognitive functions and awareness: Secondary | ICD-10-CM | POA: Diagnosis not present

## 2023-12-13 DIAGNOSIS — F0634 Mood disorder due to known physiological condition with mixed features: Secondary | ICD-10-CM | POA: Diagnosis not present

## 2023-12-14 DIAGNOSIS — L598 Other specified disorders of the skin and subcutaneous tissue related to radiation: Secondary | ICD-10-CM | POA: Diagnosis not present

## 2023-12-14 DIAGNOSIS — Z79899 Other long term (current) drug therapy: Secondary | ICD-10-CM | POA: Diagnosis not present

## 2023-12-14 DIAGNOSIS — Y842 Radiological procedure and radiotherapy as the cause of abnormal reaction of the patient, or of later complication, without mention of misadventure at the time of the procedure: Secondary | ICD-10-CM | POA: Diagnosis not present

## 2023-12-14 DIAGNOSIS — N3041 Irradiation cystitis with hematuria: Secondary | ICD-10-CM | POA: Diagnosis not present

## 2023-12-15 DIAGNOSIS — Z79899 Other long term (current) drug therapy: Secondary | ICD-10-CM | POA: Diagnosis not present

## 2023-12-15 DIAGNOSIS — Y842 Radiological procedure and radiotherapy as the cause of abnormal reaction of the patient, or of later complication, without mention of misadventure at the time of the procedure: Secondary | ICD-10-CM | POA: Diagnosis not present

## 2023-12-15 DIAGNOSIS — N3041 Irradiation cystitis with hematuria: Secondary | ICD-10-CM | POA: Diagnosis not present

## 2023-12-15 DIAGNOSIS — L598 Other specified disorders of the skin and subcutaneous tissue related to radiation: Secondary | ICD-10-CM | POA: Diagnosis not present

## 2023-12-16 DIAGNOSIS — N3041 Irradiation cystitis with hematuria: Secondary | ICD-10-CM | POA: Diagnosis not present

## 2023-12-16 DIAGNOSIS — L598 Other specified disorders of the skin and subcutaneous tissue related to radiation: Secondary | ICD-10-CM | POA: Diagnosis not present

## 2023-12-16 DIAGNOSIS — Y842 Radiological procedure and radiotherapy as the cause of abnormal reaction of the patient, or of later complication, without mention of misadventure at the time of the procedure: Secondary | ICD-10-CM | POA: Diagnosis not present

## 2023-12-16 DIAGNOSIS — Z79899 Other long term (current) drug therapy: Secondary | ICD-10-CM | POA: Diagnosis not present

## 2023-12-17 DIAGNOSIS — Z79899 Other long term (current) drug therapy: Secondary | ICD-10-CM | POA: Diagnosis not present

## 2023-12-17 DIAGNOSIS — L598 Other specified disorders of the skin and subcutaneous tissue related to radiation: Secondary | ICD-10-CM | POA: Diagnosis not present

## 2023-12-17 DIAGNOSIS — N3041 Irradiation cystitis with hematuria: Secondary | ICD-10-CM | POA: Diagnosis not present

## 2023-12-17 DIAGNOSIS — Y842 Radiological procedure and radiotherapy as the cause of abnormal reaction of the patient, or of later complication, without mention of misadventure at the time of the procedure: Secondary | ICD-10-CM | POA: Diagnosis not present

## 2023-12-18 DIAGNOSIS — Y842 Radiological procedure and radiotherapy as the cause of abnormal reaction of the patient, or of later complication, without mention of misadventure at the time of the procedure: Secondary | ICD-10-CM | POA: Diagnosis not present

## 2023-12-18 DIAGNOSIS — N3041 Irradiation cystitis with hematuria: Secondary | ICD-10-CM | POA: Diagnosis not present

## 2023-12-18 DIAGNOSIS — L598 Other specified disorders of the skin and subcutaneous tissue related to radiation: Secondary | ICD-10-CM | POA: Diagnosis not present

## 2023-12-18 DIAGNOSIS — Z79899 Other long term (current) drug therapy: Secondary | ICD-10-CM | POA: Diagnosis not present

## 2023-12-21 DIAGNOSIS — L598 Other specified disorders of the skin and subcutaneous tissue related to radiation: Secondary | ICD-10-CM | POA: Diagnosis not present

## 2023-12-21 DIAGNOSIS — Z79899 Other long term (current) drug therapy: Secondary | ICD-10-CM | POA: Diagnosis not present

## 2023-12-21 DIAGNOSIS — Y842 Radiological procedure and radiotherapy as the cause of abnormal reaction of the patient, or of later complication, without mention of misadventure at the time of the procedure: Secondary | ICD-10-CM | POA: Diagnosis not present

## 2023-12-21 DIAGNOSIS — N3041 Irradiation cystitis with hematuria: Secondary | ICD-10-CM | POA: Diagnosis not present

## 2023-12-22 DIAGNOSIS — Z79899 Other long term (current) drug therapy: Secondary | ICD-10-CM | POA: Diagnosis not present

## 2023-12-22 DIAGNOSIS — L598 Other specified disorders of the skin and subcutaneous tissue related to radiation: Secondary | ICD-10-CM | POA: Diagnosis not present

## 2023-12-22 DIAGNOSIS — N3041 Irradiation cystitis with hematuria: Secondary | ICD-10-CM | POA: Diagnosis not present

## 2023-12-22 DIAGNOSIS — Y842 Radiological procedure and radiotherapy as the cause of abnormal reaction of the patient, or of later complication, without mention of misadventure at the time of the procedure: Secondary | ICD-10-CM | POA: Diagnosis not present

## 2023-12-24 DIAGNOSIS — Z79899 Other long term (current) drug therapy: Secondary | ICD-10-CM | POA: Diagnosis not present

## 2023-12-24 DIAGNOSIS — Y842 Radiological procedure and radiotherapy as the cause of abnormal reaction of the patient, or of later complication, without mention of misadventure at the time of the procedure: Secondary | ICD-10-CM | POA: Diagnosis not present

## 2023-12-24 DIAGNOSIS — N3041 Irradiation cystitis with hematuria: Secondary | ICD-10-CM | POA: Diagnosis not present

## 2023-12-24 DIAGNOSIS — L598 Other specified disorders of the skin and subcutaneous tissue related to radiation: Secondary | ICD-10-CM | POA: Diagnosis not present

## 2023-12-25 DIAGNOSIS — N3041 Irradiation cystitis with hematuria: Secondary | ICD-10-CM | POA: Diagnosis not present

## 2023-12-25 DIAGNOSIS — Z79899 Other long term (current) drug therapy: Secondary | ICD-10-CM | POA: Diagnosis not present

## 2023-12-25 DIAGNOSIS — Y842 Radiological procedure and radiotherapy as the cause of abnormal reaction of the patient, or of later complication, without mention of misadventure at the time of the procedure: Secondary | ICD-10-CM | POA: Diagnosis not present

## 2023-12-25 DIAGNOSIS — L598 Other specified disorders of the skin and subcutaneous tissue related to radiation: Secondary | ICD-10-CM | POA: Diagnosis not present

## 2023-12-28 DIAGNOSIS — Y842 Radiological procedure and radiotherapy as the cause of abnormal reaction of the patient, or of later complication, without mention of misadventure at the time of the procedure: Secondary | ICD-10-CM | POA: Diagnosis not present

## 2023-12-28 DIAGNOSIS — N3041 Irradiation cystitis with hematuria: Secondary | ICD-10-CM | POA: Diagnosis not present

## 2023-12-28 DIAGNOSIS — Z79899 Other long term (current) drug therapy: Secondary | ICD-10-CM | POA: Diagnosis not present

## 2023-12-28 DIAGNOSIS — L598 Other specified disorders of the skin and subcutaneous tissue related to radiation: Secondary | ICD-10-CM | POA: Diagnosis not present

## 2023-12-29 DIAGNOSIS — R338 Other retention of urine: Secondary | ICD-10-CM | POA: Diagnosis not present

## 2023-12-29 DIAGNOSIS — N3041 Irradiation cystitis with hematuria: Secondary | ICD-10-CM | POA: Diagnosis not present

## 2023-12-29 DIAGNOSIS — Z79899 Other long term (current) drug therapy: Secondary | ICD-10-CM | POA: Diagnosis not present

## 2023-12-29 DIAGNOSIS — L598 Other specified disorders of the skin and subcutaneous tissue related to radiation: Secondary | ICD-10-CM | POA: Diagnosis not present

## 2023-12-29 DIAGNOSIS — Y842 Radiological procedure and radiotherapy as the cause of abnormal reaction of the patient, or of later complication, without mention of misadventure at the time of the procedure: Secondary | ICD-10-CM | POA: Diagnosis not present

## 2023-12-31 DIAGNOSIS — Y842 Radiological procedure and radiotherapy as the cause of abnormal reaction of the patient, or of later complication, without mention of misadventure at the time of the procedure: Secondary | ICD-10-CM | POA: Diagnosis not present

## 2023-12-31 DIAGNOSIS — N3041 Irradiation cystitis with hematuria: Secondary | ICD-10-CM | POA: Diagnosis not present

## 2023-12-31 DIAGNOSIS — Z79899 Other long term (current) drug therapy: Secondary | ICD-10-CM | POA: Diagnosis not present

## 2023-12-31 DIAGNOSIS — R310 Gross hematuria: Secondary | ICD-10-CM | POA: Diagnosis not present

## 2023-12-31 DIAGNOSIS — L598 Other specified disorders of the skin and subcutaneous tissue related to radiation: Secondary | ICD-10-CM | POA: Diagnosis not present

## 2024-01-01 DIAGNOSIS — N3041 Irradiation cystitis with hematuria: Secondary | ICD-10-CM | POA: Diagnosis not present

## 2024-01-01 DIAGNOSIS — L598 Other specified disorders of the skin and subcutaneous tissue related to radiation: Secondary | ICD-10-CM | POA: Diagnosis not present

## 2024-01-01 DIAGNOSIS — Y842 Radiological procedure and radiotherapy as the cause of abnormal reaction of the patient, or of later complication, without mention of misadventure at the time of the procedure: Secondary | ICD-10-CM | POA: Diagnosis not present

## 2024-01-01 DIAGNOSIS — Z79899 Other long term (current) drug therapy: Secondary | ICD-10-CM | POA: Diagnosis not present

## 2024-01-01 DIAGNOSIS — R310 Gross hematuria: Secondary | ICD-10-CM | POA: Diagnosis not present

## 2024-01-04 DIAGNOSIS — N3041 Irradiation cystitis with hematuria: Secondary | ICD-10-CM | POA: Diagnosis not present

## 2024-01-04 DIAGNOSIS — R310 Gross hematuria: Secondary | ICD-10-CM | POA: Diagnosis not present

## 2024-01-04 DIAGNOSIS — Y842 Radiological procedure and radiotherapy as the cause of abnormal reaction of the patient, or of later complication, without mention of misadventure at the time of the procedure: Secondary | ICD-10-CM | POA: Diagnosis not present

## 2024-01-04 DIAGNOSIS — Z79899 Other long term (current) drug therapy: Secondary | ICD-10-CM | POA: Diagnosis not present

## 2024-01-04 DIAGNOSIS — L598 Other specified disorders of the skin and subcutaneous tissue related to radiation: Secondary | ICD-10-CM | POA: Diagnosis not present

## 2024-01-05 DIAGNOSIS — R310 Gross hematuria: Secondary | ICD-10-CM | POA: Diagnosis not present

## 2024-01-05 DIAGNOSIS — N3041 Irradiation cystitis with hematuria: Secondary | ICD-10-CM | POA: Diagnosis not present

## 2024-01-05 DIAGNOSIS — Z79899 Other long term (current) drug therapy: Secondary | ICD-10-CM | POA: Diagnosis not present

## 2024-01-05 DIAGNOSIS — Y842 Radiological procedure and radiotherapy as the cause of abnormal reaction of the patient, or of later complication, without mention of misadventure at the time of the procedure: Secondary | ICD-10-CM | POA: Diagnosis not present

## 2024-01-05 DIAGNOSIS — L598 Other specified disorders of the skin and subcutaneous tissue related to radiation: Secondary | ICD-10-CM | POA: Diagnosis not present

## 2024-01-06 DIAGNOSIS — N3041 Irradiation cystitis with hematuria: Secondary | ICD-10-CM | POA: Diagnosis not present

## 2024-01-06 DIAGNOSIS — R338 Other retention of urine: Secondary | ICD-10-CM | POA: Diagnosis not present

## 2024-01-06 DIAGNOSIS — R319 Hematuria, unspecified: Secondary | ICD-10-CM | POA: Diagnosis not present

## 2024-01-06 DIAGNOSIS — Y842 Radiological procedure and radiotherapy as the cause of abnormal reaction of the patient, or of later complication, without mention of misadventure at the time of the procedure: Secondary | ICD-10-CM | POA: Diagnosis not present

## 2024-01-06 DIAGNOSIS — Z79899 Other long term (current) drug therapy: Secondary | ICD-10-CM | POA: Diagnosis not present

## 2024-01-06 DIAGNOSIS — R310 Gross hematuria: Secondary | ICD-10-CM | POA: Diagnosis not present

## 2024-01-07 DIAGNOSIS — N320 Bladder-neck obstruction: Secondary | ICD-10-CM | POA: Diagnosis not present

## 2024-01-07 DIAGNOSIS — R419 Unspecified symptoms and signs involving cognitive functions and awareness: Secondary | ICD-10-CM | POA: Diagnosis not present

## 2024-01-07 DIAGNOSIS — Y842 Radiological procedure and radiotherapy as the cause of abnormal reaction of the patient, or of later complication, without mention of misadventure at the time of the procedure: Secondary | ICD-10-CM | POA: Diagnosis not present

## 2024-01-07 DIAGNOSIS — Z8546 Personal history of malignant neoplasm of prostate: Secondary | ICD-10-CM | POA: Diagnosis not present

## 2024-01-07 DIAGNOSIS — R102 Pelvic and perineal pain: Secondary | ICD-10-CM | POA: Diagnosis not present

## 2024-01-07 DIAGNOSIS — L598 Other specified disorders of the skin and subcutaneous tissue related to radiation: Secondary | ICD-10-CM | POA: Diagnosis not present

## 2024-01-07 DIAGNOSIS — N393 Stress incontinence (female) (male): Secondary | ICD-10-CM | POA: Diagnosis not present

## 2024-01-07 DIAGNOSIS — N3041 Irradiation cystitis with hematuria: Secondary | ICD-10-CM | POA: Diagnosis not present

## 2024-01-07 DIAGNOSIS — F0634 Mood disorder due to known physiological condition with mixed features: Secondary | ICD-10-CM | POA: Diagnosis not present

## 2024-01-07 DIAGNOSIS — R310 Gross hematuria: Secondary | ICD-10-CM | POA: Diagnosis not present

## 2024-01-07 DIAGNOSIS — N5231 Erectile dysfunction following radical prostatectomy: Secondary | ICD-10-CM | POA: Diagnosis not present

## 2024-01-07 DIAGNOSIS — Z79899 Other long term (current) drug therapy: Secondary | ICD-10-CM | POA: Diagnosis not present

## 2024-01-08 DIAGNOSIS — Z79899 Other long term (current) drug therapy: Secondary | ICD-10-CM | POA: Diagnosis not present

## 2024-01-08 DIAGNOSIS — F0634 Mood disorder due to known physiological condition with mixed features: Secondary | ICD-10-CM | POA: Diagnosis not present

## 2024-01-08 DIAGNOSIS — R310 Gross hematuria: Secondary | ICD-10-CM | POA: Diagnosis not present

## 2024-01-08 DIAGNOSIS — R419 Unspecified symptoms and signs involving cognitive functions and awareness: Secondary | ICD-10-CM | POA: Diagnosis not present

## 2024-01-08 DIAGNOSIS — L598 Other specified disorders of the skin and subcutaneous tissue related to radiation: Secondary | ICD-10-CM | POA: Diagnosis not present

## 2024-01-08 DIAGNOSIS — N3041 Irradiation cystitis with hematuria: Secondary | ICD-10-CM | POA: Diagnosis not present

## 2024-01-08 DIAGNOSIS — N3040 Irradiation cystitis without hematuria: Secondary | ICD-10-CM | POA: Diagnosis not present

## 2024-01-08 DIAGNOSIS — Y842 Radiological procedure and radiotherapy as the cause of abnormal reaction of the patient, or of later complication, without mention of misadventure at the time of the procedure: Secondary | ICD-10-CM | POA: Diagnosis not present

## 2024-01-10 DIAGNOSIS — R419 Unspecified symptoms and signs involving cognitive functions and awareness: Secondary | ICD-10-CM | POA: Diagnosis not present

## 2024-01-10 DIAGNOSIS — F0634 Mood disorder due to known physiological condition with mixed features: Secondary | ICD-10-CM | POA: Diagnosis not present

## 2024-01-11 DIAGNOSIS — N3041 Irradiation cystitis with hematuria: Secondary | ICD-10-CM | POA: Diagnosis not present

## 2024-01-11 DIAGNOSIS — L598 Other specified disorders of the skin and subcutaneous tissue related to radiation: Secondary | ICD-10-CM | POA: Diagnosis not present

## 2024-01-11 DIAGNOSIS — Y842 Radiological procedure and radiotherapy as the cause of abnormal reaction of the patient, or of later complication, without mention of misadventure at the time of the procedure: Secondary | ICD-10-CM | POA: Diagnosis not present

## 2024-01-11 DIAGNOSIS — Z79899 Other long term (current) drug therapy: Secondary | ICD-10-CM | POA: Diagnosis not present

## 2024-01-11 DIAGNOSIS — R310 Gross hematuria: Secondary | ICD-10-CM | POA: Diagnosis not present

## 2024-01-12 DIAGNOSIS — Y842 Radiological procedure and radiotherapy as the cause of abnormal reaction of the patient, or of later complication, without mention of misadventure at the time of the procedure: Secondary | ICD-10-CM | POA: Diagnosis not present

## 2024-01-12 DIAGNOSIS — R310 Gross hematuria: Secondary | ICD-10-CM | POA: Diagnosis not present

## 2024-01-12 DIAGNOSIS — N3041 Irradiation cystitis with hematuria: Secondary | ICD-10-CM | POA: Diagnosis not present

## 2024-01-12 DIAGNOSIS — L598 Other specified disorders of the skin and subcutaneous tissue related to radiation: Secondary | ICD-10-CM | POA: Diagnosis not present

## 2024-01-12 DIAGNOSIS — Z79899 Other long term (current) drug therapy: Secondary | ICD-10-CM | POA: Diagnosis not present

## 2024-01-13 DIAGNOSIS — R310 Gross hematuria: Secondary | ICD-10-CM | POA: Diagnosis not present

## 2024-01-13 DIAGNOSIS — Z79899 Other long term (current) drug therapy: Secondary | ICD-10-CM | POA: Diagnosis not present

## 2024-01-13 DIAGNOSIS — Y842 Radiological procedure and radiotherapy as the cause of abnormal reaction of the patient, or of later complication, without mention of misadventure at the time of the procedure: Secondary | ICD-10-CM | POA: Diagnosis not present

## 2024-01-13 DIAGNOSIS — L598 Other specified disorders of the skin and subcutaneous tissue related to radiation: Secondary | ICD-10-CM | POA: Diagnosis not present

## 2024-01-13 DIAGNOSIS — N3041 Irradiation cystitis with hematuria: Secondary | ICD-10-CM | POA: Diagnosis not present

## 2024-01-14 DIAGNOSIS — F0634 Mood disorder due to known physiological condition with mixed features: Secondary | ICD-10-CM | POA: Diagnosis not present

## 2024-01-14 DIAGNOSIS — N3041 Irradiation cystitis with hematuria: Secondary | ICD-10-CM | POA: Diagnosis not present

## 2024-01-14 DIAGNOSIS — L598 Other specified disorders of the skin and subcutaneous tissue related to radiation: Secondary | ICD-10-CM | POA: Diagnosis not present

## 2024-01-14 DIAGNOSIS — Y842 Radiological procedure and radiotherapy as the cause of abnormal reaction of the patient, or of later complication, without mention of misadventure at the time of the procedure: Secondary | ICD-10-CM | POA: Diagnosis not present

## 2024-01-14 DIAGNOSIS — R419 Unspecified symptoms and signs involving cognitive functions and awareness: Secondary | ICD-10-CM | POA: Diagnosis not present

## 2024-01-14 DIAGNOSIS — R310 Gross hematuria: Secondary | ICD-10-CM | POA: Diagnosis not present

## 2024-01-14 DIAGNOSIS — Z79899 Other long term (current) drug therapy: Secondary | ICD-10-CM | POA: Diagnosis not present

## 2024-01-15 DIAGNOSIS — R310 Gross hematuria: Secondary | ICD-10-CM | POA: Diagnosis not present

## 2024-01-15 DIAGNOSIS — R419 Unspecified symptoms and signs involving cognitive functions and awareness: Secondary | ICD-10-CM | POA: Diagnosis not present

## 2024-01-15 DIAGNOSIS — Z8546 Personal history of malignant neoplasm of prostate: Secondary | ICD-10-CM | POA: Diagnosis not present

## 2024-01-15 DIAGNOSIS — N209 Urinary calculus, unspecified: Secondary | ICD-10-CM | POA: Diagnosis not present

## 2024-01-15 DIAGNOSIS — Y842 Radiological procedure and radiotherapy as the cause of abnormal reaction of the patient, or of later complication, without mention of misadventure at the time of the procedure: Secondary | ICD-10-CM | POA: Diagnosis not present

## 2024-01-15 DIAGNOSIS — L598 Other specified disorders of the skin and subcutaneous tissue related to radiation: Secondary | ICD-10-CM | POA: Diagnosis not present

## 2024-01-15 DIAGNOSIS — N5231 Erectile dysfunction following radical prostatectomy: Secondary | ICD-10-CM | POA: Diagnosis not present

## 2024-01-15 DIAGNOSIS — N320 Bladder-neck obstruction: Secondary | ICD-10-CM | POA: Diagnosis not present

## 2024-01-15 DIAGNOSIS — F0634 Mood disorder due to known physiological condition with mixed features: Secondary | ICD-10-CM | POA: Diagnosis not present

## 2024-01-15 DIAGNOSIS — N3041 Irradiation cystitis with hematuria: Secondary | ICD-10-CM | POA: Diagnosis not present

## 2024-01-15 DIAGNOSIS — N393 Stress incontinence (female) (male): Secondary | ICD-10-CM | POA: Diagnosis not present

## 2024-01-15 DIAGNOSIS — C61 Malignant neoplasm of prostate: Secondary | ICD-10-CM | POA: Diagnosis not present

## 2024-01-15 DIAGNOSIS — N281 Cyst of kidney, acquired: Secondary | ICD-10-CM | POA: Diagnosis not present

## 2024-01-17 DIAGNOSIS — F0634 Mood disorder due to known physiological condition with mixed features: Secondary | ICD-10-CM | POA: Diagnosis not present

## 2024-01-17 DIAGNOSIS — R419 Unspecified symptoms and signs involving cognitive functions and awareness: Secondary | ICD-10-CM | POA: Diagnosis not present

## 2024-01-19 DIAGNOSIS — Y842 Radiological procedure and radiotherapy as the cause of abnormal reaction of the patient, or of later complication, without mention of misadventure at the time of the procedure: Secondary | ICD-10-CM | POA: Diagnosis not present

## 2024-01-19 DIAGNOSIS — L598 Other specified disorders of the skin and subcutaneous tissue related to radiation: Secondary | ICD-10-CM | POA: Diagnosis not present

## 2024-01-19 DIAGNOSIS — N3041 Irradiation cystitis with hematuria: Secondary | ICD-10-CM | POA: Diagnosis not present

## 2024-01-19 DIAGNOSIS — Z79899 Other long term (current) drug therapy: Secondary | ICD-10-CM | POA: Diagnosis not present

## 2024-01-19 DIAGNOSIS — R310 Gross hematuria: Secondary | ICD-10-CM | POA: Diagnosis not present

## 2024-01-20 DIAGNOSIS — N3040 Irradiation cystitis without hematuria: Secondary | ICD-10-CM | POA: Diagnosis not present

## 2024-01-20 DIAGNOSIS — Z79899 Other long term (current) drug therapy: Secondary | ICD-10-CM | POA: Diagnosis not present

## 2024-01-20 DIAGNOSIS — L598 Other specified disorders of the skin and subcutaneous tissue related to radiation: Secondary | ICD-10-CM | POA: Diagnosis not present

## 2024-01-20 DIAGNOSIS — N3041 Irradiation cystitis with hematuria: Secondary | ICD-10-CM | POA: Diagnosis not present

## 2024-01-20 DIAGNOSIS — Y842 Radiological procedure and radiotherapy as the cause of abnormal reaction of the patient, or of later complication, without mention of misadventure at the time of the procedure: Secondary | ICD-10-CM | POA: Diagnosis not present

## 2024-01-20 DIAGNOSIS — R310 Gross hematuria: Secondary | ICD-10-CM | POA: Diagnosis not present

## 2024-01-21 DIAGNOSIS — Y842 Radiological procedure and radiotherapy as the cause of abnormal reaction of the patient, or of later complication, without mention of misadventure at the time of the procedure: Secondary | ICD-10-CM | POA: Diagnosis not present

## 2024-01-21 DIAGNOSIS — N3040 Irradiation cystitis without hematuria: Secondary | ICD-10-CM | POA: Diagnosis not present

## 2024-01-21 DIAGNOSIS — N3041 Irradiation cystitis with hematuria: Secondary | ICD-10-CM | POA: Diagnosis not present

## 2024-01-21 DIAGNOSIS — Z79899 Other long term (current) drug therapy: Secondary | ICD-10-CM | POA: Diagnosis not present

## 2024-01-21 DIAGNOSIS — R310 Gross hematuria: Secondary | ICD-10-CM | POA: Diagnosis not present

## 2024-01-21 DIAGNOSIS — L598 Other specified disorders of the skin and subcutaneous tissue related to radiation: Secondary | ICD-10-CM | POA: Diagnosis not present

## 2024-01-22 DIAGNOSIS — N3041 Irradiation cystitis with hematuria: Secondary | ICD-10-CM | POA: Diagnosis not present

## 2024-01-22 DIAGNOSIS — Z79899 Other long term (current) drug therapy: Secondary | ICD-10-CM | POA: Diagnosis not present

## 2024-01-22 DIAGNOSIS — L598 Other specified disorders of the skin and subcutaneous tissue related to radiation: Secondary | ICD-10-CM | POA: Diagnosis not present

## 2024-01-22 DIAGNOSIS — Y842 Radiological procedure and radiotherapy as the cause of abnormal reaction of the patient, or of later complication, without mention of misadventure at the time of the procedure: Secondary | ICD-10-CM | POA: Diagnosis not present

## 2024-01-22 DIAGNOSIS — N3040 Irradiation cystitis without hematuria: Secondary | ICD-10-CM | POA: Diagnosis not present

## 2024-01-22 DIAGNOSIS — R310 Gross hematuria: Secondary | ICD-10-CM | POA: Diagnosis not present

## 2024-01-25 DIAGNOSIS — Y842 Radiological procedure and radiotherapy as the cause of abnormal reaction of the patient, or of later complication, without mention of misadventure at the time of the procedure: Secondary | ICD-10-CM | POA: Diagnosis not present

## 2024-01-25 DIAGNOSIS — L598 Other specified disorders of the skin and subcutaneous tissue related to radiation: Secondary | ICD-10-CM | POA: Diagnosis not present

## 2024-01-25 DIAGNOSIS — N3041 Irradiation cystitis with hematuria: Secondary | ICD-10-CM | POA: Diagnosis not present

## 2024-01-25 DIAGNOSIS — Z79899 Other long term (current) drug therapy: Secondary | ICD-10-CM | POA: Diagnosis not present

## 2024-01-25 DIAGNOSIS — R310 Gross hematuria: Secondary | ICD-10-CM | POA: Diagnosis not present

## 2024-01-26 DIAGNOSIS — R310 Gross hematuria: Secondary | ICD-10-CM | POA: Diagnosis not present

## 2024-01-26 DIAGNOSIS — Y842 Radiological procedure and radiotherapy as the cause of abnormal reaction of the patient, or of later complication, without mention of misadventure at the time of the procedure: Secondary | ICD-10-CM | POA: Diagnosis not present

## 2024-01-26 DIAGNOSIS — N3041 Irradiation cystitis with hematuria: Secondary | ICD-10-CM | POA: Diagnosis not present

## 2024-01-26 DIAGNOSIS — L598 Other specified disorders of the skin and subcutaneous tissue related to radiation: Secondary | ICD-10-CM | POA: Diagnosis not present

## 2024-01-26 DIAGNOSIS — Z79899 Other long term (current) drug therapy: Secondary | ICD-10-CM | POA: Diagnosis not present

## 2024-01-27 DIAGNOSIS — L598 Other specified disorders of the skin and subcutaneous tissue related to radiation: Secondary | ICD-10-CM | POA: Diagnosis not present

## 2024-01-27 DIAGNOSIS — N3041 Irradiation cystitis with hematuria: Secondary | ICD-10-CM | POA: Diagnosis not present

## 2024-01-27 DIAGNOSIS — Y842 Radiological procedure and radiotherapy as the cause of abnormal reaction of the patient, or of later complication, without mention of misadventure at the time of the procedure: Secondary | ICD-10-CM | POA: Diagnosis not present

## 2024-01-27 DIAGNOSIS — R310 Gross hematuria: Secondary | ICD-10-CM | POA: Diagnosis not present

## 2024-01-27 DIAGNOSIS — Z79899 Other long term (current) drug therapy: Secondary | ICD-10-CM | POA: Diagnosis not present

## 2024-01-28 DIAGNOSIS — R419 Unspecified symptoms and signs involving cognitive functions and awareness: Secondary | ICD-10-CM | POA: Diagnosis not present

## 2024-01-28 DIAGNOSIS — R310 Gross hematuria: Secondary | ICD-10-CM | POA: Diagnosis not present

## 2024-01-28 DIAGNOSIS — L598 Other specified disorders of the skin and subcutaneous tissue related to radiation: Secondary | ICD-10-CM | POA: Diagnosis not present

## 2024-01-28 DIAGNOSIS — N3041 Irradiation cystitis with hematuria: Secondary | ICD-10-CM | POA: Diagnosis not present

## 2024-01-28 DIAGNOSIS — F0634 Mood disorder due to known physiological condition with mixed features: Secondary | ICD-10-CM | POA: Diagnosis not present

## 2024-01-28 DIAGNOSIS — Y842 Radiological procedure and radiotherapy as the cause of abnormal reaction of the patient, or of later complication, without mention of misadventure at the time of the procedure: Secondary | ICD-10-CM | POA: Diagnosis not present

## 2024-01-28 DIAGNOSIS — Z79899 Other long term (current) drug therapy: Secondary | ICD-10-CM | POA: Diagnosis not present

## 2024-01-29 DIAGNOSIS — F0634 Mood disorder due to known physiological condition with mixed features: Secondary | ICD-10-CM | POA: Diagnosis not present

## 2024-01-29 DIAGNOSIS — R310 Gross hematuria: Secondary | ICD-10-CM | POA: Diagnosis not present

## 2024-01-29 DIAGNOSIS — Y842 Radiological procedure and radiotherapy as the cause of abnormal reaction of the patient, or of later complication, without mention of misadventure at the time of the procedure: Secondary | ICD-10-CM | POA: Diagnosis not present

## 2024-01-29 DIAGNOSIS — L598 Other specified disorders of the skin and subcutaneous tissue related to radiation: Secondary | ICD-10-CM | POA: Diagnosis not present

## 2024-01-29 DIAGNOSIS — Z79899 Other long term (current) drug therapy: Secondary | ICD-10-CM | POA: Diagnosis not present

## 2024-01-29 DIAGNOSIS — R419 Unspecified symptoms and signs involving cognitive functions and awareness: Secondary | ICD-10-CM | POA: Diagnosis not present

## 2024-01-29 DIAGNOSIS — N3041 Irradiation cystitis with hematuria: Secondary | ICD-10-CM | POA: Diagnosis not present

## 2024-01-31 DIAGNOSIS — R419 Unspecified symptoms and signs involving cognitive functions and awareness: Secondary | ICD-10-CM | POA: Diagnosis not present

## 2024-01-31 DIAGNOSIS — F0634 Mood disorder due to known physiological condition with mixed features: Secondary | ICD-10-CM | POA: Diagnosis not present

## 2024-02-01 DIAGNOSIS — N210 Calculus in bladder: Secondary | ICD-10-CM | POA: Diagnosis not present

## 2024-02-01 DIAGNOSIS — Z79899 Other long term (current) drug therapy: Secondary | ICD-10-CM | POA: Diagnosis not present

## 2024-02-01 DIAGNOSIS — I10 Essential (primary) hypertension: Secondary | ICD-10-CM | POA: Diagnosis not present

## 2024-02-01 DIAGNOSIS — E119 Type 2 diabetes mellitus without complications: Secondary | ICD-10-CM | POA: Diagnosis not present

## 2024-02-01 DIAGNOSIS — Z8546 Personal history of malignant neoplasm of prostate: Secondary | ICD-10-CM | POA: Diagnosis not present

## 2024-02-03 DIAGNOSIS — N3041 Irradiation cystitis with hematuria: Secondary | ICD-10-CM | POA: Diagnosis not present

## 2024-02-03 DIAGNOSIS — Z79899 Other long term (current) drug therapy: Secondary | ICD-10-CM | POA: Diagnosis not present

## 2024-02-03 DIAGNOSIS — L598 Other specified disorders of the skin and subcutaneous tissue related to radiation: Secondary | ICD-10-CM | POA: Diagnosis not present

## 2024-02-03 DIAGNOSIS — Y842 Radiological procedure and radiotherapy as the cause of abnormal reaction of the patient, or of later complication, without mention of misadventure at the time of the procedure: Secondary | ICD-10-CM | POA: Diagnosis not present

## 2024-02-04 DIAGNOSIS — Z79899 Other long term (current) drug therapy: Secondary | ICD-10-CM | POA: Diagnosis not present

## 2024-02-04 DIAGNOSIS — Y842 Radiological procedure and radiotherapy as the cause of abnormal reaction of the patient, or of later complication, without mention of misadventure at the time of the procedure: Secondary | ICD-10-CM | POA: Diagnosis not present

## 2024-02-04 DIAGNOSIS — N3041 Irradiation cystitis with hematuria: Secondary | ICD-10-CM | POA: Diagnosis not present

## 2024-02-04 DIAGNOSIS — L598 Other specified disorders of the skin and subcutaneous tissue related to radiation: Secondary | ICD-10-CM | POA: Diagnosis not present

## 2024-02-05 DIAGNOSIS — Y842 Radiological procedure and radiotherapy as the cause of abnormal reaction of the patient, or of later complication, without mention of misadventure at the time of the procedure: Secondary | ICD-10-CM | POA: Diagnosis not present

## 2024-02-05 DIAGNOSIS — L598 Other specified disorders of the skin and subcutaneous tissue related to radiation: Secondary | ICD-10-CM | POA: Diagnosis not present

## 2024-02-05 DIAGNOSIS — N3041 Irradiation cystitis with hematuria: Secondary | ICD-10-CM | POA: Diagnosis not present

## 2024-02-05 DIAGNOSIS — Z79899 Other long term (current) drug therapy: Secondary | ICD-10-CM | POA: Diagnosis not present

## 2024-02-08 DIAGNOSIS — N3041 Irradiation cystitis with hematuria: Secondary | ICD-10-CM | POA: Diagnosis not present

## 2024-02-08 DIAGNOSIS — Y842 Radiological procedure and radiotherapy as the cause of abnormal reaction of the patient, or of later complication, without mention of misadventure at the time of the procedure: Secondary | ICD-10-CM | POA: Diagnosis not present

## 2024-02-08 DIAGNOSIS — Z79899 Other long term (current) drug therapy: Secondary | ICD-10-CM | POA: Diagnosis not present

## 2024-02-08 DIAGNOSIS — L598 Other specified disorders of the skin and subcutaneous tissue related to radiation: Secondary | ICD-10-CM | POA: Diagnosis not present

## 2024-02-09 DIAGNOSIS — N3041 Irradiation cystitis with hematuria: Secondary | ICD-10-CM | POA: Diagnosis not present

## 2024-02-09 DIAGNOSIS — L598 Other specified disorders of the skin and subcutaneous tissue related to radiation: Secondary | ICD-10-CM | POA: Diagnosis not present

## 2024-02-09 DIAGNOSIS — Y842 Radiological procedure and radiotherapy as the cause of abnormal reaction of the patient, or of later complication, without mention of misadventure at the time of the procedure: Secondary | ICD-10-CM | POA: Diagnosis not present

## 2024-02-09 DIAGNOSIS — Z79899 Other long term (current) drug therapy: Secondary | ICD-10-CM | POA: Diagnosis not present

## 2024-02-11 DIAGNOSIS — H353221 Exudative age-related macular degeneration, left eye, with active choroidal neovascularization: Secondary | ICD-10-CM | POA: Diagnosis not present

## 2024-02-11 DIAGNOSIS — F0634 Mood disorder due to known physiological condition with mixed features: Secondary | ICD-10-CM | POA: Diagnosis not present

## 2024-02-11 DIAGNOSIS — H353113 Nonexudative age-related macular degeneration, right eye, advanced atrophic without subfoveal involvement: Secondary | ICD-10-CM | POA: Diagnosis not present

## 2024-02-11 DIAGNOSIS — H43812 Vitreous degeneration, left eye: Secondary | ICD-10-CM | POA: Diagnosis not present

## 2024-02-11 DIAGNOSIS — R419 Unspecified symptoms and signs involving cognitive functions and awareness: Secondary | ICD-10-CM | POA: Diagnosis not present

## 2024-02-11 DIAGNOSIS — H43821 Vitreomacular adhesion, right eye: Secondary | ICD-10-CM | POA: Diagnosis not present

## 2024-02-11 DIAGNOSIS — H35033 Hypertensive retinopathy, bilateral: Secondary | ICD-10-CM | POA: Diagnosis not present

## 2024-02-12 DIAGNOSIS — F0634 Mood disorder due to known physiological condition with mixed features: Secondary | ICD-10-CM | POA: Diagnosis not present

## 2024-02-12 DIAGNOSIS — R419 Unspecified symptoms and signs involving cognitive functions and awareness: Secondary | ICD-10-CM | POA: Diagnosis not present

## 2024-02-23 ENCOUNTER — Other Ambulatory Visit: Payer: Self-pay | Admitting: Internal Medicine

## 2024-02-23 DIAGNOSIS — M5416 Radiculopathy, lumbar region: Secondary | ICD-10-CM

## 2024-02-23 DIAGNOSIS — M545 Other chronic pain: Secondary | ICD-10-CM

## 2024-03-03 DIAGNOSIS — N393 Stress incontinence (female) (male): Secondary | ICD-10-CM | POA: Diagnosis not present

## 2024-03-03 DIAGNOSIS — L598 Other specified disorders of the skin and subcutaneous tissue related to radiation: Secondary | ICD-10-CM | POA: Diagnosis not present

## 2024-03-03 DIAGNOSIS — R310 Gross hematuria: Secondary | ICD-10-CM | POA: Diagnosis not present

## 2024-03-03 DIAGNOSIS — Y842 Radiological procedure and radiotherapy as the cause of abnormal reaction of the patient, or of later complication, without mention of misadventure at the time of the procedure: Secondary | ICD-10-CM | POA: Diagnosis not present

## 2024-03-03 DIAGNOSIS — N5231 Erectile dysfunction following radical prostatectomy: Secondary | ICD-10-CM | POA: Diagnosis not present

## 2024-03-03 DIAGNOSIS — Z8546 Personal history of malignant neoplasm of prostate: Secondary | ICD-10-CM | POA: Diagnosis not present

## 2024-03-03 DIAGNOSIS — R419 Unspecified symptoms and signs involving cognitive functions and awareness: Secondary | ICD-10-CM | POA: Diagnosis not present

## 2024-03-03 DIAGNOSIS — F0634 Mood disorder due to known physiological condition with mixed features: Secondary | ICD-10-CM | POA: Diagnosis not present

## 2024-03-04 DIAGNOSIS — F0634 Mood disorder due to known physiological condition with mixed features: Secondary | ICD-10-CM | POA: Diagnosis not present

## 2024-03-04 DIAGNOSIS — R419 Unspecified symptoms and signs involving cognitive functions and awareness: Secondary | ICD-10-CM | POA: Diagnosis not present

## 2024-03-17 DIAGNOSIS — F0634 Mood disorder due to known physiological condition with mixed features: Secondary | ICD-10-CM | POA: Diagnosis not present

## 2024-03-17 DIAGNOSIS — R419 Unspecified symptoms and signs involving cognitive functions and awareness: Secondary | ICD-10-CM | POA: Diagnosis not present

## 2024-03-18 DIAGNOSIS — R419 Unspecified symptoms and signs involving cognitive functions and awareness: Secondary | ICD-10-CM | POA: Diagnosis not present

## 2024-03-18 DIAGNOSIS — F0634 Mood disorder due to known physiological condition with mixed features: Secondary | ICD-10-CM | POA: Diagnosis not present

## 2024-03-24 DIAGNOSIS — H35033 Hypertensive retinopathy, bilateral: Secondary | ICD-10-CM | POA: Diagnosis not present

## 2024-03-24 DIAGNOSIS — H353221 Exudative age-related macular degeneration, left eye, with active choroidal neovascularization: Secondary | ICD-10-CM | POA: Diagnosis not present

## 2024-03-24 DIAGNOSIS — H43812 Vitreous degeneration, left eye: Secondary | ICD-10-CM | POA: Diagnosis not present

## 2024-03-24 DIAGNOSIS — H43821 Vitreomacular adhesion, right eye: Secondary | ICD-10-CM | POA: Diagnosis not present

## 2024-03-24 DIAGNOSIS — H353113 Nonexudative age-related macular degeneration, right eye, advanced atrophic without subfoveal involvement: Secondary | ICD-10-CM | POA: Diagnosis not present

## 2024-03-31 DIAGNOSIS — R419 Unspecified symptoms and signs involving cognitive functions and awareness: Secondary | ICD-10-CM | POA: Diagnosis not present

## 2024-03-31 DIAGNOSIS — F0634 Mood disorder due to known physiological condition with mixed features: Secondary | ICD-10-CM | POA: Diagnosis not present

## 2024-04-01 DIAGNOSIS — F0634 Mood disorder due to known physiological condition with mixed features: Secondary | ICD-10-CM | POA: Diagnosis not present

## 2024-04-01 DIAGNOSIS — R419 Unspecified symptoms and signs involving cognitive functions and awareness: Secondary | ICD-10-CM | POA: Diagnosis not present

## 2024-04-26 DIAGNOSIS — R419 Unspecified symptoms and signs involving cognitive functions and awareness: Secondary | ICD-10-CM | POA: Diagnosis not present

## 2024-04-26 DIAGNOSIS — F0634 Mood disorder due to known physiological condition with mixed features: Secondary | ICD-10-CM | POA: Diagnosis not present

## 2024-04-27 ENCOUNTER — Ambulatory Visit: Payer: Medicare HMO

## 2024-04-27 VITALS — Ht 70.0 in | Wt 200.0 lb

## 2024-04-27 DIAGNOSIS — Z0000 Encounter for general adult medical examination without abnormal findings: Secondary | ICD-10-CM

## 2024-04-27 DIAGNOSIS — R419 Unspecified symptoms and signs involving cognitive functions and awareness: Secondary | ICD-10-CM | POA: Diagnosis not present

## 2024-04-27 DIAGNOSIS — F0634 Mood disorder due to known physiological condition with mixed features: Secondary | ICD-10-CM | POA: Diagnosis not present

## 2024-04-27 NOTE — Progress Notes (Signed)
 Subjective:   Corey [name] is a 73 y.o. who presents for a Medicare Wellness preventive visit.  Visit Complete: Virtual I connected with  Jerolyn Moore on 04/27/24 by a audio enabled telemedicine application and verified that I am speaking with the correct person using two identifiers.  Patient Location: Home  Provider Location: Office/Clinic  I discussed the limitations of evaluation and management by telemedicine. The patient expressed understanding and agreed to proceed.  Vital Signs: Because this visit was a virtual/telehealth visit, some criteria may be missing or patient reported. Any vitals not documented were not able to be obtained and vitals that have been documented are patient reported.  VideoDeclined- This patient declined Librarian, academic. Therefore the visit was completed with audio only.  Persons Participating in Visit: Patient.  AWV Questionnaire: No: Patient Medicare AWV questionnaire was not completed prior to this visit.  Cardiac Risk Factors include: advanced age (>54men, >69 women);diabetes mellitus;dyslipidemia;hypertension;male gender     Objective:    Today's Vitals   04/27/24 1340  Weight: 200 lb (90.7 kg)  Height: 5\' 10"  (1.778 m)   Body mass index is 28.7 kg/m.     04/27/2024    1:39 PM 04/27/2023    2:44 PM 06/03/2021    3:12 PM 04/09/2020    3:45 PM 07/19/2018    9:34 AM 07/02/2017   11:33 AM 03/12/2017    6:30 PM  Advanced Directives  Does Patient Have a Medical Advance Directive? No No No No No No No  Would patient like information on creating a medical advance directive? Yes (MAU/Ambulatory/Procedural Areas - Information given) No - Patient declined No - Patient declined Yes (ED - Information included in AVS) Yes (ED - Information included in AVS) Yes (Inpatient - patient defers creating a medical advance directive at this time) No - Patient declined    Current Medications (verified) Outpatient Encounter  Medications as of 04/27/2024  Medication Sig   amLODipine  (NORVASC ) 5 MG tablet Take 1 tablet (5 mg total) by mouth daily.   atorvastatin  (LIPITOR) 10 MG tablet Take 1 tablet (10 mg total) by mouth daily.   benazepril  (LOTENSIN ) 20 MG tablet TAKE 1 TABLET BY MOUTH EVERY DAY   DULoxetine  (CYMBALTA ) 60 MG capsule TAKE 1 CAPSULE BY MOUTH EVERY DAY   Ferric Maltol  (ACCRUFER ) 30 MG CAPS Take 1 capsule (30 mg total) by mouth in the morning and at bedtime. (Patient taking differently: Take 1 capsule by mouth daily.)   Multiple Vitamin (MULTIVITAMIN WITH MINERALS) TABS tablet Take 1 tablet by mouth daily.   zinc  gluconate 50 MG tablet Take 1 tablet (50 mg total) by mouth daily.   No facility-administered encounter medications on file as of 04/27/2024.    Allergies (verified) Patient has no known allergies.   History: Past Medical History:  Diagnosis Date   Arthritis    "minor; elbows, wrists" (03/12/2017)   BPH (benign prostatic hyperplasia) 2011   Hypertension    Incisional hernia    Prostate cancer (HCC) 2016   S/P prostatectomy, TURP, radiation in 2017   Past Surgical History:  Procedure Laterality Date   HERNIA REPAIR     INCISIONAL HERNIA REPAIR N/A 03/12/2017   Procedure: LAPAROSCOPIC INCISIONAL HERNIA REPAIR;  Surgeon: Sim Dryer, MD;  Location: [facility] OR;  Service: General;  Laterality: N/A;   INGUINAL HERNIA REPAIR Right ~ 2007   INSERTION OF MESH N/A 03/12/2017   Procedure: INSERTION OF MESH;  Surgeon: Sim Dryer, MD;  Location: [facility] OR;  Service: General;  Laterality: N/A;   LAPAROSCOPIC INCISIONAL / UMBILICAL / VENTRAL HERNIA REPAIR  03/12/2017   IHR w/mesh   PROSTATECTOMY  2017   TRANSURETHRAL RESECTION OF PROSTATE  2017   "after they removed my prostate"   UMBILICAL HERNIA REPAIR  ~ 2012   Family History  Problem Relation Age of Onset   Cancer Mother    Alcohol abuse Sister    Early death Sister    Stroke Neg Hx    Kidney disease Neg Hx    Hypertension Neg Hx     Heart disease Neg Hx    Hyperlipidemia Neg Hx    Diabetes Neg Hx    Depression Neg Hx    COPD Neg Hx    Asthma Neg Hx    Social History   Socioeconomic History   Marital status: Divorced    Spouse name: Not on file   Number of children: 3   Years of education: Not on file   Highest education level: 12th grade  Occupational History   Not on file  Tobacco Use   Smoking status: Never    Passive exposure: Never   Smokeless tobacco: Never  Vaping Use   Vaping status: Never Used  Substance and Sexual Activity   Alcohol use: Yes    Alcohol/week: 5.0 standard drinks of alcohol    Types: 5 Glasses of wine per week    Comment: occ - 2-3x a week   Drug use: Yes    Types: Marijuana   Sexual activity: Yes    Partners: Male    Birth control/protection: None  Other Topics Concern   Not on file  Social History Narrative   Divorce   Social Drivers of Health   Financial Resource Strain: Low Risk  (04/27/2024)   Overall Financial Resource Strain (CARDIA)    Difficulty of Paying Living Expenses: Not hard at all  Food Insecurity: No Food Insecurity (04/27/2024)   Hunger Vital Sign    Worried About Running Out of Food in the Last Year: Never true    Ran Out of Food in the Last Year: Never true  Transportation Needs: No Transportation Needs (04/27/2024)   PRAPARE - Administrator, Civil Service (Medical): No    Lack of Transportation (Non-Medical): No  Physical Activity: Sufficiently Active (04/27/2024)   Exercise Vital Sign    Days of Exercise per Week: 7 days    Minutes of Exercise per Session: 40 min  Stress: No Stress Concern Present (04/27/2024)   [employer] of Occupational Health - Occupational Stress Questionnaire    Feeling of Stress : Not at all  Recent Concern: Stress - Stress Concern Present (04/23/2024)   [employer] of Occupational Health - Occupational Stress Questionnaire    Feeling of Stress : To some extent  Social Connections: Moderately  Integrated (04/27/2024)   Social Connection and Isolation Panel [NHANES]    Frequency of Communication with Friends and Family: More than three times a week    Frequency of Social Gatherings with Friends and Family: More than three times a week    Attends Religious Services: More than 4 times per year    Active Member of [employer] or Organizations: Yes    Attends Engineer, structural: More than 4 times per year    Marital Status: Divorced    Tobacco Counseling Counseling given: No    Clinical Intake:  Pre-visit preparation completed: Yes  Pain : No/denies pain     BMI -  recorded: 28.7 Nutritional Status: BMI 25 -29 Overweight Nutritional Risks: None Diabetes: Yes CBG done?: No Did pt. bring in CBG monitor from home?: No  Lab Results  Component Value Date   HGBA1C 6.1 11/04/2023   HGBA1C 6.0 04/23/2023   HGBA1C 5.6 09/17/2020     How often do you need to have someone help you when you read instructions, pamphlets, or other written materials from your doctor or pharmacy?: 1 - Never  Interpreter Needed?: No  Information entered by :: Kandy Orris, CMA   Activities of Daily Living     04/27/2024    1:42 PM  In your present state of health, do you have any difficulty performing the following activities:  Hearing? 0  Vision? 0  Difficulty concentrating or making decisions? 0  Walking or climbing stairs? 0  Dressing or bathing? 0  Doing errands, shopping? 0  Preparing Food and eating ? N  Using the Toilet? N  In the past six months, have you accidently leaked urine? Y  Comment wears a pad  Do you have problems with loss of bowel control? N  Managing your Medications? N  Managing your Finances? N  Housekeeping or managing your Housekeeping? N    Patient Care Team: Arcadio Knuckles, MD as PCP - General (Internal Medicine) Alexa Andrews, MD as Attending Physician (Ophthalmology) Szabat, Tino Foreman, [facility] (Inactive) as Pharmacist (Pharmacist)  Indicate  any recent Medical Services you may have received from other than [facility] providers in the past year (date may be approximate).     Assessment:   This is a routine wellness examination for Kendric.  Hearing/Vision screen Hearing Screening - Comments:: Denies hearing difficulties   Vision Screening - Comments:: Wears eyeglasses for reading - up to date with routine eye exams with Dr Gaylene Kays   Goals Addressed               This Visit's Progress     Weight (lb) < 200 lb (90.7 kg) (pt-stated)   200 lb (90.7 kg)     Patient stated he wants to lose about 20-25lbs.       Depression Screen     04/27/2024    1:45 PM 04/27/2023    2:40 PM 07/15/2022    9:49 AM 06/11/2022    3:51 PM 04/09/2022   11:06 AM 06/03/2021    3:11 PM 04/09/2020    3:45 PM  PHQ 2/9 Scores  PHQ - 2 Score 0 0 0 0 0 0 0  PHQ- 9 Score 0 0         Fall Risk     04/27/2024    1:43 PM 04/27/2023    2:45 PM 04/23/2023   10:44 AM 07/15/2022    9:49 AM 06/11/2022    3:53 PM  Fall Risk   Falls in the past year? 0 0 0 0 0  Number falls in past yr: 0 0   0  Injury with Fall? 0 0   0  Risk for fall due to : No Fall Risks No Fall Risks   No Fall Risks  Follow up Falls prevention discussed;Falls evaluation completed Falls prevention discussed   Falls evaluation completed    MEDICARE RISK AT HOME:  Medicare Risk at Home Any stairs in or around the home?: No If so, are there any without handrails?: Yes Home free of loose throw rugs in walkways, pet beds, electrical cords, etc?: Yes Adequate lighting in your home to reduce risk of falls?:  Yes Life alert?: No Use of a cane, walker or w/c?: Yes (cane) Grab bars in the bathroom?: No Shower chair or bench in shower?: No Elevated toilet seat or a handicapped toilet?: No  TIMED UP AND GO:  Was the test performed?  No  Cognitive Function: 6CIT completed        04/27/2024    1:46 PM 04/27/2023    2:46 PM 04/09/2020    3:47 PM  6CIT Screen  What Year? 0 points 0 points 0  points  What month? 0 points 0 points 0 points  What time? 0 points 0 points 0 points  Count back from 20 0 points 0 points 0 points  Months in reverse 0 points 0 points 0 points  Repeat phrase 2 points 0 points 0 points  Total Score 2 points 0 points 0 points    Immunizations Immunization History  Administered Date(s) Administered   Fluad Quad(high Dose 65+) 10/22/2020   Fluad Trivalent(High Dose 65+) 11/04/2023   Influenza, High Dose Seasonal PF 02/10/2018   Influenza-Unspecified 09/27/2019, 10/15/2021   PFIZER(Purple Top)SARS-COV-2 Vaccination 03/15/2020, 04/10/2020, 10/04/2020, 04/04/2021   Pneumococcal Conjugate-13 06/29/2017   Pneumococcal Polysaccharide-23 08/05/2013, 05/10/2019   Tdap 08/05/2013   Zoster Recombinant(Shingrix ) 10/28/2020, 10/29/2020, 06/20/2021    Screening Tests Health Maintenance  Topic Date Due   DTaP/Tdap/Td (2 - Td or Tdap) 08/06/2023   COVID-19 Vaccine (5 - 2024-25 season) 08/30/2023   OPHTHALMOLOGY EXAM  03/24/2024   FOOT EXAM  04/22/2024   HEMOGLOBIN A1C  05/03/2024   INFLUENZA VACCINE  07/29/2024   Diabetic kidney evaluation - eGFR measurement  11/03/2024   Diabetic kidney evaluation - Urine ACR  11/03/2024   Medicare Annual Wellness (AWV)  04/27/2025   Colonoscopy  03/11/2026   Pneumonia Vaccine 42+ Years old  Completed   Hepatitis C Screening  Completed   Zoster Vaccines- Shingrix   Completed   HPV VACCINES  Aged Out   Meningococcal B Vaccine  Aged Out    Health Maintenance  Health Maintenance Due  Topic Date Due   DTaP/Tdap/Td (2 - Td or Tdap) 08/06/2023   COVID-19 Vaccine (5 - 2024-25 season) 08/30/2023   OPHTHALMOLOGY EXAM  03/24/2024   FOOT EXAM  04/22/2024   Health Maintenance Items Addressed:04/27/2024  Pt plans to get the Tdap and COVID vaccines at local pharmacy.   Additional Screening:  Vision Screening: Recommended annual ophthalmology exams for early detection of glaucoma and other disorders of the eye. Pt stated  he plans to see Dr Gaylene Kays in 3 wks for eye exam.  Dental Screening: Recommended annual dental exams for proper oral hygiene  Community Resource Referral / Chronic Care Management: CRR required this visit?  No   CCM required this visit?  No     Plan:     I have personally reviewed and noted the following in the patient's chart:   Medical and social history Use of alcohol, tobacco or illicit drugs  Current medications and supplements including opioid prescriptions. Patient is not currently taking opioid prescriptions. Functional ability and status Nutritional status Physical activity Advanced directives List of other physicians Hospitalizations, surgeries, and ER visits in previous 12 months Vitals Screenings to include cognitive, depression, and falls Referrals and appointments  In addition, I have reviewed and discussed with patient certain preventive protocols, quality metrics, and best practice recommendations. A written personalized care plan for preventive services as well as general preventive health recommendations were provided to patient.     Patria Bookbinder, CMA  04/27/2024   After Visit Summary: (MyChart) Due to this being a telephonic visit, the after visit summary with patients personalized plan was offered to patient via MyChart   Notes: Nothing significant to report at this time.

## 2024-04-27 NOTE — Patient Instructions (Addendum)
 Corey [name] , Thank you for taking time to come for your Medicare Wellness Visit. I appreciate your ongoing commitment to your health goals. Please review the following plan we discussed and let me know if I can assist you in the future.   Referrals/Orders/Follow-Ups/Clinician Recommendations: Aim for 30 minutes of exercise or brisk walking, 6-8 glasses of water, and 5 servings of fruits and vegetables each day. Educated and advised on getting the Tdap (Tetenus) and COVID vaccines in 2025 at local pharmacy in 2025.  This is a list of the screening recommended for you and due dates:  Health Maintenance  Topic Date Due   DTaP/Tdap/Td vaccine (2 - Td or Tdap) 08/06/2023   COVID-19 Vaccine (5 - 2024-25 season) 08/30/2023   Eye exam for diabetics  03/24/2024   Complete foot exam   04/22/2024   Hemoglobin A1C  05/03/2024   Flu Shot  07/29/2024   Yearly kidney function blood test for diabetes  11/03/2024   Yearly kidney health urinalysis for diabetes  11/03/2024   Medicare Annual Wellness Visit  04/27/2025   Colon Cancer Screening  03/11/2026   Pneumonia Vaccine  Completed   Hepatitis C Screening  Completed   Zoster (Shingles) Vaccine  Completed   HPV Vaccine  Aged Out   Meningitis B Vaccine  Aged Out    Advanced directives: (Provided) Advance directive discussed with you today. I have provided a copy for you to complete at home and have notarized. Once this is complete, please bring a copy in to our office so we can scan it into your chart.   Next Medicare Annual Wellness Visit scheduled for next year: Yes

## 2024-04-28 ENCOUNTER — Other Ambulatory Visit: Payer: Self-pay | Admitting: Internal Medicine

## 2024-04-28 DIAGNOSIS — Z8546 Personal history of malignant neoplasm of prostate: Secondary | ICD-10-CM | POA: Diagnosis not present

## 2024-04-28 DIAGNOSIS — N393 Stress incontinence (female) (male): Secondary | ICD-10-CM | POA: Diagnosis not present

## 2024-04-28 DIAGNOSIS — I10 Essential (primary) hypertension: Secondary | ICD-10-CM

## 2024-04-28 DIAGNOSIS — N3041 Irradiation cystitis with hematuria: Secondary | ICD-10-CM | POA: Diagnosis not present

## 2024-04-28 DIAGNOSIS — R310 Gross hematuria: Secondary | ICD-10-CM | POA: Diagnosis not present

## 2024-04-28 DIAGNOSIS — N5231 Erectile dysfunction following radical prostatectomy: Secondary | ICD-10-CM | POA: Diagnosis not present

## 2024-04-28 LAB — PSA: PSA: 0.2

## 2024-04-29 DIAGNOSIS — R419 Unspecified symptoms and signs involving cognitive functions and awareness: Secondary | ICD-10-CM | POA: Diagnosis not present

## 2024-04-29 DIAGNOSIS — F0634 Mood disorder due to known physiological condition with mixed features: Secondary | ICD-10-CM | POA: Diagnosis not present

## 2024-05-05 DIAGNOSIS — H43812 Vitreous degeneration, left eye: Secondary | ICD-10-CM | POA: Diagnosis not present

## 2024-05-05 DIAGNOSIS — H353221 Exudative age-related macular degeneration, left eye, with active choroidal neovascularization: Secondary | ICD-10-CM | POA: Diagnosis not present

## 2024-05-08 IMAGING — CT CT ABD-PELV W/ CM
2 of 5 series · 15 of 46 positions shown, 17 images · IV contrast (OMNIPAQUE 300)
Comparison: Radiograph of abdomen done on 05/07/2022

CLINICAL DATA: Lower abdominal pain x 10 days, intermittent
hematuria

EXAM:
CT ABDOMEN AND PELVIS WITH CONTRAST
TECHNIQUE: Multidetector CT imaging of the abdomen and pelvis was performed
using the standard protocol following bolus administration of
intravenous contrast.

[Series 2: abd/pel w · axial · 0.79mm/px · z∈[-526,-86]mm · 12 of 100 slices shown, 14 images]
[im 6/100  soft-tissue]
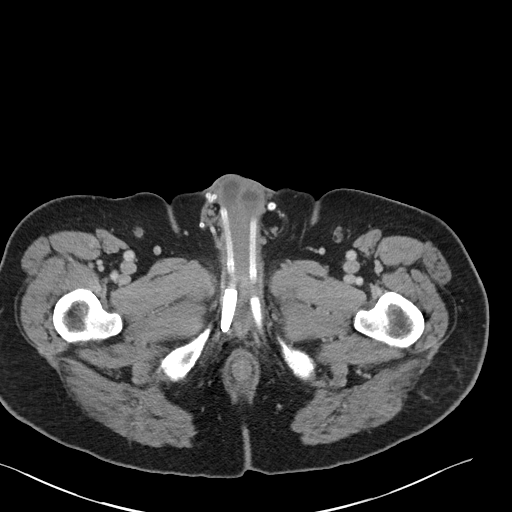
[im 6/100  bone]
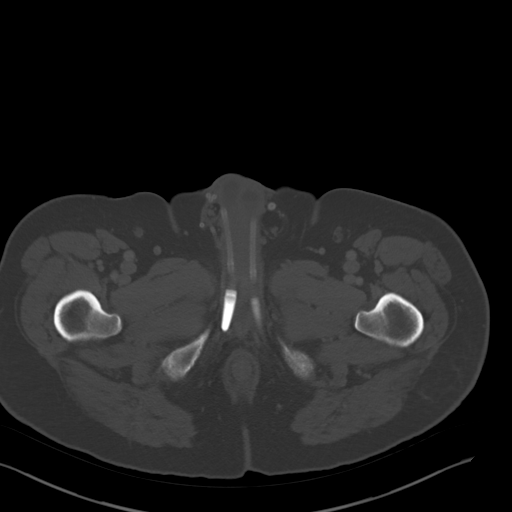
[im 17/100  soft-tissue]
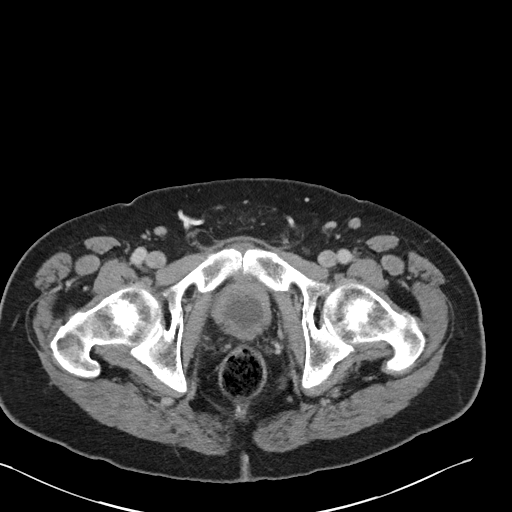
[im 23/100  soft-tissue]
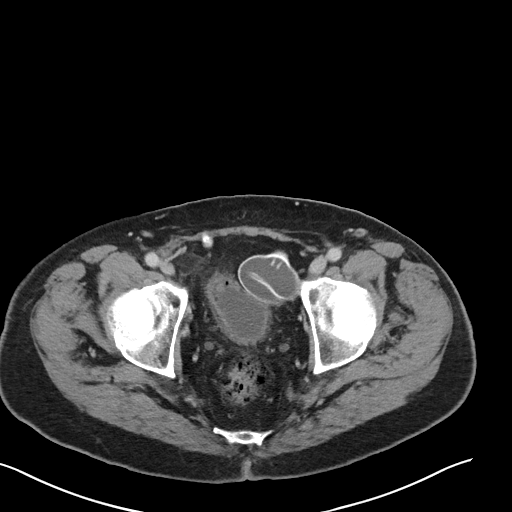
[im 28/100  soft-tissue]
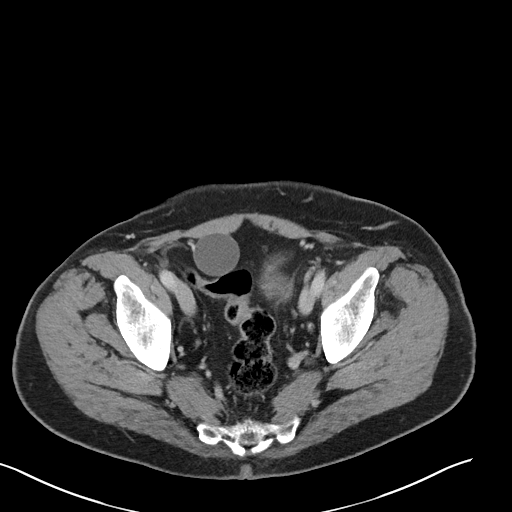
[im 39/100  soft-tissue]
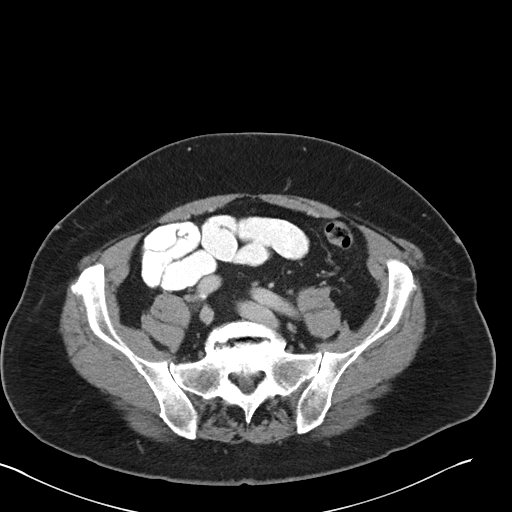
[im 45/100  soft-tissue]
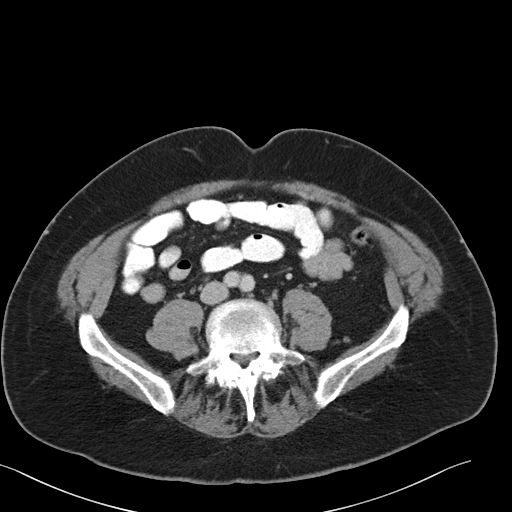
[im 56/100  soft-tissue]
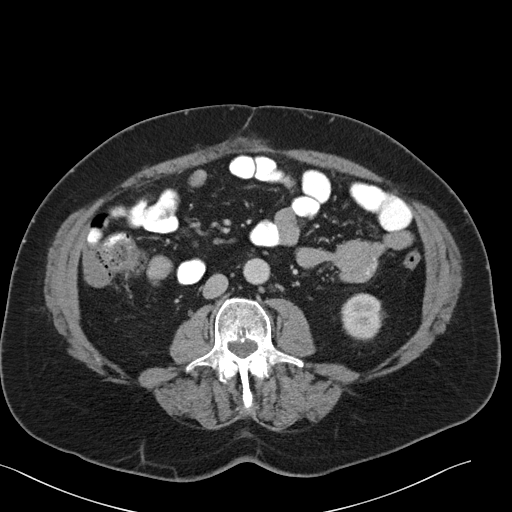
[im 61/100  soft-tissue]
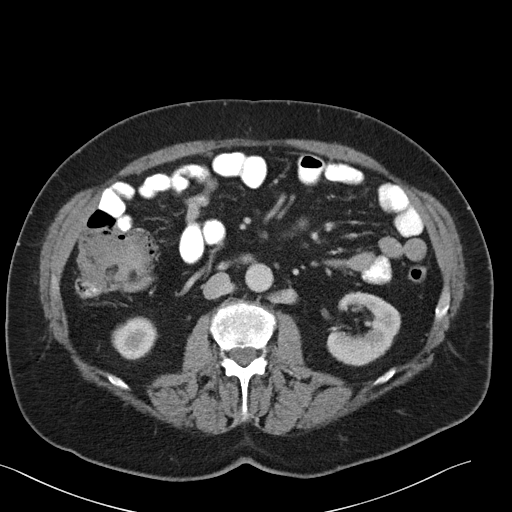
[im 72/100  soft-tissue]
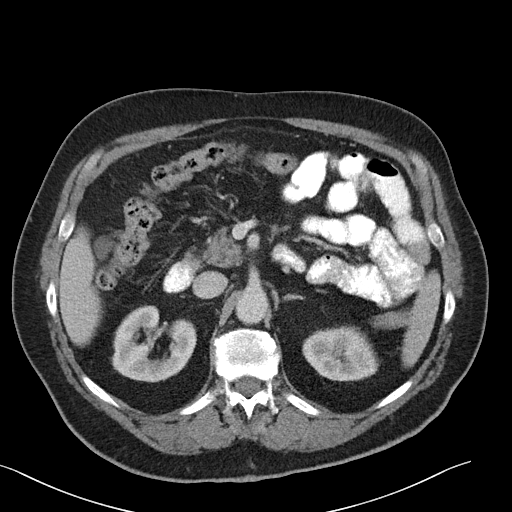
[im 72/100  bone]
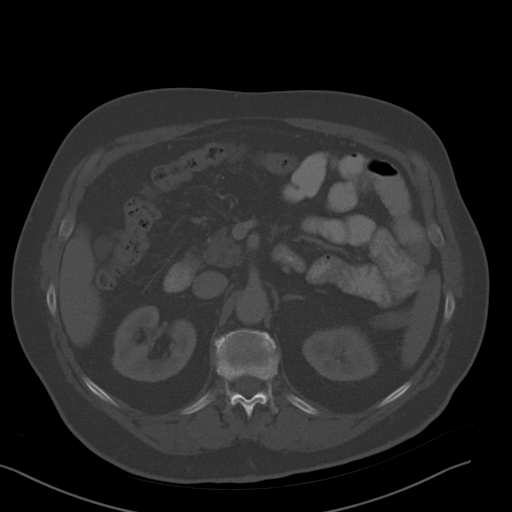
[im 78/100  soft-tissue]
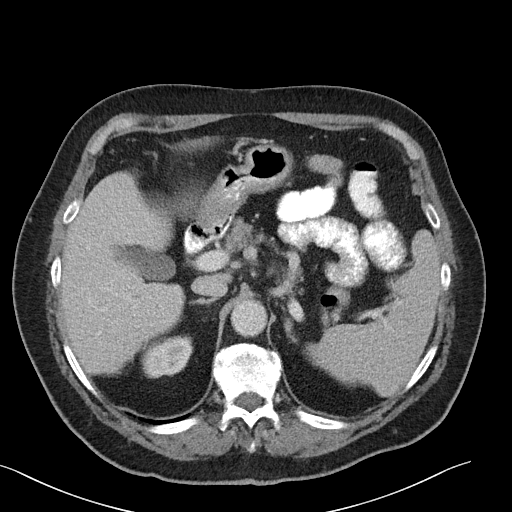
[im 83/100  soft-tissue]
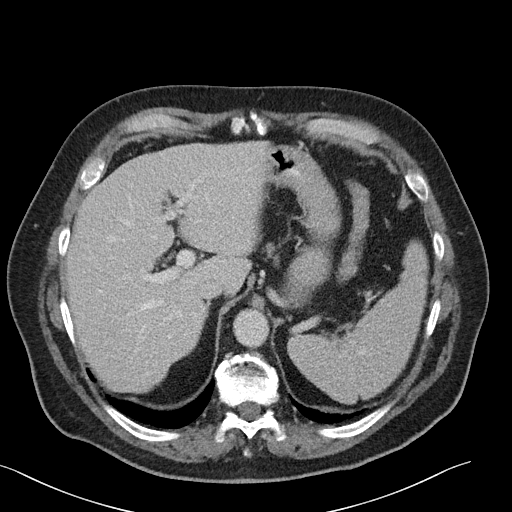
[im 94/100  soft-tissue]
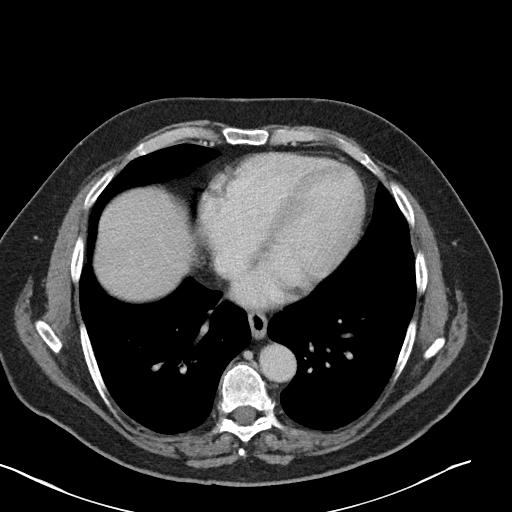

[Series 5: coronal st · coronal · 0.71mm/px · 3 of 88 slices shown]
[im 30/88  soft-tissue]
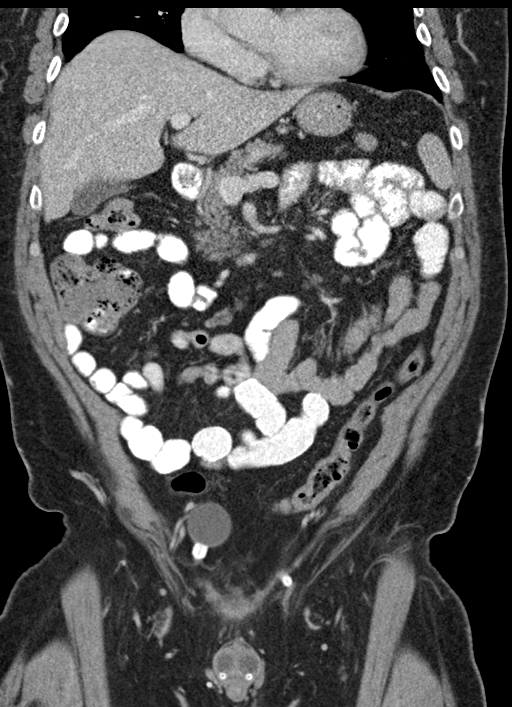
[im 39/88  soft-tissue]
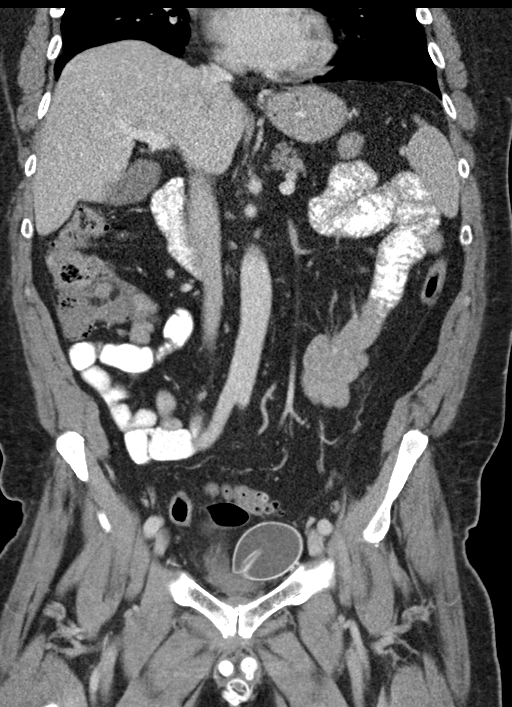
[im 49/88  soft-tissue]
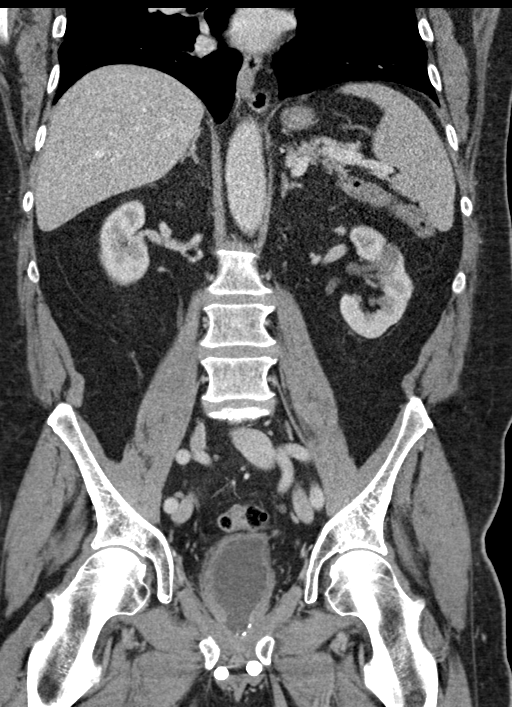

[15 of 46 positions shown; findings below may reference images not displayed]

RADIATION DOSE REDUCTION: This exam was performed according to the
departmental dose-optimization program which includes automated
exposure control, adjustment of the mA and/or kV according to
patient size and/or use of iterative reconstruction technique.

CONTRAST:  100mL OMNIPAQUE IOHEXOL 300 MG/ML  SOLN
FINDINGS: Lower chest: There is no focal pulmonary consolidation in the lower
lung fields. There is no pleural effusion. In image 6 of series 3,
there is 4 mm faint nodule in the lingula.

Hepatobiliary: No focal abnormality is seen. There is no dilation of
bile ducts. Gallbladder is unremarkable.

Pancreas: No focal abnormality is seen

Spleen: Spleen measures 14.3 cm in maximum diameter.

Adrenals/Urinary Tract: Adrenals are unremarkable. There is no
hydronephrosis. There is 1.5 cm low-density structure in the
anterior midportion of left kidney. Density measurements are higher
than usual for simple cyst. There are no renal or ureteral stones.
There is diffuse wall thickening in the urinary bladder. There are
small pockets of air in the anterior aspect of the urinary bladder.

Stomach/Bowel: Small hiatal hernia is seen. Small bowel loops are
not dilated. Appendix is not dilated. There is no significant wall
thickening in colon. Diverticulosis is seen in colon without signs
of focal acute diverticulitis.

Vascular/Lymphatic: Unremarkable.

Reproductive: Prostate is not seen. There are coarse calcifications
adjacent to the prostatic urethra. Prominence of prostatic urethra
suggests possible previous transurethral resection of prostate.
There are fluid-filled structures immediately posterior to the
rectus muscles in the suprapubic region possibly devices used for
management of erectile dysfunction.

Other: There is no ascites or pneumoperitoneum. Small umbilical
hernia containing fat is seen. Small bilateral inguinal hernias
containing fat are noted.

Musculoskeletal: Degenerative changes are noted with disc space
narrowing and encroachment of neural foramina at L5-S1 level. There
is minimal anterolisthesis at L4-L5 level along with disc space
narrowing.
IMPRESSION: There is no evidence of intestinal obstruction or pneumoperitoneum.
There is no hydronephrosis. Appendix is not dilated.

There is diffuse wall thickening in the urinary bladder. This
finding suggests possible chronic cystitis. There are small pockets
of air in the anterior aspect of the urinary bladder. This may be
due to cystitis or suggest recent catheterization.

Enlarged spleen. Diverticulosis of colon without signs of focal
diverticulitis.

Other findings as described in the body of the report.

## 2024-05-10 DIAGNOSIS — R419 Unspecified symptoms and signs involving cognitive functions and awareness: Secondary | ICD-10-CM | POA: Diagnosis not present

## 2024-05-10 DIAGNOSIS — F0634 Mood disorder due to known physiological condition with mixed features: Secondary | ICD-10-CM | POA: Diagnosis not present

## 2024-05-11 DIAGNOSIS — F0634 Mood disorder due to known physiological condition with mixed features: Secondary | ICD-10-CM | POA: Diagnosis not present

## 2024-05-11 DIAGNOSIS — R419 Unspecified symptoms and signs involving cognitive functions and awareness: Secondary | ICD-10-CM | POA: Diagnosis not present

## 2024-05-13 DIAGNOSIS — R419 Unspecified symptoms and signs involving cognitive functions and awareness: Secondary | ICD-10-CM | POA: Diagnosis not present

## 2024-05-13 DIAGNOSIS — F0634 Mood disorder due to known physiological condition with mixed features: Secondary | ICD-10-CM | POA: Diagnosis not present

## 2024-05-24 DIAGNOSIS — F0634 Mood disorder due to known physiological condition with mixed features: Secondary | ICD-10-CM | POA: Diagnosis not present

## 2024-05-24 DIAGNOSIS — R419 Unspecified symptoms and signs involving cognitive functions and awareness: Secondary | ICD-10-CM | POA: Diagnosis not present

## 2024-05-25 DIAGNOSIS — F0634 Mood disorder due to known physiological condition with mixed features: Secondary | ICD-10-CM | POA: Diagnosis not present

## 2024-05-25 DIAGNOSIS — R419 Unspecified symptoms and signs involving cognitive functions and awareness: Secondary | ICD-10-CM | POA: Diagnosis not present

## 2024-05-27 DIAGNOSIS — F0634 Mood disorder due to known physiological condition with mixed features: Secondary | ICD-10-CM | POA: Diagnosis not present

## 2024-05-27 DIAGNOSIS — R419 Unspecified symptoms and signs involving cognitive functions and awareness: Secondary | ICD-10-CM | POA: Diagnosis not present

## 2024-06-03 ENCOUNTER — Other Ambulatory Visit: Payer: Self-pay | Admitting: Internal Medicine

## 2024-06-03 DIAGNOSIS — M5416 Radiculopathy, lumbar region: Secondary | ICD-10-CM

## 2024-06-03 DIAGNOSIS — M545 Other chronic pain: Secondary | ICD-10-CM

## 2024-06-07 ENCOUNTER — Ambulatory Visit: Admitting: Internal Medicine

## 2024-06-10 ENCOUNTER — Encounter: Payer: Self-pay | Admitting: Internal Medicine

## 2024-06-10 ENCOUNTER — Ambulatory Visit (INDEPENDENT_AMBULATORY_CARE_PROVIDER_SITE_OTHER): Admitting: Internal Medicine

## 2024-06-10 VITALS — BP 138/86 | HR 66 | Temp 97.9°F | Resp 16 | Ht 70.0 in | Wt 210.8 lb

## 2024-06-10 DIAGNOSIS — R7989 Other specified abnormal findings of blood chemistry: Secondary | ICD-10-CM | POA: Diagnosis not present

## 2024-06-10 DIAGNOSIS — Z0001 Encounter for general adult medical examination with abnormal findings: Secondary | ICD-10-CM

## 2024-06-10 DIAGNOSIS — E785 Hyperlipidemia, unspecified: Secondary | ICD-10-CM | POA: Diagnosis not present

## 2024-06-10 DIAGNOSIS — R0609 Other forms of dyspnea: Secondary | ICD-10-CM | POA: Diagnosis not present

## 2024-06-10 DIAGNOSIS — E1165 Type 2 diabetes mellitus with hyperglycemia: Secondary | ICD-10-CM | POA: Diagnosis not present

## 2024-06-10 DIAGNOSIS — Z0000 Encounter for general adult medical examination without abnormal findings: Secondary | ICD-10-CM

## 2024-06-10 DIAGNOSIS — I10 Essential (primary) hypertension: Secondary | ICD-10-CM

## 2024-06-10 DIAGNOSIS — Z23 Encounter for immunization: Secondary | ICD-10-CM

## 2024-06-10 LAB — CBC AND DIFFERENTIAL
HCT: 42 (ref 41–53)
Hemoglobin: 13.6 (ref 13.5–17.5)
Platelets: 217 10*3/uL (ref 150–400)
WBC: 5.6

## 2024-06-10 LAB — PROTIME-INR: Protime: 10.7 (ref 10.0–13.8)

## 2024-06-10 LAB — POCT INR: INR: 1 (ref 0.80–1.20)

## 2024-06-10 NOTE — Patient Instructions (Signed)
 Health Maintenance, Male  Adopting a healthy lifestyle and getting preventive care are important in promoting health and wellness. Ask your health care provider about:  The right schedule for you to have regular tests and exams.  Things you can do on your own to prevent diseases and keep yourself healthy.  What should I know about diet, weight, and exercise?  Eat a healthy diet    Eat a diet that includes plenty of vegetables, fruits, low-fat dairy products, and lean protein.  Do not eat a lot of foods that are high in solid fats, added sugars, or sodium.  Maintain a healthy weight  Body mass index (BMI) is a measurement that can be used to identify possible weight problems. It estimates body fat based on height and weight. Your health care provider can help determine your BMI and help you achieve or maintain a healthy weight.  Get regular exercise  Get regular exercise. This is one of the most important things you can do for your health. Most adults should:  Exercise for at least 150 minutes each week. The exercise should increase your heart rate and make you sweat (moderate-intensity exercise).  Do strengthening exercises at least twice a week. This is in addition to the moderate-intensity exercise.  Spend less time sitting. Even light physical activity can be beneficial.  Watch cholesterol and blood lipids  Have your blood tested for lipids and cholesterol at 73 years of age, then have this test every 5 years.  You may need to have your cholesterol levels checked more often if:  Your lipid or cholesterol levels are high.  You are older than 73 years of age.  You are at high risk for heart disease.  What should I know about cancer screening?  Many types of cancers can be detected early and may often be prevented. Depending on your health history and family history, you may need to have cancer screening at various ages. This may include screening for:  Colorectal cancer.  Prostate cancer.  Skin cancer.  Lung  cancer.  What should I know about heart disease, diabetes, and high blood pressure?  Blood pressure and heart disease  High blood pressure causes heart disease and increases the risk of stroke. This is more likely to develop in people who have high blood pressure readings or are overweight.  Talk with your health care provider about your target blood pressure readings.  Have your blood pressure checked:  Every 3-5 years if you are 9-95 years of age.  Every year if you are 85 years old or older.  If you are between the ages of 29 and 29 and are a current or former smoker, ask your health care provider if you should have a one-time screening for abdominal aortic aneurysm (AAA).  Diabetes  Have regular diabetes screenings. This checks your fasting blood sugar level. Have the screening done:  Once every three years after age 23 if you are at a normal weight and have a low risk for diabetes.  More often and at a younger age if you are overweight or have a high risk for diabetes.  What should I know about preventing infection?  Hepatitis B  If you have a higher risk for hepatitis B, you should be screened for this virus. Talk with your health care provider to find out if you are at risk for hepatitis B infection.  Hepatitis C  Blood testing is recommended for:  Everyone born from [birth year] through 1965.  Anyone  with known risk factors for hepatitis C.  Sexually transmitted infections (STIs)  You should be screened each year for STIs, including gonorrhea and chlamydia, if:  You are sexually active and are younger than 73 years of age.  You are older than 73 years of age and your health care provider tells you that you are at risk for this type of infection.  Your sexual activity has changed since you were last screened, and you are at increased risk for chlamydia or gonorrhea. Ask your health care provider if you are at risk.  Ask your health care provider about whether you are at high risk for HIV. Your health care provider  may recommend a prescription medicine to help prevent HIV infection. If you choose to take medicine to prevent HIV, you should first get tested for HIV. You should then be tested every 3 months for as long as you are taking the medicine.  Follow these instructions at home:  Alcohol use  Do not drink alcohol if your health care provider tells you not to drink.  If you drink alcohol:  Limit how much you have to 0-2 drinks a day.  Know how much alcohol is in your drink. In the [place] one drink equals one 12 oz bottle of beer (355 mL), one 5 oz glass of wine (148 mL), or one 1 oz glass of hard liquor (44 mL).  Lifestyle  Do not use any products that contain nicotine or tobacco. These products include cigarettes, chewing tobacco, and vaping devices, such as e-cigarettes. If you need help quitting, ask your health care provider.  Do not use street drugs.  Do not share needles.  Ask your health care provider for help if you need support or information about quitting drugs.  General instructions  Schedule regular health, dental, and eye exams.  Stay current with your vaccines.  Tell your health care provider if:  You often feel depressed.  You have ever been abused or do not feel safe at home.  Summary  Adopting a healthy lifestyle and getting preventive care are important in promoting health and wellness.  Follow your health care provider's instructions about healthy diet, exercising, and getting tested or screened for diseases.  Follow your health care provider's instructions on monitoring your cholesterol and blood pressure.  This information is not intended to replace advice given to you by your health care provider. Make sure you discuss any questions you have with your health care provider.  Document Revised: 05/06/2021 Document Reviewed: 05/06/2021  Elsevier Patient Education  2024 [employer].

## 2024-06-10 NOTE — Progress Notes (Unsigned)
 Subjective:  Patient ID: Corey [name], male    DOB: 02-23-1951  Age: 73 y.o. MRN: [id number]  CC: Medical Management of Chronic Issues (6 month follow up. Patient wants to discuss him moving to [place]  and getting his medication again because he has packed it up and cannot find it. ), Annual Exam, Hypertension, Hyperlipidemia, and Diabetes   HPI Corey [name] presents for a CPX and f/up -----  Discussed the use of AI scribe software for clinical note transcription with the patient, who gave verbal consent to proceed.  History of Present Illness   Corey [name] is a 73 year old male with hypertension who presents with concerns about blood pressure management.  Six months ago, during hyperbaric treatments, his blood pressure was noted to be elevated. He attributes this to stress and physical exertion from climbing six flights of stairs due to a non-functioning elevator. He has been on blood pressure medication for a long time but did not specify the medication name, dose, or frequency. He experiences shortness of breath when climbing stairs and walking his dog, especially on steep inclines. No chest pain, palpitations, or swelling in his legs or feet. He also denies headaches or blurred vision.   He has a history of abdominal pain for which he was prescribed Cymbalta . He attempted to wean off the medication by reducing the frequency of doses, but his abdominal pain worsened. He also takes Aleve in the morning to manage the pain.  He underwent hyperbaric oxygen  therapy for radiation-induced bladder damage, completing a second round of sixty sessions, which he stopped three months ago.  He is in the process of moving to [place]  to live with his daughter and is currently going through a divorce.       Outpatient Medications Prior to Visit  Medication Sig Dispense Refill   benazepril  (LOTENSIN ) 20 MG tablet TAKE 1 TABLET BY MOUTH EVERY DAY 90 tablet 0   DULoxetine  (CYMBALTA ) 60 MG capsule  TAKE 1 CAPSULE BY MOUTH EVERY DAY 90 capsule 0   Multiple Vitamin (MULTIVITAMIN WITH MINERALS) TABS tablet Take 1 tablet by mouth daily.     zinc  gluconate 50 MG tablet Take 1 tablet (50 mg total) by mouth daily. 90 tablet 1   amLODipine  (NORVASC ) 5 MG tablet Take 1 tablet (5 mg total) by mouth daily. 90 tablet 1   atorvastatin  (LIPITOR) 10 MG tablet Take 1 tablet (10 mg total) by mouth daily. 90 tablet 1   Ferric Maltol  (ACCRUFER ) 30 MG CAPS Take 1 capsule (30 mg total) by mouth in the morning and at bedtime. (Patient not taking: Reported on 06/10/2024) 90 capsule 1   No facility-administered medications prior to visit.    ROS Review of Systems  Constitutional:  Positive for unexpected weight change (wt gain). Negative for appetite change, chills, diaphoresis, fatigue and fever.  HENT: Negative.    Eyes:  Negative for visual disturbance.  Respiratory:  Positive for shortness of breath. Negative for cough, chest tightness and wheezing.   Cardiovascular:  Negative for chest pain, palpitations and leg swelling.  Gastrointestinal:  Negative for abdominal pain, constipation, diarrhea, nausea and vomiting.  Genitourinary: Negative.  Negative for difficulty urinating and dysuria.  Musculoskeletal: Negative.  Negative for arthralgias and myalgias.  Skin: Negative.   Neurological: Negative.  Negative for dizziness, weakness and light-headedness.  Hematological:  Negative for adenopathy. Does not bruise/bleed easily.  Psychiatric/Behavioral: Negative.      Objective:  BP 138/86 (BP Location: Left Arm, Patient  Position: Sitting, Cuff Size: Normal)   Pulse 66   Temp 97.9 F (36.6 C) (Oral)   Resp 16   Ht 5' 10 (1.778 m)   Wt 210 lb 12.8 oz (95.6 kg)   SpO2 99%   BMI 30.25 kg/m   BP Readings from Last 3 Encounters:  06/10/24 138/86  11/04/23 (!) 142/86  04/23/23 120/70    Wt Readings from Last 3 Encounters:  06/10/24 210 lb 12.8 oz (95.6 kg)  04/27/24 200 lb (90.7 kg)  11/04/23 206  lb 9.6 oz (93.7 kg)    Physical Exam Vitals reviewed.  Constitutional:      Appearance: Normal appearance.  HENT:     Nose: Nose normal.     Mouth/Throat:     Mouth: Mucous membranes are moist.   Eyes:     General: No scleral icterus.    Conjunctiva/sclera: Conjunctivae normal.    Cardiovascular:     Rate and Rhythm: Normal rate and regular rhythm.     Heart sounds: No murmur heard.    No friction rub. No gallop.     Comments: EKG--- NSR, 64 bpm No LVH, Q waves, or ST/T wave changes  Pulmonary:     Effort: Pulmonary effort is normal.     Breath sounds: No stridor. No wheezing, rhonchi or rales.  Abdominal:     General: Abdomen is flat.     Palpations: There is no mass.     Tenderness: There is no abdominal tenderness. There is no guarding.     Hernia: No hernia is present.   Musculoskeletal:        General: No swelling.     Cervical back: Neck supple.     Right lower leg: 1+ Pitting Edema present.     Left lower leg: 1+ Pitting Edema present.  Lymphadenopathy:     Cervical: No cervical adenopathy.   Skin:    General: Skin is warm and dry.     Coloration: Skin is not jaundiced.     Findings: No rash.   Neurological:     General: No focal deficit present.     Mental Status: He is alert.   Psychiatric:        Mood and Affect: Mood normal.        Behavior: Behavior normal.     Lab Results  Component Value Date   WBC 5.6 06/10/2024   HGB 13.6 06/10/2024   HCT 42.1 06/10/2024   PLT 217 06/10/2024   GLUCOSE 106 (H) 06/10/2024   CHOL 146 06/10/2024   TRIG 50 06/10/2024   HDL 68 06/10/2024   LDLCALC 66 06/10/2024   ALT 20 06/10/2024   AST 54 (H) 06/10/2024   NA 139 06/10/2024   K 4.1 06/10/2024   CL 105 06/10/2024   CREATININE 0.80 06/10/2024   BUN 17 06/10/2024   CO2 26 06/10/2024   TSH 1.69 06/10/2024   PSA 0.15 09/21/2023   INR 1.0 06/10/2024   HGBA1C 6.4 (H) 06/10/2024   MICROALBUR 68.4 (H) 11/04/2023    CT Abdomen Pelvis W Contrast Result  Date: 05/08/2022 CLINICAL DATA:  Lower abdominal pain x 10 days, intermittent hematuria EXAM: CT ABDOMEN AND PELVIS WITH CONTRAST TECHNIQUE: Multidetector CT imaging of the abdomen and pelvis was performed using the standard protocol following bolus administration of intravenous contrast. RADIATION DOSE REDUCTION: This exam was performed according to the departmental dose-optimization program which includes automated exposure control, adjustment of the mA and/or kV according to patient size and/or  use of iterative reconstruction technique. CONTRAST:  OMNIPAQUE  IOHEXOL  300 MG/ML  SOLN COMPARISON:  Radiograph of abdomen done on 05/07/2022 FINDINGS: Lower chest: There is no focal pulmonary consolidation in the lower lung fields. There is no pleural effusion. In image 6 of series 3, there is 4 mm faint nodule in the lingula. Hepatobiliary: No focal abnormality is seen. There is no dilation of bile ducts. Gallbladder is unremarkable. Pancreas: No focal abnormality is seen Spleen: Spleen measures 14.3 cm in maximum diameter. Adrenals/Urinary Tract: Adrenals are unremarkable. There is no hydronephrosis. There is 1.5 cm low-density structure in the anterior midportion of left kidney. Density measurements are higher than usual for simple cyst. There are no renal or ureteral stones. There is diffuse wall thickening in the urinary bladder. There are small pockets of air in the anterior aspect of the urinary bladder. Stomach/Bowel: Small hiatal hernia is seen. Small bowel loops are not dilated. Appendix is not dilated. There is no significant wall thickening in colon. Diverticulosis is seen in colon without signs of focal acute diverticulitis. Vascular/Lymphatic: Unremarkable. Reproductive: Prostate is not seen. There are coarse calcifications adjacent to the prostatic urethra. Prominence of prostatic urethra suggests possible previous transurethral resection of prostate. There are fluid-filled structures immediately  posterior to the rectus muscles in the suprapubic region possibly devices used for management of erectile dysfunction. Other: There is no ascites or pneumoperitoneum. Small umbilical hernia containing fat is seen. Small bilateral inguinal hernias containing fat are noted. Musculoskeletal: Degenerative changes are noted with disc space narrowing and encroachment of neural foramina at L5-S1 level. There is minimal anterolisthesis at L4-L5 level along with disc space narrowing. IMPRESSION: There is no evidence of intestinal obstruction or pneumoperitoneum. There is no hydronephrosis. Appendix is not dilated. There is diffuse wall thickening in the urinary bladder. This finding suggests possible chronic cystitis. There are small pockets of air in the anterior aspect of the urinary bladder. This may be due to cystitis or suggest recent catheterization. Enlarged spleen. Diverticulosis of colon without signs of focal diverticulitis. Other findings as described in the body of the report. Electronically Signed   By: Craven Do M.D.   On: 05/08/2022 11:55    Assessment & Plan:  Type 2 diabetes mellitus with hyperglycemia, without long-term current use of insulin (HCC)- BS is well controlled. -     Basic metabolic panel with GFR; Future -     Hemoglobin A1c; Future -     HM Diabetes Foot Exam  Essential hypertension, benign- BP is well controlled. -     CBC with Differential/Platelet; Future -     Basic metabolic panel with GFR; Future -     TSH; Future -     Hepatic function panel; Future -     amLODIPine  Besylate; Take 1 tablet (5 mg total) by mouth daily.  Dispense: 90 tablet; Refill: 0  Hyperlipidemia with target LDL less than 130 -     Lipid panel; Future -     TSH; Future -     Hepatic function panel; Future -     Atorvastatin  Calcium ; Take 1 tablet (10 mg total) by mouth daily.  Dispense: 90 tablet; Refill: 0  LFTs abnormal -     Protime-INR; Future  DOE (dyspnea on exertion)- EKG is  reassuring. D-dimer is mildly elevated. I have recommended a CV work-up when he gets to [place] . -     Brain natriuretic peptide; Future -     D-dimer, quantitative; Future -  Troponin T, High Sensitivity (hs-TnT); Future  Encounter for general adult medical examination with abnormal findings - Exam completed, labs reviewed, vaccines reviewed and updated, cancer screenings are UTD, pt ed material was given.   Other orders -     Brain natriuretic peptide -     CBC with Differential/Platelet     Follow-up: Return in about 6 months (around 12/10/2024).  Sandra Crouch, MD

## 2024-06-11 ENCOUNTER — Other Ambulatory Visit: Payer: Self-pay | Admitting: Internal Medicine

## 2024-06-11 DIAGNOSIS — E785 Hyperlipidemia, unspecified: Secondary | ICD-10-CM

## 2024-06-13 ENCOUNTER — Ambulatory Visit: Payer: Self-pay | Admitting: Internal Medicine

## 2024-06-13 DIAGNOSIS — Z23 Encounter for immunization: Secondary | ICD-10-CM | POA: Insufficient documentation

## 2024-06-13 DIAGNOSIS — H353113 Nonexudative age-related macular degeneration, right eye, advanced atrophic without subfoveal involvement: Secondary | ICD-10-CM | POA: Diagnosis not present

## 2024-06-13 DIAGNOSIS — H353221 Exudative age-related macular degeneration, left eye, with active choroidal neovascularization: Secondary | ICD-10-CM | POA: Diagnosis not present

## 2024-06-13 DIAGNOSIS — H43821 Vitreomacular adhesion, right eye: Secondary | ICD-10-CM | POA: Diagnosis not present

## 2024-06-13 DIAGNOSIS — H43812 Vitreous degeneration, left eye: Secondary | ICD-10-CM | POA: Diagnosis not present

## 2024-06-13 DIAGNOSIS — H35033 Hypertensive retinopathy, bilateral: Secondary | ICD-10-CM | POA: Diagnosis not present

## 2024-06-13 LAB — HEPATIC FUNCTION PANEL
AG Ratio: 1.6 (calc) (ref 1.0–2.5)
ALT: 20 U/L (ref 9–46)
AST: 54 U/L — ABNORMAL HIGH (ref 10–35)
Albumin: 4.3 g/dL (ref 3.6–5.1)
Alkaline phosphatase (APISO): 89 U/L (ref 35–144)
Bilirubin, Direct: 0.1 mg/dL (ref 0.0–0.2)
Globulin: 2.7 g/dL (ref 1.9–3.7)
Indirect Bilirubin: 0.5 mg/dL (ref 0.2–1.2)
Total Bilirubin: 0.6 mg/dL (ref 0.2–1.2)
Total Protein: 7 g/dL (ref 6.1–8.1)

## 2024-06-13 LAB — CBC WITH DIFFERENTIAL/PLATELET
Absolute Lymphocytes: 734 {cells}/uL — ABNORMAL LOW (ref 850–3900)
Absolute Monocytes: 526 {cells}/uL (ref 200–950)
Basophils Absolute: 22 {cells}/uL (ref 0–200)
Basophils Relative: 0.4 %
Eosinophils Absolute: 90 {cells}/uL (ref 15–500)
Eosinophils Relative: 1.6 %
HCT: 42.1 % (ref 38.5–50.0)
Hemoglobin: 13.6 g/dL (ref 13.2–17.1)
MCH: 26.4 pg — ABNORMAL LOW (ref 27.0–33.0)
MCHC: 32.3 g/dL (ref 32.0–36.0)
MCV: 81.6 fL (ref 80.0–100.0)
MPV: 11.4 fL (ref 7.5–12.5)
Monocytes Relative: 9.4 %
Neutro Abs: 4228 {cells}/uL (ref 1500–7800)
Neutrophils Relative %: 75.5 %
Platelets: 217 10*3/uL (ref 140–400)
RBC: 5.16 10*6/uL (ref 4.20–5.80)
RDW: 13.7 % (ref 11.0–15.0)
Total Lymphocyte: 13.1 %
WBC: 5.6 10*3/uL (ref 3.8–10.8)

## 2024-06-13 LAB — BASIC METABOLIC PANEL WITH GFR
BUN: 17 mg/dL (ref 7–25)
CO2: 26 mmol/L (ref 20–32)
Calcium: 8.9 mg/dL (ref 8.6–10.3)
Chloride: 105 mmol/L (ref 98–110)
Creat: 0.8 mg/dL (ref 0.70–1.28)
Glucose, Bld: 106 mg/dL — ABNORMAL HIGH (ref 65–99)
Potassium: 4.1 mmol/L (ref 3.5–5.3)
Sodium: 139 mmol/L (ref 135–146)
eGFR: 94 mL/min/{1.73_m2} (ref >=60)

## 2024-06-13 LAB — D-DIMER, QUANTITATIVE: D-Dimer, Quant: 0.53 ug{FEU}/mL — ABNORMAL HIGH (ref <0.50)

## 2024-06-13 LAB — LIPID PANEL
Cholesterol: 146 mg/dL (ref <200)
HDL: 68 mg/dL (ref >=40)
LDL Cholesterol (Calc): 66 mg/dL
Non-HDL Cholesterol (Calc): 78 mg/dL (ref <130)
Total CHOL/HDL Ratio: 2.1 (calc) (ref <5.0)
Triglycerides: 50 mg/dL (ref <150)

## 2024-06-13 LAB — PROTIME-INR
INR: 1
Prothrombin Time: 10.7 s (ref 9.0–11.5)

## 2024-06-13 LAB — HEMOGLOBIN A1C
Hgb A1c MFr Bld: 6.4 % — ABNORMAL HIGH (ref <5.7)
Mean Plasma Glucose: 137 mg/dL
eAG (mmol/L): 7.6 mmol/L

## 2024-06-13 LAB — TROPONIN T, HIGH SENSITIVITY (HS-TNT): Troponin T (Highly Sensitive): 16 ng/L (ref <23)

## 2024-06-13 LAB — BRAIN NATRIURETIC PEPTIDE: Brain Natriuretic Peptide: 34 pg/mL (ref <100)

## 2024-06-13 LAB — TSH: TSH: 1.69 m[IU]/L (ref 0.40–4.50)

## 2024-06-13 MED ORDER — ATORVASTATIN CALCIUM 10 MG PO TABS
10.0000 mg | ORAL_TABLET | Freq: Every day | ORAL | 0 refills | Status: AC
Start: 2024-06-13 — End: ?

## 2024-06-13 MED ORDER — BOOSTRIX 5-2.5-18.5 LF-MCG/0.5 IM SUSP: 0.5000 mL | Freq: Once | INTRAMUSCULAR | 0 refills | Status: AC

## 2024-06-13 MED ORDER — AMLODIPINE BESYLATE 5 MG PO TABS
5.0000 mg | ORAL_TABLET | Freq: Every day | ORAL | 0 refills | Status: AC
Start: 2024-06-13 — End: ?

## 2024-06-14 DIAGNOSIS — F0634 Mood disorder due to known physiological condition with mixed features: Secondary | ICD-10-CM | POA: Diagnosis not present

## 2024-06-14 DIAGNOSIS — R419 Unspecified symptoms and signs involving cognitive functions and awareness: Secondary | ICD-10-CM | POA: Diagnosis not present

## 2024-06-15 DIAGNOSIS — R419 Unspecified symptoms and signs involving cognitive functions and awareness: Secondary | ICD-10-CM | POA: Diagnosis not present

## 2024-06-15 DIAGNOSIS — F0634 Mood disorder due to known physiological condition with mixed features: Secondary | ICD-10-CM | POA: Diagnosis not present

## 2024-06-15 NOTE — Addendum Note (Signed)
 Addended by: Jeziah Kretschmer , Desta Bujak M on: 06/15/2024 02:52 PM   Modules accepted: Orders

## 2024-06-17 DIAGNOSIS — F0634 Mood disorder due to known physiological condition with mixed features: Secondary | ICD-10-CM | POA: Diagnosis not present

## 2024-06-17 DIAGNOSIS — R419 Unspecified symptoms and signs involving cognitive functions and awareness: Secondary | ICD-10-CM | POA: Diagnosis not present

## 2024-08-03 ENCOUNTER — Other Ambulatory Visit: Payer: Self-pay | Admitting: Internal Medicine

## 2024-08-03 DIAGNOSIS — I10 Essential (primary) hypertension: Secondary | ICD-10-CM

## 2024-08-04 NOTE — Telephone Encounter (Signed)
 Last OV 06/10/24 Next OV not scheduled  Last refill 05/09/24 Qty #90/0
